# Patient Record
Sex: Male | Born: 1960
Health system: Southern US, Community
[De-identification: ages and names within clinical notes are randomized; demographics above are authoritative.]

## PROBLEM LIST (undated history)

## (undated) DIAGNOSIS — R7303 Prediabetes: Secondary | ICD-10-CM

## (undated) DIAGNOSIS — K219 Gastro-esophageal reflux disease without esophagitis: Secondary | ICD-10-CM

## (undated) DIAGNOSIS — I10 Essential (primary) hypertension: Secondary | ICD-10-CM

## (undated) DIAGNOSIS — N400 Enlarged prostate without lower urinary tract symptoms: Secondary | ICD-10-CM

## (undated) DIAGNOSIS — I517 Cardiomegaly: Secondary | ICD-10-CM

## (undated) DIAGNOSIS — S83419A Sprain of medial collateral ligament of unspecified knee, initial encounter: Secondary | ICD-10-CM

## (undated) DIAGNOSIS — E039 Hypothyroidism, unspecified: Secondary | ICD-10-CM

## (undated) HISTORY — DX: Sprain of medial collateral ligament of unspecified knee, initial encounter: S83.419A

---

## 1997-03-07 HISTORY — PX: MEDIAL COLLATERAL LIGAMENT REPAIR, KNEE: SHX2019

## 2005-02-18 ENCOUNTER — Emergency Department: Payer: Self-pay | Admitting: Emergency Medicine

## 2005-09-30 ENCOUNTER — Emergency Department: Payer: Self-pay | Admitting: Unknown Physician Specialty

## 2006-10-05 ENCOUNTER — Ambulatory Visit: Payer: Self-pay | Admitting: Family Medicine

## 2014-05-06 ENCOUNTER — Ambulatory Visit: Payer: Self-pay | Admitting: Physician Assistant

## 2014-07-01 ENCOUNTER — Other Ambulatory Visit: Payer: Self-pay | Admitting: Physician Assistant

## 2014-07-01 DIAGNOSIS — M25511 Pain in right shoulder: Secondary | ICD-10-CM

## 2014-07-09 ENCOUNTER — Ambulatory Visit
Admission: RE | Admit: 2014-07-09 | Discharge: 2014-07-09 | Disposition: A | Discharge status: Home / Self Care | Payer: Managed Care, Other (non HMO) | Source: Ambulatory Visit | Attending: Physician Assistant | Admitting: Physician Assistant

## 2014-07-09 DIAGNOSIS — M75121 Complete rotator cuff tear or rupture of right shoulder, not specified as traumatic: Secondary | ICD-10-CM | POA: Diagnosis not present

## 2014-07-09 DIAGNOSIS — M25511 Pain in right shoulder: Secondary | ICD-10-CM | POA: Diagnosis present

## 2014-07-25 NOTE — Discharge Instructions (Signed)

## 2014-07-28 DIAGNOSIS — Z791 Long term (current) use of non-steroidal anti-inflammatories (NSAID): Secondary | ICD-10-CM | POA: Diagnosis not present

## 2014-07-28 DIAGNOSIS — K640 First degree hemorrhoids: Secondary | ICD-10-CM | POA: Diagnosis not present

## 2014-07-28 DIAGNOSIS — I10 Essential (primary) hypertension: Secondary | ICD-10-CM | POA: Diagnosis not present

## 2014-07-28 DIAGNOSIS — E039 Hypothyroidism, unspecified: Secondary | ICD-10-CM | POA: Diagnosis not present

## 2014-07-28 DIAGNOSIS — F17200 Nicotine dependence, unspecified, uncomplicated: Secondary | ICD-10-CM | POA: Diagnosis not present

## 2014-07-28 DIAGNOSIS — N400 Enlarged prostate without lower urinary tract symptoms: Secondary | ICD-10-CM | POA: Diagnosis not present

## 2014-07-28 DIAGNOSIS — Z79899 Other long term (current) drug therapy: Secondary | ICD-10-CM | POA: Diagnosis not present

## 2014-07-28 DIAGNOSIS — Z1211 Encounter for screening for malignant neoplasm of colon: Secondary | ICD-10-CM | POA: Diagnosis not present

## 2014-08-08 ENCOUNTER — Ambulatory Visit: Payer: Managed Care, Other (non HMO) | Admitting: Student in an Organized Health Care Education/Training Program

## 2014-08-08 ENCOUNTER — Encounter
Admission: RE | Disposition: A | Discharge status: Home / Self Care | Payer: Self-pay | Source: Ambulatory Visit | Attending: Gastroenterology

## 2014-08-08 ENCOUNTER — Ambulatory Visit
Admission: RE | Admit: 2014-08-08 | Discharge: 2014-08-08 | Disposition: A | Discharge status: Home / Self Care | Payer: Managed Care, Other (non HMO) | Source: Ambulatory Visit | Attending: Gastroenterology | Admitting: Gastroenterology

## 2014-08-08 DIAGNOSIS — K640 First degree hemorrhoids: Secondary | ICD-10-CM | POA: Insufficient documentation

## 2014-08-08 DIAGNOSIS — Z1211 Encounter for screening for malignant neoplasm of colon: Secondary | ICD-10-CM | POA: Diagnosis not present

## 2014-08-08 DIAGNOSIS — F17200 Nicotine dependence, unspecified, uncomplicated: Secondary | ICD-10-CM | POA: Insufficient documentation

## 2014-08-08 DIAGNOSIS — Z791 Long term (current) use of non-steroidal anti-inflammatories (NSAID): Secondary | ICD-10-CM | POA: Insufficient documentation

## 2014-08-08 DIAGNOSIS — Z79899 Other long term (current) drug therapy: Secondary | ICD-10-CM | POA: Insufficient documentation

## 2014-08-08 DIAGNOSIS — E039 Hypothyroidism, unspecified: Secondary | ICD-10-CM | POA: Insufficient documentation

## 2014-08-08 DIAGNOSIS — N400 Enlarged prostate without lower urinary tract symptoms: Secondary | ICD-10-CM | POA: Insufficient documentation

## 2014-08-08 DIAGNOSIS — I10 Essential (primary) hypertension: Secondary | ICD-10-CM | POA: Insufficient documentation

## 2014-08-08 HISTORY — DX: Benign prostatic hyperplasia without lower urinary tract symptoms: N40.0

## 2014-08-08 HISTORY — PX: COLONOSCOPY: SHX5424

## 2014-08-08 HISTORY — DX: Hypothyroidism, unspecified: E03.9

## 2014-08-08 HISTORY — DX: Essential (primary) hypertension: I10

## 2014-08-08 SURGERY — COLONOSCOPY
Anesthesia: Monitor Anesthesia Care | Wound class: Contaminated

## 2014-08-08 MED ORDER — LIDOCAINE HCL (CARDIAC) 20 MG/ML IV SOLN
INTRAVENOUS | Status: DC | PRN
  Administered 2014-08-08 (×2): 30 mg via INTRAVENOUS

## 2014-08-08 MED ORDER — ACETAMINOPHEN 160 MG/5ML PO SOLN: 325.0000 mg | ORAL | Status: DC | PRN

## 2014-08-08 MED ORDER — ACETAMINOPHEN 325 MG PO TABS: 325.0000 mg | ORAL_TABLET | ORAL | Status: DC | PRN

## 2014-08-08 MED ORDER — LACTATED RINGERS IV SOLN
INTRAVENOUS | Status: DC
  Administered 2014-08-08 (×2): via INTRAVENOUS

## 2014-08-08 MED ORDER — PROPOFOL 10 MG/ML IV BOLUS
INTRAVENOUS | Status: DC | PRN
  Administered 2014-08-08 (×10): 20 mg via INTRAVENOUS

## 2014-08-08 MED ORDER — STERILE WATER FOR IRRIGATION IR SOLN
Status: DC | PRN
  Administered 2014-08-08: 10:00:00

## 2014-08-08 SURGICAL SUPPLY — 28 items

## 2014-08-08 NOTE — Op Note (Signed)
[facility] Patient Name: Todd Burns Procedure Date: 08/08/2014 10:03 AM MRN: [id number] Account #: [id number] Date of Birth: 08-16-1960 Admit Type: Outpatient Age: 54 Room: [facility] OR ROOM 01 Gender: Male Note Status: Finalized Procedure:         Colonoscopy Indications:       Screening for colorectal malignant neoplasm Providers:         Lucilla Lame, MD Medicines:         Propofol per Anesthesia Complications:     No immediate complications. Procedure:         Pre-Anesthesia Assessment:                    - Prior to the procedure, a History and Physical was                     performed, and patient medications and allergies were                     reviewed. The patient's tolerance of previous anesthesia                     was also reviewed. The risks and benefits of the procedure                     and the sedation options and risks were discussed with the                     patient. All questions were answered, and informed consent                     was obtained. Prior Anticoagulants: The patient has taken                     no previous anticoagulant or antiplatelet agents. ASA                     Grade Assessment: II - A patient with mild systemic                     disease. After reviewing the risks and benefits, the                     patient was deemed in satisfactory condition to undergo                     the procedure.                    After obtaining informed consent, the colonoscope was                     passed under direct vision. Throughout the procedure, the                     patient's blood pressure, pulse, and oxygen saturations                     were monitored continuously. The was introduced through                     the anus and advanced to the the cecum, identified by                     appendiceal orifice and ileocecal valve. The  colonoscopy                     was performed without difficulty. The patient  tolerated                     the procedure well. The quality of the bowel preparation                     was excellent. Findings:      The perianal and digital rectal examinations were normal.      Non-bleeding internal hemorrhoids were found during retroflexion. The       hemorrhoids were Grade I (internal hemorrhoids that do not prolapse). Impression:        - Non-bleeding internal hemorrhoids.                    - No specimens collected. Recommendation:    - Repeat colonoscopy in 10 years for screening unless any                     change in family history or lower GI problems. Procedure Code(s): --- Professional ---                    [phone], Colonoscopy, flexible; diagnostic, including                     collection of specimen(s) by brushing or washing, when                     performed (separate procedure) Diagnosis Code(s): --- Professional ---                    Z12.11, Encounter for screening for malignant neoplasm of                     colon CPT copyright 2014 American Medical Association. All rights reserved. The codes documented in this report are preliminary and upon coder review may  be revised to meet current compliance requirements. Lucilla Lame, MD 08/08/2014 10:18:27 AM This report has been signed electronically. Number of Addenda: 0 Note Initiated On: 08/08/2014 10:03 AM Scope Withdrawal Time: 0 hours 6 minutes 37 seconds  Total Procedure Duration: 0 hours 9 minutes 37 seconds       [facility]

## 2014-08-08 NOTE — Anesthesia Postprocedure Evaluation (Signed)
  Anesthesia Post-op Note  Patient: Todd Burns  Procedure(s) Performed: Procedure(s): COLONOSCOPY (N/A)  Anesthesia type:MAC  Patient location: PACU  Post pain: Pain level controlled  Post assessment: Post-op Vital signs reviewed, Patient's Cardiovascular Status Stable, Respiratory Function Stable, Patent Airway and No signs of Nausea or vomiting  Post vital signs: Reviewed and stable  Last Vitals:  Filed Vitals:   08/08/14 1020  BP:   Pulse: 73  Temp: 36.2 C  Resp:     Level of consciousness: awake, alert  and patient cooperative  Complications: No apparent anesthesia complications

## 2014-08-08 NOTE — H&P (Signed)
  [facility]  [address] [place], [place] [address] Phone: [phone] Fax : [phone]  Primary Care Physician:  Christie Nottingham., MD Primary Gastroenterologist:  Dr. Allen Norris  Pre-Procedure History & Physical: HPI:  Todd Burns is a 54 y.o. male is here for a screening colonoscopy.   Past Medical History  Diagnosis Date  . Hypertension   . Hypothyroidism   . BPH (benign prostatic hyperplasia)     Past Surgical History  Procedure Laterality Date  . Medial collateral ligament repair, knee  1999    Prior to Admission medications   Medication Sig Start Date End Date Taking? Authorizing Provider  amLODipine (NORVASC) 5 MG tablet Take 5 mg by mouth every morning.   Yes Historical Provider, MD  atorvastatin (LIPITOR) 20 MG tablet Take 20 mg by mouth daily. PM   Yes Historical Provider, MD  finasteride (PROSCAR) 5 MG tablet Take 5 mg by mouth daily.   Yes Historical Provider, MD  fluticasone (FLONASE) 50 MCG/ACT nasal spray Place 1 spray into both nostrils 2 (two) times daily.   Yes Historical Provider, MD  ibuprofen (ADVIL,MOTRIN) 800 MG tablet Take 800 mg by mouth every 8 (eight) hours as needed.   Yes Historical Provider, MD  levothyroxine (SYNTHROID, LEVOTHROID) 175 MCG tablet Take 175 mcg by mouth one time only at 6 PM. AM   Yes Historical Provider, MD  losartan-hydrochlorothiazide (HYZAAR) 100-25 MG per tablet Take 1 tablet by mouth daily at 6 PM.    Yes Historical Provider, MD  tamsulosin (FLOMAX) 0.4 MG CAPS capsule Take 0.4 mg by mouth daily at 6 PM.    Yes Historical Provider, MD    Allergies as of 07/16/2014  . (Not on File)    History reviewed. No pertinent family history.  History   Social History  . Marital Status: Married    Spouse Name: N/A  . Number of Children: N/A  . Years of Education: N/A   Occupational History  . Not on file.   Social History Main Topics  . Smoking status: Current Every Day Smoker -- 1.00 packs/day for 15  years  . Smokeless tobacco: Not on file  . Alcohol Use: Yes  . Drug Use: Not on file  . Sexual Activity: Yes   Other Topics Concern  . Not on file   Social History Narrative    Review of Systems: See HPI, otherwise negative ROS  Physical Exam: BP 144/84 mmHg  Pulse 60  Temp(Src) 97.9 F (36.6 C) (Temporal)  Resp 16  Ht 5\' 7"  (1.702 m)  Wt 211 lb (95.709 kg)  BMI 33.04 kg/m2  SpO2 100% General:   Alert,  pleasant and cooperative in NAD Head:  Normocephalic and atraumatic. Neck:  Supple; no masses or thyromegaly. Lungs:  Clear throughout to auscultation.    Heart:  Regular rate and rhythm. Abdomen:  Soft, nontender and nondistended. Normal bowel sounds, without guarding, and without rebound.   Neurologic:  Alert and  oriented x4;  grossly normal neurologically.  Impression/Plan: Todd Burns is now here to undergo a screening colonoscopy.  Risks, benefits, and alternatives regarding colonoscopy have been reviewed with the patient.  Questions have been answered.  All parties agreeable.

## 2014-08-08 NOTE — Transfer of Care (Signed)
Immediate Anesthesia Transfer of Care Note  Patient: Todd Burns  Procedure(s) Performed: Procedure(s): COLONOSCOPY (N/A)  Patient Location: PACU  Anesthesia Type: MAC  Level of Consciousness: awake, alert  and patient cooperative  Airway and Oxygen Therapy: Patient Spontanous Breathing and Patient connected to supplemental oxygen  Post-op Assessment: Post-op Vital signs reviewed, Patient's Cardiovascular Status Stable, Respiratory Function Stable, Patent Airway and No signs of Nausea or vomiting  Post-op Vital Signs: Reviewed and stable  Complications: No apparent anesthesia complications

## 2014-08-08 NOTE — Anesthesia Preprocedure Evaluation (Signed)
Anesthesia Evaluation  Patient identified by MRN, date of birth, ID band  Reviewed: Allergy & Precautions, H&P , NPO status , Patient's Chart, lab work & pertinent test results  Airway Mallampati: II  TM Distance: >3 FB Neck ROM: full    Dental  (+) Missing, Poor Dentition   Pulmonary Current Smoker,    Pulmonary exam normal       Cardiovascular hypertension, Rhythm:regular Rate:Normal     Neuro/Psych    GI/Hepatic   Endo/Other  Hypothyroidism   Renal/GU      Musculoskeletal   Abdominal   Peds  Hematology   Anesthesia Other Findings   Reproductive/Obstetrics                             Anesthesia Physical Anesthesia Plan  ASA: II  Anesthesia Plan: MAC   Post-op Pain Management:    Induction:   Airway Management Planned:   Additional Equipment:   Intra-op Plan:   Post-operative Plan:   Informed Consent: I have reviewed the patients History and Physical, chart, labs and discussed the procedure including the risks, benefits and alternatives for the proposed anesthesia with the patient or authorized representative who has indicated his/her understanding and acceptance.     Plan Discussed with: CRNA  Anesthesia Plan Comments:         Anesthesia Quick Evaluation

## 2014-08-11 ENCOUNTER — Encounter: Payer: Self-pay | Admitting: Gastroenterology

## 2014-09-22 ENCOUNTER — Other Ambulatory Visit: Payer: Managed Care, Other (non HMO)

## 2014-09-22 NOTE — Patient Instructions (Signed)
  Your procedure is scheduled on: 10-01-14 Report to [facility] To find out your arrival time please call [phone] between 1PM - 3PM on 09-30-14 (TUESDAY)  Remember: Instructions that are not followed completely may result in serious medical risk, up to and including death, or upon the discretion of your surgeon and anesthesiologist your surgery may need to be rescheduled.    _X___ 1. Do not eat food or drink liquids after midnight. No gum chewing or hard candies.     _X___ 2. No Alcohol for 24 hours before or after surgery.   ____ 3. Bring all medications with you on the day of surgery if instructed.    _X___ 4. Notify your doctor if there is any change in your medical condition     (cold, fever, infections).     Do not wear jewelry, make-up, hairpins, clips or nail polish.  Do not wear lotions, powders, or perfumes. You may wear deodorant.  Do not shave 48 hours prior to surgery. Men may shave face and neck.  Do not bring valuables to the hospital.    [facility] is not responsible for any belongings or valuables.               Contacts, dentures or bridgework may not be worn into surgery.  Leave your suitcase in the car. After surgery it may be brought to your room.  For patients admitted to the hospital, discharge time is determined by your  treatment team.   Patients discharged the day of surgery will not be allowed to drive home.   Please read over the following fact sheets that you were given:    __X__ Take these medicines the morning of surgery with A SIP OF WATER:    1. AMLODIPINE  2. ATORVASTATIN  3.   4.  5.  6.  ____ Fleet Enema (as directed)   _X___ Use CHG Soap as directed  ____ Use inhalers on the day of surgery  ____ Stop metformin 2 days prior to surgery    ____ Take 1/2 of usual insulin dose the night before surgery and none on the morning of surgery.   ____ Stop Coumadin/Plavix/aspirin-N/A  _X___ Stop  Anti-inflammatories-STOP IBUPROFEN 7 DAYS PRIOR TO SURGERY- NO NSAIDS OR ASPIRIN PRODUCTS-TYLENOL OK   ____ Stop supplements until after surgery.    ____ Bring C-Pap to the hospital.

## 2014-09-23 ENCOUNTER — Encounter
Admission: RE | Admit: 2014-09-23 | Discharge: 2014-09-23 | Disposition: A | Discharge status: Home / Self Care | Payer: Managed Care, Other (non HMO) | Source: Ambulatory Visit | Attending: Anesthesiology | Admitting: Anesthesiology

## 2014-09-23 DIAGNOSIS — Z01810 Encounter for preprocedural cardiovascular examination: Secondary | ICD-10-CM | POA: Insufficient documentation

## 2014-09-23 LAB — POTASSIUM: Potassium: 3.3 mmol/L — ABNORMAL LOW (ref 3.5–5.1)

## 2014-09-26 NOTE — Pre-Procedure Instructions (Signed)
Spoke with dr Andree Elk about ekg-cardiac claearance requested.  Got pt appt at [facility] with dr Neoma Laming for 09-29-14 @ 10:15. Pt notified of this. Pt also with a K+ of 3.3. Faxed to dr Sanjuan Dame office and left message with tabitha

## 2014-09-29 ENCOUNTER — Encounter: Payer: Self-pay | Admitting: Internal Medicine

## 2014-09-30 ENCOUNTER — Encounter: Payer: Self-pay | Admitting: Internal Medicine

## 2014-10-01 ENCOUNTER — Encounter
Admission: RE | Disposition: A | Discharge status: Home / Self Care | Payer: Self-pay | Source: Ambulatory Visit | Attending: Specialist

## 2014-10-01 ENCOUNTER — Ambulatory Visit: Payer: Managed Care, Other (non HMO) | Admitting: Anesthesiology

## 2014-10-01 ENCOUNTER — Ambulatory Visit
Admission: RE | Admit: 2014-10-01 | Discharge: 2014-10-01 | Disposition: A | Discharge status: Home / Self Care | Payer: Managed Care, Other (non HMO) | Source: Ambulatory Visit | Attending: Specialist | Admitting: Specialist

## 2014-10-01 DIAGNOSIS — M7551 Bursitis of right shoulder: Secondary | ICD-10-CM | POA: Insufficient documentation

## 2014-10-01 DIAGNOSIS — F17200 Nicotine dependence, unspecified, uncomplicated: Secondary | ICD-10-CM | POA: Diagnosis not present

## 2014-10-01 DIAGNOSIS — M7541 Impingement syndrome of right shoulder: Secondary | ICD-10-CM | POA: Insufficient documentation

## 2014-10-01 DIAGNOSIS — I10 Essential (primary) hypertension: Secondary | ICD-10-CM | POA: Diagnosis not present

## 2014-10-01 DIAGNOSIS — Z79899 Other long term (current) drug therapy: Secondary | ICD-10-CM | POA: Insufficient documentation

## 2014-10-01 DIAGNOSIS — Z9889 Other specified postprocedural states: Secondary | ICD-10-CM

## 2014-10-01 DIAGNOSIS — M75101 Unspecified rotator cuff tear or rupture of right shoulder, not specified as traumatic: Secondary | ICD-10-CM | POA: Diagnosis present

## 2014-10-01 DIAGNOSIS — M75121 Complete rotator cuff tear or rupture of right shoulder, not specified as traumatic: Secondary | ICD-10-CM | POA: Insufficient documentation

## 2014-10-01 DIAGNOSIS — E039 Hypothyroidism, unspecified: Secondary | ICD-10-CM | POA: Insufficient documentation

## 2014-10-01 DIAGNOSIS — M1990 Unspecified osteoarthritis, unspecified site: Secondary | ICD-10-CM | POA: Diagnosis not present

## 2014-10-01 HISTORY — PX: SHOULDER ARTHROSCOPY WITH ROTATOR CUFF REPAIR: SHX5685

## 2014-10-01 LAB — POTASSIUM: Potassium: 3.6 mmol/L (ref 3.5–5.1)

## 2014-10-01 SURGERY — ARTHROSCOPY, SHOULDER, WITH ROTATOR CUFF REPAIR
Anesthesia: General | Site: Shoulder | Laterality: Right | Wound class: Clean

## 2014-10-01 MED ORDER — ROPIVACAINE HCL 2 MG/ML IJ SOLN
INTRAMUSCULAR | Status: AC
  Filled 2014-10-01: qty 40

## 2014-10-01 MED ORDER — PHENYLEPHRINE HCL 10 MG/ML IJ SOLN
INTRAMUSCULAR | Status: DC | PRN
  Administered 2014-10-01: 200 ug via INTRAVENOUS
  Administered 2014-10-01: 100 ug via INTRAVENOUS

## 2014-10-01 MED ORDER — ONDANSETRON HCL 4 MG/2ML IJ SOLN: 4.0000 mg | Freq: Once | INTRAMUSCULAR | Status: DC | PRN

## 2014-10-01 MED ORDER — CEFAZOLIN SODIUM-DEXTROSE 2-3 GM-% IV SOLR
2.0000 g | Freq: Once | INTRAVENOUS | Status: AC
  Administered 2014-10-01: 2 g via INTRAVENOUS

## 2014-10-01 MED ORDER — GABAPENTIN 400 MG PO CAPS: 400.0000 mg | ORAL_CAPSULE | Freq: Three times a day (TID) | ORAL | Status: DC

## 2014-10-01 MED ORDER — GLYCOPYRROLATE 0.2 MG/ML IJ SOLN
INTRAMUSCULAR | Status: DC | PRN
  Administered 2014-10-01 (×2): 0.6 mg via INTRAVENOUS

## 2014-10-01 MED ORDER — MORPHINE SULFATE 4 MG/ML IJ SOLN
INTRAMUSCULAR | Status: AC
  Filled 2014-10-01: qty 1

## 2014-10-01 MED ORDER — LIDOCAINE HCL (PF) 1 % IJ SOLN
INTRAMUSCULAR | Status: AC
  Filled 2014-10-01: qty 2

## 2014-10-01 MED ORDER — BUPIVACAINE-EPINEPHRINE (PF) 0.5% -1:200000 IJ SOLN
INTRAMUSCULAR | Status: AC
  Filled 2014-10-01: qty 30

## 2014-10-01 MED ORDER — FENTANYL CITRATE (PF) 100 MCG/2ML IJ SOLN: 25.0000 ug | INTRAMUSCULAR | Status: DC | PRN

## 2014-10-01 MED ORDER — HYDROMORPHONE HCL 1 MG/ML IJ SOLN: 0.2500 mg | INTRAMUSCULAR | Status: DC | PRN

## 2014-10-01 MED ORDER — GABAPENTIN 400 MG PO CAPS
400.0000 mg | ORAL_CAPSULE | Freq: Once | ORAL | Status: AC
  Administered 2014-10-01: 400 mg via ORAL

## 2014-10-01 MED ORDER — FAMOTIDINE 20 MG PO TABS
20.0000 mg | ORAL_TABLET | Freq: Once | ORAL | Status: AC
  Administered 2014-10-01: 20 mg via ORAL

## 2014-10-01 MED ORDER — NEOSTIGMINE METHYLSULFATE 10 MG/10ML IV SOLN
INTRAVENOUS | Status: DC | PRN
Start: 2014-10-01 — End: 2014-10-01
  Administered 2014-10-01: 3 mg via INTRAVENOUS

## 2014-10-01 MED ORDER — LACTATED RINGERS IV SOLN
INTRAVENOUS | Status: DC
  Administered 2014-10-01 (×2): via INTRAVENOUS

## 2014-10-01 MED ORDER — MIDAZOLAM HCL 2 MG/2ML IJ SOLN
INTRAMUSCULAR | Status: DC | PRN
  Administered 2014-10-01 (×2): 1 mg via INTRAVENOUS

## 2014-10-01 MED ORDER — ONDANSETRON HCL 4 MG/2ML IJ SOLN
INTRAMUSCULAR | Status: DC | PRN
  Administered 2014-10-01: 4 mg via INTRAVENOUS

## 2014-10-01 MED ORDER — BUPIVACAINE-EPINEPHRINE (PF) 0.25% -1:200000 IJ SOLN
INTRAMUSCULAR | Status: DC | PRN
  Administered 2014-10-01: 30 mL via PERINEURAL

## 2014-10-01 MED ORDER — CLOPIDOGREL BISULFATE 75 MG PO TABS: 75.0000 mg | ORAL_TABLET | Freq: Every day | ORAL | Status: DC

## 2014-10-01 MED ORDER — NEOMYCIN-POLYMYXIN B GU 40-200000 IR SOLN
Status: AC
  Filled 2014-10-01: qty 2

## 2014-10-01 MED ORDER — ROCURONIUM BROMIDE 100 MG/10ML IV SOLN
INTRAVENOUS | Status: DC | PRN
  Administered 2014-10-01: 50 mg via INTRAVENOUS

## 2014-10-01 MED ORDER — NEOMYCIN-POLYMYXIN B GU 40-200000 IR SOLN
Status: DC | PRN
  Administered 2014-10-01: 6 mL

## 2014-10-01 MED ORDER — MELOXICAM 7.5 MG PO TABS
ORAL_TABLET | ORAL | Status: AC
  Filled 2014-10-01: qty 2

## 2014-10-01 MED ORDER — FENTANYL CITRATE (PF) 100 MCG/2ML IJ SOLN
INTRAMUSCULAR | Status: DC | PRN
  Administered 2014-10-01 (×2): 50 ug via INTRAVENOUS

## 2014-10-01 MED ORDER — HYDROCODONE-ACETAMINOPHEN 7.5-325 MG PO TABS: 1.0000 | ORAL_TABLET | Freq: Four times a day (QID) | ORAL | Status: DC | PRN

## 2014-10-01 MED ORDER — MELOXICAM 7.5 MG PO TABS
15.0000 mg | ORAL_TABLET | Freq: Once | ORAL | Status: AC
  Administered 2014-10-01: 15 mg via ORAL

## 2014-10-01 MED ORDER — LIDOCAINE HCL (CARDIAC) 20 MG/ML IV SOLN
INTRAVENOUS | Status: DC | PRN
  Administered 2014-10-01: 40 mg via INTRAVENOUS

## 2014-10-01 MED ORDER — CEFAZOLIN SODIUM-DEXTROSE 2-3 GM-% IV SOLR
INTRAVENOUS | Status: AC
  Filled 2014-10-01: qty 50

## 2014-10-01 MED ORDER — BUPIVACAINE-EPINEPHRINE (PF) 0.25% -1:200000 IJ SOLN
INTRAMUSCULAR | Status: AC
  Filled 2014-10-01: qty 60

## 2014-10-01 MED ORDER — FAMOTIDINE 20 MG PO TABS
ORAL_TABLET | ORAL | Status: AC
  Filled 2014-10-01: qty 1

## 2014-10-01 MED ORDER — EPINEPHRINE HCL 1 MG/ML IJ SOLN
INTRAMUSCULAR | Status: AC
  Filled 2014-10-01: qty 1

## 2014-10-01 MED ORDER — GABAPENTIN 400 MG PO CAPS
ORAL_CAPSULE | ORAL | Status: AC
  Filled 2014-10-01: qty 1

## 2014-10-01 MED ORDER — EPINEPHRINE HCL 1 MG/ML IJ SOLN
INTRAMUSCULAR | Status: DC | PRN
  Administered 2014-10-01: 1 mg

## 2014-10-01 MED ORDER — PROPOFOL 10 MG/ML IV BOLUS
INTRAVENOUS | Status: DC | PRN
Start: 2014-10-01 — End: 2014-10-01
  Administered 2014-10-01: 150 mg via INTRAVENOUS

## 2014-10-01 MED ORDER — BUPIVACAINE-EPINEPHRINE 0.25% -1:200000 IJ SOLN
INTRAMUSCULAR | Status: DC | PRN
  Administered 2014-10-01: 30 mL

## 2014-10-01 SURGICAL SUPPLY — 66 items
ADAPTER IRRIG TUBE 2 SPIKE SOL (ADAPTER) ×4 IMPLANT
ANCHOR ALL-SUT Q-FIX 2.8 (Anchor) ×2 IMPLANT
BLADE AGGRESSIVE PLUS 4.0 (BLADE) ×2 IMPLANT
BLADE SURG SZ11 CARB STEEL (BLADE) ×2 IMPLANT
BUR AGGRESSIVE+ 5.5 (BURR) IMPLANT
BUR BR 5.5 12 FLUTE (BURR) IMPLANT
BUR RADIUS 4.0X18.5 (BURR) IMPLANT
BUR RADIUS 5.5 (BURR) IMPLANT
CANNULA 5.75X7 CRYSTAL CLEAR (CANNULA) ×2 IMPLANT
CANNULA 8.5X75 THRED (CANNULA) ×2 IMPLANT
CANNULA PARTIAL THREAD 2X7 (CANNULA) ×2 IMPLANT
CHLORAPREP W/TINT 26ML (MISCELLANEOUS) ×2 IMPLANT
CONNECTOR PERFECT PASSER (CONNECTOR) ×2 IMPLANT
COVER MAYO STAND STRL (DRAPES) ×2 IMPLANT
DRAPE IMP U-DRAPE 54X76 (DRAPES) ×2 IMPLANT
DRAPE SHEET LG 3/4 BI-LAMINATE (DRAPES) ×2 IMPLANT
DRAPE STERI 35X30 U-POUCH (DRAPES) ×2 IMPLANT
DRSG AQUACEL AG ADV 3.5X 4 (GAUZE/BANDAGES/DRESSINGS) ×2 IMPLANT
DRSG AQUACEL AG ADV 3.5X10 (GAUZE/BANDAGES/DRESSINGS) ×2 IMPLANT
GAUZE PETRO XEROFOAM 1X8 (MISCELLANEOUS) ×2 IMPLANT
GAUZE SPONGE 4X4 12PLY STRL (GAUZE/BANDAGES/DRESSINGS) ×2 IMPLANT
GLOVE SURG ORTHO 8.0 STRL STRW (GLOVE) ×2 IMPLANT
GOWN STRL REUS W/ TWL LRG LVL4 (GOWN DISPOSABLE) ×2 IMPLANT
GOWN STRL REUS W/TWL LRG LVL4 (GOWN DISPOSABLE) ×2
IV LACTATED RINGER IRRG 3000ML (IV SOLUTION) ×6
IV LR IRRIG 3000ML ARTHROMATIC (IV SOLUTION) ×6 IMPLANT
KIT RM TURNOVER STRD PROC AR (KITS) ×2 IMPLANT
KIT SHOULDER TRACTION (DRAPES) ×2 IMPLANT
KIT SUTURE 2.8 Q-FIX DISP (MISCELLANEOUS) ×2 IMPLANT
MANIFOLD NEPTUNE II (INSTRUMENTS) ×2 IMPLANT
MAT BLUE FLOOR 46X72 FLO (MISCELLANEOUS) ×2 IMPLANT
MULTI S KNOTLES FIXATION 5.5 (Orthopedic Implant) ×2 IMPLANT
NDL MAYO CATGUT SZ5 (NEEDLE) ×1
NDL SAFETY 18GX1.5 (NEEDLE) ×2 IMPLANT
NDL SUT 5 .5 CRC TPR PNT MAYO (NEEDLE) ×1 IMPLANT
NEEDLE SPNL 18GX3.5 QUINCKE PK (NEEDLE) ×2 IMPLANT
NS IRRIG 500ML POUR BTL (IV SOLUTION) ×2 IMPLANT
PACK ARTHROSCOPY SHOULDER (MISCELLANEOUS) ×2 IMPLANT
PASSER SUT CAPTURE FIRST (SUTURE) ×2 IMPLANT
SET TUBE SUCT SHAVER OUTFL 24K (TUBING) ×2 IMPLANT
SET TUBE TIP INTRA-ARTICULAR (MISCELLANEOUS) ×2 IMPLANT
SLING ULTRA II LG (MISCELLANEOUS) ×2 IMPLANT
SOL PREP PVP 2OZ (MISCELLANEOUS) ×2
SOLUTION PREP PVP 2OZ (MISCELLANEOUS) ×1 IMPLANT
STAPLER SKIN PROX 35W (STAPLE) ×2 IMPLANT
SUT ETH BLK MONO 3 0 FS 1 12/B (SUTURE) ×2 IMPLANT
SUT ETHIBOND 2-0  6-8 CP-2 (SUTURE) ×1
SUT ETHIBOND 2-0 6-8 CP-2 (SUTURE) ×1 IMPLANT
SUT ETHILON 3 0 FSLX (SUTURE) ×2 IMPLANT
SUT ORTHOCORD W/MULTIPK NDL (SUTURE) ×2 IMPLANT
SUT PDS PLUS 0 (SUTURE) ×1
SUT PDS PLUS AB 0 CT-2 (SUTURE) ×1 IMPLANT
SUT PERFECTPASSER WHITE CART (SUTURE) ×2 IMPLANT
SUT SMART STITCH CARTRIDGE (SUTURE) ×2 IMPLANT
SUT VIC AB 0 CT1 36 (SUTURE) ×2 IMPLANT
SUT VIC AB 2-0 CT2 27 (SUTURE) ×4 IMPLANT
SUT VIC AB 4-0 SH 27 (SUTURE) ×1
SUT VIC AB 4-0 SH 27XANBCTRL (SUTURE) ×1 IMPLANT
SUT VICRYL 3-0 27IN (SUTURE) ×2 IMPLANT
SUTURE MAGNUM WIRE 2X48 BLK (SUTURE) ×8 IMPLANT
SYR 20CC LL (SYRINGE) ×2 IMPLANT
SYR 30ML LL (SYRINGE) ×2 IMPLANT
SYR 50ML LL SCALE MARK (SYRINGE) ×2 IMPLANT
TUBING ARTHRO INFLOW-ONLY STRL (TUBING) ×2 IMPLANT
TUBING CONNECTING 10 (TUBING) ×2 IMPLANT
WAND HAND CNTRL MULTIVAC 90 (MISCELLANEOUS) ×2 IMPLANT

## 2014-10-01 NOTE — Discharge Instructions (Signed)
AMBULATORY SURGERY  DISCHARGE INSTRUCTIONS   1) The drugs that you were given will stay in your system until tomorrow so for the next 24 hours you should not:  A) Drive an automobile B) Make any legal decisions C) Drink any alcoholic beverage   2) You may resume regular meals tomorrow.  Today it is better to start with liquids and gradually work up to solid foods.  You may eat anything you prefer, but it is better to start with liquids, then soup and crackers, and gradually work up to solid foods.   3) Please notify your doctor immediately if you have any unusual bleeding, trouble breathing, redness and pain at the surgery site, drainage, fever, or pain not relieved by medication.    4) Additional Instructions:        Please contact your physician with any problems or Same Day Surgery at [phone], Monday through Friday 6 am to 4 pm, or [place] at [facility] number at [phone]. Sling Use After Injury or Surgery A sling is used after an injury or a surgery. The sling protects and keeps you from using the injured part. A sling will also give rest and support to the injured part. HOME CARE   Do not use your shoulder until told by your doctor.  Keep all doctor visits.  Put ice on the injured area.  Put ice in a plastic bag.  Place a towel between your skin and the bag.  Leave the ice on for 15-20 minutes, 03-04 times a day.  Pad your elbow if you have numbness in your fingers.  Keep your arm on your chest when lying down.  Wear your plaster splint (if given) until you see your doctor again. Rest it on nothing harder than a pillow for 24 hours.  Do not get your arm wet if you have a plaster splint.  Wear your elastic bandage (if given) as told by your doctor.  Only take medicine as told by your doctor.  Exercise your shoulder as told by your doctor. Stop exercising if you have pain. GET HELP RIGHT AWAY IF:   You have an increase in bruising,  puffiness (swelling), or pain.  You notice a blue color or coldness in your fingers.  Your pain is not helped by medicine  Any of your problems are getting worse. MAKE SURE YOU:  Understand these instructions.  Will watch your condition.  Will get help right away if you are not doing well or get worse. Document Released: 05/18/2009 Document Revised: 02/08/2012 Document Reviewed: 05/18/2009 [facility] Patient Information 2015 [place], [place]. This information is not intended to replace advice given to you by your health care provider. Make sure you discuss any questions you have with your health care provider.

## 2014-10-01 NOTE — Transfer of Care (Signed)
Immediate Anesthesia Transfer of Care Note  Patient: Todd Burns  Procedure(s) Performed: Procedure(s) with comments: SHOULDER ARTHROSCOPY WITH ROTATOR CUFF REPAIR (Right) - Mini open rotator cuff repair Arthroscopy  Distal clavicle excision Subacromial decompression  Patient Location: PACU  Anesthesia Type:General  Level of Consciousness: awake, sedated and patient cooperative  Airway & Oxygen Therapy: Patient Spontanous Breathing  Post-op Assessment: Report given to RN and Post -op Vital signs reviewed and stable  Post vital signs: Reviewed  Last Vitals:  Filed Vitals:   10/01/14 1056  BP: 130/70  Pulse: 64  Temp: 36.1 C  Resp: 16    Complications: No apparent anesthesia complications

## 2014-10-01 NOTE — H&P (Signed)
THE PATIENT WAS SEEN IN THE HOLDING AREA.  HISTORY, ALLERGIES, HOME MEDICATIONS AND OPERATIVE PROCEDURE WERE REVIEWED. RISKS AND BENEFITS OF SURGERY DISCUSSED WITH PATIENT AGAIN.  NO CHANGES FROM INITIAL HISTORY AND PHYSICAL NOTED.    

## 2014-10-01 NOTE — Op Note (Addendum)
10/01/2014  10:56 AM  PATIENT:  Todd Burns    PRE-OPERATIVE DIAGNOSIS:  FULL THICKNESS ROTATOR CUFF TEAR RIGHT SHOULDER, IMPINGEMENT, AC ARTHRITIS  POST-OPERATIVE DIAGNOSIS:  Same  PROCEDURE:  RIGHT SHOULDER ARTHROSCOPY WITH MINI OPEN ROTATOR CUFF REPAIR, ARTHROSCOPIC SUBACROMIAL DECOMPRESSION AND DISTAL CLAVICLE EXCISION.    SURGEON:  Park Breed, MD   .  ANESTHESIA:   General plus interscalene block  PREOPERATIVE INDICATIONS:  Todd Burns is a  54 y.o. male with a diagnosis of [place] who failed conservative measures and elected for surgical management.    The risks benefits and alternatives were discussed with the patient preoperatively including but not limited to the risks of infection, bleeding, nerve injury, cardiopulmonary complications, the need for revision surgery, among others, and the patient was willing to proceed.  OPERATIVE IMPLANTS: One Q fix and 1 multi fix ArthroCare anchors  OPERATIVE FINDINGS: The patient had severe subacromial bursitis and impingement. The anterior acromion was prominent. The ACjoint was arthritic. The glenohumeral joint was intact.  There was a large retracted rotator cuff tear with an L shape.  The long limb of the L was anterior and had to be fixed by convergence sutures.  OPERATIVE PROCEDURE: The patient was brought to the operating room where satisfactory general endotracheal and interscalene anesthesia were accomplished.The patient was turned into the lateral decubitus position and the shoulder was prepped and draped in a sterile fashion. Arthroscopy was carried out from a posterior portal with accessory sensory portals laterally and anteriorly. The  joint was examined first. The above findings were encountered. The motorized shaver was introduced anteriorly and the undersurface of the rotator cuff probed and lightly debrided. The biceps tendon was intact.  The labrum was mildly frayed.  The joint surfaces were  intact.   The labrum was trimmed up with the ArthroCare wand as well. The arthroscope was redirected into the subacromial space. There was severe bursitis which was resected with the motorized shaver and ArthroCare wand. The large bur was introduced from a posterior portal and the anterior acromion was debrided. The undersurface of the clavicle was debrided with the bur which was then reintroduced from an anterior portal and the remaining distal clavicle completely excised.  The rotator cuff tear was very complex and difficult to fully visualize with the arthroscope.  There was extensive thick bursal tissue, which interfered with visualization and it was also difficult to differentiate the 2 in places.  For this reason, I elected to perform a mini open repair so that I could get the best visualization.  The skin and subcutaneous tissue was infiltrated with quarter percent Marcaine and epinephrine, and a saber type incision made just off the lateral aspect of the acromion.  Dissection was carried out sharply through subcutaneous tissue.  The interval between the anterior and middle deltoid was opened and muscle splint bluntly.  Extensive, very thick bursal tissue was excised.  A operating room lamp was used to improve visualization deep into the joint.  The posterior and medial portions of the cuff were identified and freed up from scarring.  Multiple Magnum wire sutures were passed through the posterior portion of the cuff using the first pass device.  This allowed for traction on the cuff to bring it forward for visualization.  Multiple Magnum wire sutures were then used in a convergence technique starting at the medial apex of the tear extending out laterally.  This brought the posterior and anterior portions of the cuff together  nicely.  This left a smaller portion of cuff to repair to the footprint on the tuberosity.  The tuberosity was debrided with ArthroCare wand and bur.  Irrigation was carried out  throughout the joint.  A Q fix anchor was introduced medially and the 4 limbs of this device were brought up through the free edge of the remaining cuff.  A multi fix anchor was then drilled and inserted into the lateral portion of the tuberosity and the 4 limbs of the Q fix suture were passed through this.  The multi fix anchor was advanced and the sutures were pulled under tension, pulling the cuff laterally while the arm was abducted and weight reduced to 5 pounds from 12 pounds of traction.  Final seating of the multi fix was carried out, which provided excellent repair of the tendon.  Final photos were taken and the wound was irrigated.  The deltoid fascia was repaired with 0 Vicryls suture.  Subcutaneous tissue was repaired with 2-0 Vicryls.  The skin wounds were all closed with staples.  Aquacel dressings were applied.  Sponge and needle counts were correct.  TENS pads were applied.  A padded sling was applied. Patient was awakened and taken recovery in good condition.   Basil Dess.D.

## 2014-10-01 NOTE — Anesthesia Procedure Notes (Addendum)
Anesthesia Regional Block:  Interscalene brachial plexus block  Pre-Anesthetic Checklist: ,, timeout performed, Correct Patient, Correct Site, Correct Laterality, Correct Procedure, Correct Position, site marked, Risks and benefits discussed,  Surgical consent,  Pre-op evaluation,  At surgeon's request and post-op pain management  Laterality: Right  Prep: alcohol swabs       Needles:  Injection technique: Single-shot  Needle Type: Stimiplex     Needle Length: 5cm 5 cm Needle Gauge: 22 and 22 G    Additional Needles:  Procedures: nerve stimulator Interscalene brachial plexus block  Nerve Stimulator or Paresthesia:  Response: 80 mA, 2 cm  Additional Responses:   Narrative:  Start time: 10/01/2014 7:39 AM End time: 10/01/2014 7:46 AM  Performed by: With CRNAs   Additional Notes: Tolerated well and no pain or iv sx with injection or during procedure.  vsst   Procedure Name: Intubation Date/Time: 10/01/2014 7:55 AM Performed by: Courtney Paris Pre-anesthesia Checklist: Patient identified, Emergency Drugs available, Suction available, Patient being monitored and Timeout performed Patient Re-evaluated:Patient Re-evaluated prior to inductionOxygen Delivery Method: Circle system utilized Preoxygenation: Pre-oxygenation with 100% oxygen Intubation Type: Combination inhalational/ intravenous induction Ventilation: Mask ventilation without difficulty Laryngoscope Size: Miller, 3, Glidescope and 5 Grade View: Grade III Tube type: Oral Tube size: 7.5 mm Number of attempts: 2 Airway Equipment and Method: Stylet Placement Confirmation: ETT inserted through vocal cords under direct vision,  positive ETCO2,  CO2 detector and breath sounds checked- equal and bilateral Secured at: 22 cm Tube secured with: Tape Dental Injury: Teeth and Oropharynx as per pre-operative assessment  Difficulty Due To: Difficulty was anticipated, Difficult Airway- due to anterior larynx, Difficult Airway-  due to large tongue and Difficult Airway- due to reduced neck mobility Future Recommendations: Recommend- induction with short-acting agent, and alternative techniques readily available Comments: Dr. Andree Elk intubated with ease using Glide Scope. Pt. With short neck, anterior larynx poor dentation

## 2014-10-01 NOTE — Anesthesia Preprocedure Evaluation (Addendum)
Anesthesia Evaluation  Patient identified by MRN, date of birth, ID band Patient awake    Reviewed: Allergy & Precautions, H&P , NPO status , Patient's Chart, lab work & pertinent test results, reviewed documented beta blocker date and time   Airway Mallampati: III  TM Distance: >3 FB Neck ROM: full    Dental   Pulmonary Current Smoker,          Cardiovascular Exercise Tolerance: Good hypertension, Rate:Normal  Echo from dr. Chancy Milroy noted and based on eval., will proceed with above. Good LV systolic fn.    Neuro/Psych    GI/Hepatic   Endo/Other  Hypothyroidism   Renal/GU      Musculoskeletal   Abdominal   Peds  Hematology   Anesthesia Other Findings   Reproductive/Obstetrics                            Anesthesia Physical Anesthesia Plan  ASA: II  Anesthesia Plan: General ETT   Post-op Pain Management: MAC Combined w/ Regional for Post-op pain   Induction:   Airway Management Planned:   Additional Equipment:   Intra-op Plan:   Post-operative Plan:   Informed Consent: I have reviewed the patients History and Physical, chart, labs and discussed the procedure including the risks, benefits and alternatives for the proposed anesthesia with the patient or authorized representative who has indicated his/her understanding and acceptance.     Plan Discussed with: CRNA  Anesthesia Plan Comments:         Anesthesia Quick Evaluation

## 2014-10-02 NOTE — Anesthesia Postprocedure Evaluation (Signed)
  Anesthesia Post-op Note  Patient: Todd Burns  Procedure(s) Performed: Procedure(s) with comments: SHOULDER ARTHROSCOPY WITH ROTATOR CUFF REPAIR (Right) - Mini open rotator cuff repair Arthroscopy  Distal clavicle excision Subacromial decompression  Anesthesia type:General ETT  Patient location: PACU  Post pain: Pain level controlled  Post assessment: Post-op Vital signs reviewed, Patient's Cardiovascular Status Stable, Respiratory Function Stable, Patent Airway and No signs of Nausea or vomiting  Post vital signs: Reviewed and stable  Last Vitals:  Filed Vitals:   10/01/14 1215  BP: 162/79  Pulse: 55  Temp:   Resp:     Level of consciousness: awake, alert  and patient cooperative  Complications: No apparent anesthesia complications

## 2014-10-12 NOTE — Addendum Note (Signed)
Addendum  created 10/12/14 0950 by Molli Barrows, MD   Modules edited: Anesthesia Attestations

## 2014-12-19 ENCOUNTER — Encounter: Payer: Self-pay | Admitting: Internal Medicine

## 2015-01-05 ENCOUNTER — Encounter: Payer: Self-pay | Admitting: Internal Medicine

## 2015-01-13 ENCOUNTER — Other Ambulatory Visit: Payer: Self-pay | Admitting: Urology

## 2015-01-13 DIAGNOSIS — N400 Enlarged prostate without lower urinary tract symptoms: Secondary | ICD-10-CM

## 2015-01-14 ENCOUNTER — Telehealth: Payer: Self-pay | Admitting: Urology

## 2015-01-14 NOTE — Telephone Encounter (Signed)
Patient was to follow up in March 2016.  He has not been seen and he does not have a follow up appointment.  He needs an appointment.

## 2015-01-15 NOTE — Telephone Encounter (Signed)
Line busy

## 2015-04-12 ENCOUNTER — Other Ambulatory Visit: Payer: Self-pay | Admitting: Urology

## 2015-04-13 NOTE — Telephone Encounter (Signed)
LMOM

## 2015-04-14 NOTE — Telephone Encounter (Signed)
LMOM- will send a letter.  

## 2015-06-19 ENCOUNTER — Other Ambulatory Visit: Payer: Self-pay | Admitting: Urology

## 2016-07-11 DIAGNOSIS — I10 Essential (primary) hypertension: Secondary | ICD-10-CM | POA: Diagnosis not present

## 2016-07-19 DIAGNOSIS — E039 Hypothyroidism, unspecified: Secondary | ICD-10-CM | POA: Diagnosis not present

## 2016-07-19 DIAGNOSIS — I10 Essential (primary) hypertension: Secondary | ICD-10-CM | POA: Diagnosis not present

## 2016-08-11 DIAGNOSIS — R001 Bradycardia, unspecified: Secondary | ICD-10-CM | POA: Diagnosis not present

## 2016-08-11 DIAGNOSIS — F17210 Nicotine dependence, cigarettes, uncomplicated: Secondary | ICD-10-CM | POA: Diagnosis not present

## 2016-08-11 DIAGNOSIS — I10 Essential (primary) hypertension: Secondary | ICD-10-CM | POA: Diagnosis not present

## 2016-08-11 DIAGNOSIS — I517 Cardiomegaly: Secondary | ICD-10-CM | POA: Diagnosis not present

## 2017-03-22 ENCOUNTER — Ambulatory Visit: Payer: Managed Care, Other (non HMO) | Admitting: Family Medicine

## 2017-03-22 ENCOUNTER — Encounter: Payer: Self-pay | Admitting: Family Medicine

## 2017-03-22 VITALS — BP 151/75 | HR 60 | Temp 98.6°F | Resp 16 | Ht 67.0 in | Wt 218.0 lb

## 2017-03-22 DIAGNOSIS — N400 Enlarged prostate without lower urinary tract symptoms: Secondary | ICD-10-CM | POA: Insufficient documentation

## 2017-03-22 DIAGNOSIS — J310 Chronic rhinitis: Secondary | ICD-10-CM

## 2017-03-22 DIAGNOSIS — E039 Hypothyroidism, unspecified: Secondary | ICD-10-CM

## 2017-03-22 DIAGNOSIS — Z7689 Persons encountering health services in other specified circumstances: Secondary | ICD-10-CM | POA: Diagnosis not present

## 2017-03-22 DIAGNOSIS — J329 Chronic sinusitis, unspecified: Secondary | ICD-10-CM | POA: Diagnosis not present

## 2017-03-22 DIAGNOSIS — N401 Enlarged prostate with lower urinary tract symptoms: Secondary | ICD-10-CM

## 2017-03-22 DIAGNOSIS — R3914 Feeling of incomplete bladder emptying: Secondary | ICD-10-CM | POA: Diagnosis not present

## 2017-03-22 DIAGNOSIS — I10 Essential (primary) hypertension: Secondary | ICD-10-CM | POA: Diagnosis not present

## 2017-03-22 DIAGNOSIS — M25561 Pain in right knee: Secondary | ICD-10-CM

## 2017-03-22 DIAGNOSIS — Z9889 Other specified postprocedural states: Secondary | ICD-10-CM

## 2017-03-22 MED ORDER — FINASTERIDE 5 MG PO TABS: 5.0000 mg | ORAL_TABLET | Freq: Every day | ORAL | 0 refills | Status: DC

## 2017-03-22 MED ORDER — FLUTICASONE PROPIONATE 50 MCG/ACT NA SUSP: 2.0000 | Freq: Every day | NASAL | 3 refills | Status: DC

## 2017-03-22 MED ORDER — IPRATROPIUM BROMIDE 0.06 % NA SOLN: 2.0000 | Freq: Four times a day (QID) | NASAL | 0 refills | Status: DC

## 2017-03-22 MED ORDER — NAPROXEN 500 MG PO TABS: 500.0000 mg | ORAL_TABLET | Freq: Two times a day (BID) | ORAL | 0 refills | Status: DC

## 2017-03-22 NOTE — Patient Instructions (Addendum)
Thank you for coming to the office today.  1.  Refilled Finasteride today - 30 day supply, referral to Urology  [facility] -[address] floor [address],  [place]  [address] Phone: [phone]  2. For Blood Pressure -check at home 2-3 times a week, write down readings, confirm medicines, call us if need new rx on BP meds or other meds  - We can re-discuss after blood work  For sinuses  Start Atrovent nasal spray decongestant 2 sprays in each nostril up to 4 times daily for 7 days  Start nasal steroid Flonase 2 sprays in each nostril daily for 4-6 weeks, may repeat course seasonally or as needed   3.  - Possible to have degenerative arthritis in the bones of your knee as well  I do not think you have a torn meniscus or ligament - but you have history of this so it may be worth investigating further  Recommend trial of Anti-inflammatory with Naproxen (Naprosyn) 500mg  tabs - take one with food and plenty of water TWICE daily every day (breakfast and dinner), for next 2 to 4 weeks, then you may take only as needed - DO NOT TAKE any ibuprofen, aleve, motrin while you are taking this medicine - It is safe to take Tylenol Ext Str 500mg  tabs - take 1 to 2 (max dose 1000mg ) every 6 hours as needed for breakthrough pain, max 24 hour daily dose is 6 to 8 tablets or 4000mg   Use RICE therapy: - R - Rest / relative rest with activity modification avoid overuse and frequent bending or pressure on bent knee - I - Ice packs (make sure you use a towel or sock / something to protect skin) - C - Compression with flexible Knee Sleeve to apply pressure and reduce swelling allowing more support - E - Elevation - if significant swelling, lift leg above heart level (toes above your nose) to help reduce swelling, most helpful at night after day of being on your feet   DUE for FASTING BLOOD WORK (no food or drink after midnight before the lab  appointment, only water or coffee without cream/sugar on the morning of)  SCHEDULE "Lab Only" visit in the morning at the clinic for lab draw in 4 WEEKS  - Make sure Lab Only appointment is at about 1 week before your next appointment, so that results will be available  For Lab Results, once available within 2-3 days of blood draw, you can can log in to MyChart online to view your results and a brief explanation. Also, we can discuss results at next follow-up visit.  Please schedule a Follow-up Appointment to: Return in about 4 weeks (around 04/19/2017) for Annual Physical (f/u R knee).    If you have any other questions or concerns, please feel free to call the office or send a message through [facility]. You may also schedule an earlier appointment if necessary.  Additionally, you may be receiving a survey about your experience at our office within a few days to 1 week by e-mail or mail. We value your feedback.  [name], DO [place]

## 2017-03-22 NOTE — Progress Notes (Signed)
Subjective:    Patient ID: Todd Burns, male    DOB: 1961/02/14, 57 y.o.   MRN: [id number]  Todd Burns is a 58 y.o. male presenting on 03/22/2017 for Establish Care (knee swollen Right side obtw has cold Sx onset 3 days sinus congestion )  Previously established with PCP in [place] at [facility] Dr Humphrey Rolls. He was discharged from their practice, and is unsure why. Here to establish with new PCP.  HPI   Right Knee Pain Reports symptoms started about 1 week ago with pain in back of Right knee and some swelling into knee in front by his report, he attributed this possibly to some new steel toed boots for Christmas. It does not seem to be getting worse. - Taking Ibuprofen 800mg  PRN with some relief - He has prior history of Right knee surgical repair of MCL and possibly meniscus injury performed years ago, 1999 on chart review - Denies any injury fall twisting injury  History of Shoulder Injury/Pain, s/p rotator cuff arthroscopic surgical repair (09/2014) He was followed by Dr Earnestine Leys through [facility] for prior shoulder surgery, he was treated with several different medications in past for management, including Gabapentin 400mg  TID but this was short-term only, DC'd due to side effect - Still admits to some shoulder stiffness  History of Tachycardia vs Abnormal Heart Beat - Reported history, unclear exact diagnosis, do not have cardiology record available. He was not aware of ever having dx of Atrial fibrillation. Prior to his shoulder surgery he had cardiac eval by Dr Humphrey Rolls (Cardiology) and found to have elevated heart rate or other related problem, and was started on Metoprolol, then after surgery he was found to have too low of a heart rate and or blood pressure, then taken off this med - He has not returned to Cardiology since  CHRONIC HTN: Reports treated for HTN for >20 years, on various medications. Reports that BP usually running high. Had history of headaches at onset of  symptoms. Current Meds - Amlodipine 5mg  daily (not taking currently, out of meds needs refill)   BPH and Incomplete bladder emptying Previously followed by [facility] >2 years ago. He was treated with Finasteride 5mg  and Tamsulosin 0.4mg  daily. However he describes still having significant symptoms that are bothering him, requesting referral back to Urology. - [place] if he voids while sitting on toilet sometimes has easier urination flow. Standing sometimes has a "pressure" seems difficulty in starting void. Worse in AM before takes pill in AM, then pressure improves.  [facility] BPH Symptom Score over past 1 month 1. Sensation of not emptying bladder post void - 5 2. Urinate less than 2 hour after finish last void - 1 3. Start/Stop several times during void - 3 4. Difficult to postpone urination - 5 5. Weak urinary stream - 2 (off medicine, but after med stronger stream) 6. Push or strain urination - 1 7. Nocturia - 0-1 times  Total Score: 17 (Moderate BPH symptoms) - on therapy  History of Elevated Cholesterol - Reports he is unsure why stopped cholesterol med, he did not have myalgias or intolerance he was taking Atorvastatin 20mg  daily. We do not have any recent lipid results for him.  Hypothyroidism (acquired) s/p History of Hyperthyroidism s/p treatment with radiation in 1990s Reports he has been taking variable doses of Levothyroxine since his surgery, often dose has increased, he is currently on Levothyroxine 157mcg daily, it has fluctuated. - He is unsure if currently working, he has  had some weight gain but also has been less active  Rhinosinusitis Reports new onset symptoms about 3 days ago, woke up with symptoms sinus congestion, no other sick symptoms, but this has been present for few days, has had similar before unsure if seasonal allergies as well. - Denies any cough, fever, sinus pain, sore throat  Health Maintenance: Due TDap, declines Due for Flu Shot, declines today  despite counseling on benefits Declines routine Hep C and HIV screening  Depression screen [facility] 2/9 03/22/2017  Decreased Interest 0  Down, Depressed, Hopeless 0  PHQ - 2 Score 0    Past Medical History:  Diagnosis Date  . BPH (benign prostatic hyperplasia)   . Hypertension   . Hypothyroidism    Past Surgical History:  Procedure Laterality Date  . COLONOSCOPY N/A 08/08/2014   Procedure: COLONOSCOPY;  Surgeon: Lucilla Lame, MD;  Location: [facility];  Service: Gastroenterology;  Laterality: N/A;  . MEDIAL COLLATERAL LIGAMENT REPAIR, KNEE  1999  . SHOULDER ARTHROSCOPY WITH ROTATOR CUFF REPAIR Right 10/01/2014   Procedure: SHOULDER ARTHROSCOPY WITH ROTATOR CUFF REPAIR;  Surgeon: Earnestine Leys, MD;  Location: [facility];  Service: Orthopedics;  Laterality: Right;  Mini open rotator cuff repair Arthroscopy  Distal clavicle excision Subacromial decompression   Social History   Socioeconomic History  . Marital status: Married    Spouse name: Not on file  . Number of children: Not on file  . Years of education: Not on file  . Highest education level: Not on file  Social Needs  . Financial resource strain: Not on file  . Food insecurity - worry: Not on file  . Food insecurity - inability: Not on file  . Transportation needs - medical: Not on file  . Transportation needs - non-medical: Not on file  Occupational History  . Not on file  Tobacco Use  . Smoking status: Current Every Day Smoker    Packs/day: 1.00    Years: 15.00    Pack years: 15.00  . Smokeless tobacco: Current User  Substance and Sexual Activity  . Alcohol use: Yes  . Drug use: No  . Sexual activity: Yes  Other Topics Concern  . Not on file  Social History Narrative  . Not on file   Family History  Problem Relation Age of Onset  . Cancer Mother        thyroid  . Cancer Father        lung  . Thyroid disease Brother   . Cancer Maternal Aunt        thyroid   Current Outpatient Medications on File  Prior to Visit  Medication Sig  . amLODipine (NORVASC) 5 MG tablet Take 5 mg by mouth every morning.  Marland Kitchen levothyroxine (SYNTHROID, LEVOTHROID) 175 MCG tablet Take 175 mcg by mouth one time only at 6 PM. AM  . losartan-hydrochlorothiazide (HYZAAR) 100-25 MG per tablet Take 1 tablet by mouth daily at 6 PM.   . tamsulosin (FLOMAX) 0.4 MG CAPS capsule Take 0.4 mg by mouth daily at 6 PM.   . ibuprofen (ADVIL,MOTRIN) 800 MG tablet ibuprofen 800 mg tablet   No current facility-administered medications on file prior to visit.     Review of Systems  Constitutional: Negative for activity change, appetite change, chills, diaphoresis, fatigue and fever.  HENT: Positive for congestion, postnasal drip and rhinorrhea. Negative for hearing loss, sinus pressure and sinus pain.   Eyes: Negative for visual disturbance.  Respiratory: Negative for apnea, cough, chest tightness, shortness  of breath and wheezing.   Cardiovascular: Negative for chest pain, palpitations and leg swelling.  Gastrointestinal: Negative for abdominal pain, anal bleeding, blood in stool, constipation, diarrhea, nausea and vomiting.  Endocrine: Negative for cold intolerance.  Genitourinary: Positive for difficulty urinating and urgency. Negative for decreased urine volume, dysuria, frequency, hematuria, penile swelling, scrotal swelling and testicular pain.  Musculoskeletal: Positive for arthralgias (R knee pain). Negative for back pain and neck pain.  Skin: Negative for rash.  Allergic/Immunologic: Positive for environmental allergies.  Neurological: Negative for dizziness, weakness, light-headedness, numbness and headaches.  Hematological: Negative for adenopathy.  Psychiatric/Behavioral: Negative for behavioral problems, dysphoric mood and sleep disturbance. The patient is not nervous/anxious.    Per HPI unless specifically indicated above    Objective:    BP (!) 151/75   Pulse 60   Temp 98.6 F (37 C) (Oral)   Resp 16   Ht 5'  7" (1.702 m)   Wt 218 lb (98.9 kg)   BMI 34.14 kg/m   Wt Readings from Last 3 Encounters:  03/22/17 218 lb (98.9 kg)  10/01/14 216 lb (98 kg)  08/08/14 211 lb (95.7 kg)    Physical Exam  Constitutional: He is oriented to person, place, and time. He appears well-developed and well-nourished. No distress.  Well-appearing, comfortable, cooperative  HENT:  Head: Normocephalic and atraumatic.  Mouth/Throat: Oropharynx is clear and moist.  Frontal / maxillary sinuses non-tender. Nares significant turbinate edema and erythema with some congestion without purulence. Bilateral TMs clear without erythema, effusion or bulging. Oropharynx with mild posterior pharyngeal drainage without erythema, exudates, edema or asymmetry.  Eyes: Conjunctivae are normal. Right eye exhibits no discharge. Left eye exhibits no discharge.  Neck: Normal range of motion. Neck supple. No thyromegaly present.  Cardiovascular: Normal rate, regular rhythm, normal heart sounds and intact distal pulses.  No murmur heard. Pulmonary/Chest: Effort normal and breath sounds normal. No respiratory distress. He has no wheezes. He has no rales.  Musculoskeletal: Normal range of motion. He exhibits no edema.  Right Knee Inspection: Normal appearance and symmetrical. No ecchymosis or effusion. Palpation: Mild tender posterior R knee not significantly provoked on palpation, mostly with ROM joint line non tender. No significant crepitus ROM: Slightly limited active ROM full ext and flexion cause discomfort but range is intact Special Testing: Standing Thessaly for meniscus testing was negative for meniscal provoking pain on rotation but has some discomfort with standing weightbearing on R knee Strength: 5/5 intact knee flex/ext, ankle dorsi/plantarflex Neurovascular: distally intact sensation light touch and pulses  Lymphadenopathy:    He has no cervical adenopathy.  Neurological: He is alert and oriented to person, place, and time.    Skin: Skin is warm and dry. No rash noted. He is not diaphoretic. No erythema.  Psychiatric: He has a normal mood and affect. His behavior is normal.  Well groomed, good eye contact, normal speech and thoughts  Nursing note and vitals reviewed.  Results for orders placed or performed during the hospital encounter of 10/01/14  Potassium  Result Value Ref Range   Potassium 3.6 3.5 - 5.1 mmol/L      Assessment & Plan:   Problem List Items Addressed This Visit    Acute pain of right knee    Acute R posterior Knee pain and swelling anteriorly without known injury or trauma . Presumed strain vs overuse - Suspected likely due to known underlying osteoarthritis / DJD with known OA/DJD in other joints. - History of R knee MCL repair 1999 -  Concern for possible degenerative meniscus however exam not supportive, location of pain possibly baker's cyst but limited evidence of this on palpation with exam - Able to bear weight, no knee instability or mechanical locking  Plan: 1. Stop Ibuprofen PRN - Start anti-inflammatory trial with rx Naprosyn 500mg  BID wc x 2-4 weeks, then PRN 2. Start Tylenol 500-1000mg  per dose TID PRN breakthrough 3. RICE therapy (rest, ice, compression, elevation) for swelling, activity modification 4. Hold imaging unlikely fracture based on mechanism 5. Follow-up 4-6 weeks, if still worsening, consider R knee x-ray, steroid injection and referral back to [facility] for further eval      Relevant Medications   naproxen (NAPROSYN) 500 MG tablet   BPH (benign prostatic hyperplasia) - Primary    Chronic BPH with some persistent refractory symptoms LUTS - [facility] BPH score 17 (03/23/17), no comparison - On Finasteride 5mg , Tamsulosin 0.4mg  - Last PSA uncertain, no result available - No known personal/family history of prostate CA  Plan: 1. Referral back to [facility] for further evaluation now with refractory LUTS despite dual therapy Finasteride and Tamsulosin, may need  Urodynamic testing to see if other bladder etiology involved as well - Refilled Finasteride 5mg  daily since out currently - Continue Tamsulosin 0.4mg  daily      Relevant Medications   finasteride (PROSCAR) 5 MG tablet   Other Relevant Orders   Ambulatory referral to Urology   Hypertension    Mildly elevated initial BP, repeat manual check with only minor improvement. He is off anti-HTN meds, ran out. - Home BP readings none available  Uncertain if known complications - awaiting record, no labs   Plan:  1. Continue current BP regimen - restart previous med Amlodipine 5mg  daily (he has rx at pharmacy now has not started) 2. Encourage improved lifestyle - low sodium diet, regular exercise 3. Start monitor BP outside office, bring readings to next visit, if persistently >140/90 or new symptoms notify office sooner 4. Follow-up 4 weeks annual phys + labs      Hypothyroidism    Uncertain current level of control hypothyroid now, has been taking levothyroxine, has new meds at pharmacy unsure about adherence - No TSH in >1-2 years by report - Awaiting records  PLan: 1. Continue Levothyroxine 188mcg daily - he will notify if checks bottles at home and is due for refill 2. Check thyroid tests - TSH Free T4 with upcoming annual labs      S/P rotator cuff surgery    Stable chronic problem Has some residual stiffness S/p surgery 2016       Other Visit Diagnoses    Encounter to establish care with new doctor       Rhinosinusitis       Likely related to seasonal/allergies, or could be early viral URI, trial on Atrovent short-term, then Flonase longer   Relevant Medications   ipratropium (ATROVENT) 0.06 % nasal spray   fluticasone (FLONASE) 50 MCG/ACT nasal spray      Meds ordered this encounter  Medications  . finasteride (PROSCAR) 5 MG tablet    Sig: Take 1 tablet (5 mg total) by mouth daily.    Dispense:  30 tablet    Refill:  0  . ipratropium (ATROVENT) 0.06 % nasal spray     Sig: Place 2 sprays into both nostrils 4 (four) times daily. For up to 5-7 days then stop.    Dispense:  15 mL    Refill:  0  . fluticasone (FLONASE) 50 MCG/ACT  nasal spray    Sig: Place 2 sprays into both nostrils daily. Use for 4-6 weeks then stop and use seasonally or as needed.    Dispense:  16 g    Refill:  3  . naproxen (NAPROSYN) 500 MG tablet    Sig: Take 1 tablet (500 mg total) by mouth 2 (two) times daily with a meal. For 2-4 weeks then as needed    Dispense:  60 tablet    Refill:  0   Orders Placed This Encounter  Procedures  . Ambulatory referral to Urology    Referral Priority:   Routine    Referral Type:   Consultation    Referral Reason:   Specialty Services Required    Requested Specialty:   Urology    Number of Visits Requested:   1    Follow up plan: Return in about 4 weeks (around 04/19/2017) for Annual Physical (f/u R knee).  Future labs ordered for 04/2017. Note PSA was deferred to Urology.  [name], [facility] 03/23/2017, 7:18 AM

## 2017-03-23 ENCOUNTER — Other Ambulatory Visit: Payer: Self-pay | Admitting: Family Medicine

## 2017-03-23 DIAGNOSIS — E782 Mixed hyperlipidemia: Secondary | ICD-10-CM | POA: Insufficient documentation

## 2017-03-23 DIAGNOSIS — Z9889 Other specified postprocedural states: Secondary | ICD-10-CM | POA: Insufficient documentation

## 2017-03-23 DIAGNOSIS — I10 Essential (primary) hypertension: Secondary | ICD-10-CM

## 2017-03-23 DIAGNOSIS — Z0000 Encounter for general adult medical examination without abnormal findings: Secondary | ICD-10-CM

## 2017-03-23 DIAGNOSIS — E039 Hypothyroidism, unspecified: Secondary | ICD-10-CM

## 2017-03-23 DIAGNOSIS — R3914 Feeling of incomplete bladder emptying: Secondary | ICD-10-CM

## 2017-03-23 DIAGNOSIS — M25561 Pain in right knee: Secondary | ICD-10-CM

## 2017-03-23 DIAGNOSIS — N401 Enlarged prostate with lower urinary tract symptoms: Secondary | ICD-10-CM

## 2017-03-23 DIAGNOSIS — G8929 Other chronic pain: Secondary | ICD-10-CM | POA: Insufficient documentation

## 2017-03-23 NOTE — Assessment & Plan Note (Signed)
Chronic BPH with some persistent refractory symptoms LUTS - [facility] BPH score 17 (03/23/17), no comparison - On Finasteride 5mg , Tamsulosin 0.4mg  - Last PSA uncertain, no result available - No known personal/family history of prostate CA  Plan: 1. Referral back to [facility] for further evaluation now with refractory LUTS despite dual therapy Finasteride and Tamsulosin, may need Urodynamic testing to see if other bladder etiology involved as well - Refilled Finasteride 5mg  daily since out currently - Continue Tamsulosin 0.4mg  daily

## 2017-03-23 NOTE — Assessment & Plan Note (Signed)
Acute R posterior Knee pain and swelling anteriorly without known injury or trauma . Presumed strain vs overuse - Suspected likely due to known underlying osteoarthritis / DJD with known OA/DJD in other joints. - History of R knee MCL repair 1999 - Concern for possible degenerative meniscus however exam not supportive, location of pain possibly baker's cyst but limited evidence of this on palpation with exam - Able to bear weight, no knee instability or mechanical locking  Plan: 1. Stop Ibuprofen PRN - Start anti-inflammatory trial with rx Naprosyn 500mg  BID wc x 2-4 weeks, then PRN 2. Start Tylenol 500-1000mg  per dose TID PRN breakthrough 3. RICE therapy (rest, ice, compression, elevation) for swelling, activity modification 4. Hold imaging unlikely fracture based on mechanism 5. Follow-up 4-6 weeks, if still worsening, consider R knee x-ray, steroid injection and referral back to [facility] for further eval

## 2017-03-23 NOTE — Assessment & Plan Note (Signed)
Mildly elevated initial BP, repeat manual check with only minor improvement. He is off anti-HTN meds, ran out. - Home BP readings none available  Uncertain if known complications - awaiting record, no labs   Plan:  1. Continue current BP regimen - restart previous med Amlodipine 5mg  daily (he has rx at pharmacy now has not started) 2. Encourage improved lifestyle - low sodium diet, regular exercise 3. Start monitor BP outside office, bring readings to next visit, if persistently >140/90 or new symptoms notify office sooner 4. Follow-up 4 weeks annual phys + labs

## 2017-03-23 NOTE — Assessment & Plan Note (Signed)
Uncertain current level of control hypothyroid now, has been taking levothyroxine, has new meds at pharmacy unsure about adherence - No TSH in >1-2 years by report - Awaiting records  PLan: 1. Continue Levothyroxine 132mcg daily - he will notify if checks bottles at home and is due for refill 2. Check thyroid tests - TSH Free T4 with upcoming annual labs

## 2017-03-23 NOTE — Assessment & Plan Note (Signed)
Stable chronic problem Has some residual stiffness S/p surgery 2016

## 2017-04-12 ENCOUNTER — Ambulatory Visit: Payer: BLUE CROSS/BLUE SHIELD | Admitting: Urology

## 2017-04-12 ENCOUNTER — Encounter: Payer: Self-pay | Admitting: Urology

## 2017-04-13 ENCOUNTER — Other Ambulatory Visit: Payer: BLUE CROSS/BLUE SHIELD

## 2017-04-13 DIAGNOSIS — E782 Mixed hyperlipidemia: Secondary | ICD-10-CM

## 2017-04-13 DIAGNOSIS — E039 Hypothyroidism, unspecified: Secondary | ICD-10-CM | POA: Diagnosis not present

## 2017-04-13 DIAGNOSIS — I10 Essential (primary) hypertension: Secondary | ICD-10-CM | POA: Diagnosis not present

## 2017-04-13 DIAGNOSIS — Z0000 Encounter for general adult medical examination without abnormal findings: Secondary | ICD-10-CM | POA: Diagnosis not present

## 2017-04-14 LAB — CBC WITH DIFFERENTIAL/PLATELET
Basophils Absolute: 87 cells/uL (ref 0–200)
Basophils Relative: 1.5 %
Eosinophils Absolute: 180 cells/uL (ref 15–500)
Eosinophils Relative: 3.1 %
HCT: 39.5 % (ref 38.5–50.0)
Hemoglobin: 13.6 g/dL (ref 13.2–17.1)
Lymphs Abs: 1955 cells/uL (ref 850–3900)
MCH: 28.7 pg (ref 27.0–33.0)
MCHC: 34.4 g/dL (ref 32.0–36.0)
MCV: 83.3 fL (ref 80.0–100.0)
MPV: 12.1 fL (ref 7.5–12.5)
Monocytes Relative: 15.5 %
Neutro Abs: 2680 cells/uL (ref 1500–7800)
Neutrophils Relative %: 46.2 %
Platelets: 195 10*3/uL (ref 140–400)
RBC: 4.74 10*6/uL (ref 4.20–5.80)
RDW: 15 % (ref 11.0–15.0)
Total Lymphocyte: 33.7 %
WBC mixed population: 899 cells/uL (ref 200–950)
WBC: 5.8 10*3/uL (ref 3.8–10.8)

## 2017-04-14 LAB — COMPLETE METABOLIC PANEL WITH GFR
AG Ratio: 1.2 (calc) (ref 1.0–2.5)
ALT: 17 U/L (ref 9–46)
AST: 28 U/L (ref 10–35)
Albumin: 3.9 g/dL (ref 3.6–5.1)
Alkaline phosphatase (APISO): 53 U/L (ref 40–115)
BUN: 19 mg/dL (ref 7–25)
CO2: 27 mmol/L (ref 20–32)
Calcium: 9.4 mg/dL (ref 8.6–10.3)
Chloride: 104 mmol/L (ref 98–110)
Creat: 1.1 mg/dL (ref 0.70–1.33)
GFR, Est African American: 86 mL/min/{1.73_m2} (ref >=60)
GFR, Est Non African American: 74 mL/min/{1.73_m2} (ref >=60)
Globulin: 3.3 g/dL (calc) (ref 1.9–3.7)
Glucose, Bld: 97 mg/dL (ref 65–99)
Potassium: 3.7 mmol/L (ref 3.5–5.3)
Sodium: 138 mmol/L (ref 135–146)
Total Bilirubin: 0.5 mg/dL (ref 0.2–1.2)
Total Protein: 7.2 g/dL (ref 6.1–8.1)

## 2017-04-14 LAB — HEMOGLOBIN A1C
Hgb A1c MFr Bld: 5.8 % of total Hgb — ABNORMAL HIGH (ref <5.7)
Mean Plasma Glucose: 120 (calc)
eAG (mmol/L): 6.6 (calc)

## 2017-04-14 LAB — LIPID PANEL
Cholesterol: 229 mg/dL — ABNORMAL HIGH (ref <200)
HDL: 41 mg/dL (ref >40)
LDL Cholesterol (Calc): 162 mg/dL (calc) — ABNORMAL HIGH
Non-HDL Cholesterol (Calc): 188 mg/dL (calc) — ABNORMAL HIGH (ref <130)
Total CHOL/HDL Ratio: 5.6 (calc) — ABNORMAL HIGH (ref <5.0)
Triglycerides: 137 mg/dL (ref <150)

## 2017-04-14 LAB — T4, FREE: Free T4: 1.5 ng/dL (ref 0.8–1.8)

## 2017-04-14 LAB — TSH: TSH: 15.46 mIU/L — ABNORMAL HIGH (ref 0.40–4.50)

## 2017-04-15 ENCOUNTER — Encounter: Payer: Self-pay | Admitting: Family Medicine

## 2017-04-15 DIAGNOSIS — R7303 Prediabetes: Secondary | ICD-10-CM | POA: Insufficient documentation

## 2017-04-18 ENCOUNTER — Other Ambulatory Visit: Payer: Self-pay | Admitting: Family Medicine

## 2017-04-18 DIAGNOSIS — R3914 Feeling of incomplete bladder emptying: Principal | ICD-10-CM

## 2017-04-18 DIAGNOSIS — N401 Enlarged prostate with lower urinary tract symptoms: Secondary | ICD-10-CM

## 2017-04-19 ENCOUNTER — Encounter: Payer: Self-pay | Admitting: Family Medicine

## 2017-04-19 ENCOUNTER — Ambulatory Visit
Admission: RE | Admit: 2017-04-19 | Discharge: 2017-04-19 | Disposition: A | Discharge status: Home / Self Care | Payer: BLUE CROSS/BLUE SHIELD | Source: Ambulatory Visit | Attending: Family Medicine | Admitting: Family Medicine

## 2017-04-19 ENCOUNTER — Ambulatory Visit (INDEPENDENT_AMBULATORY_CARE_PROVIDER_SITE_OTHER): Payer: BLUE CROSS/BLUE SHIELD | Admitting: Family Medicine

## 2017-04-19 VITALS — BP 133/78 | HR 70 | Temp 98.4°F | Resp 16 | Ht 67.0 in | Wt 216.0 lb

## 2017-04-19 DIAGNOSIS — Z0001 Encounter for general adult medical examination with abnormal findings: Secondary | ICD-10-CM

## 2017-04-19 DIAGNOSIS — M1711 Unilateral primary osteoarthritis, right knee: Secondary | ICD-10-CM

## 2017-04-19 DIAGNOSIS — I10 Essential (primary) hypertension: Secondary | ICD-10-CM

## 2017-04-19 DIAGNOSIS — Z0000 Encounter for general adult medical examination without abnormal findings: Secondary | ICD-10-CM

## 2017-04-19 DIAGNOSIS — M25461 Effusion, right knee: Secondary | ICD-10-CM | POA: Diagnosis not present

## 2017-04-19 DIAGNOSIS — E039 Hypothyroidism, unspecified: Secondary | ICD-10-CM

## 2017-04-19 DIAGNOSIS — E782 Mixed hyperlipidemia: Secondary | ICD-10-CM | POA: Diagnosis not present

## 2017-04-19 DIAGNOSIS — R7309 Other abnormal glucose: Secondary | ICD-10-CM

## 2017-04-19 DIAGNOSIS — M25561 Pain in right knee: Secondary | ICD-10-CM

## 2017-04-19 MED ORDER — LIDOCAINE HCL (PF) 1 % IJ SOLN
4.0000 mL | Freq: Once | INTRAMUSCULAR | Status: AC
  Administered 2017-04-19: 4 mL

## 2017-04-19 MED ORDER — LEVOTHYROXINE SODIUM 175 MCG PO TABS: 175.0000 ug | ORAL_TABLET | Freq: Every day | ORAL | 3 refills | Status: DC

## 2017-04-19 MED ORDER — NAPROXEN 500 MG PO TABS: 500.0000 mg | ORAL_TABLET | Freq: Two times a day (BID) | ORAL | 0 refills | Status: DC

## 2017-04-19 MED ORDER — METHYLPREDNISOLONE ACETATE 40 MG/ML IJ SUSP
40.0000 mg | Freq: Once | INTRAMUSCULAR | Status: AC
  Administered 2017-04-19: 40 mg via INTRA_ARTICULAR

## 2017-04-19 MED ORDER — BACLOFEN 10 MG PO TABS: 5.0000 mg | ORAL_TABLET | Freq: Three times a day (TID) | ORAL | 0 refills | Status: DC | PRN

## 2017-04-19 NOTE — Progress Notes (Signed)
Subjective:    Patient ID: Todd Burns, male    DOB: 09-Apr-1960, 57 y.o.   MRN: [id number]  Todd Burns is a 57 y.o. male presenting on 04/19/2017 for Annual Exam and Knee Pain   HPI   Here for Annual Physical and Lab Review.   Hypothyroidism (acquired) s/p History of Hyperthyroidism s/p treatment with radiation in 1990s Reports he has been taking variable doses of Levothyroxine since his surgery, initially had been on 125 then increase gradually - Today reviewed recent lab with TSH >15, but Free T4 normal 1.5. He is still taking Levothyroxine 187mcg daily, has not missed any doses - He can normally tell if low thyroid hormone  Seasonal Allergies Recent worsening with sneezing and congestion Using Flonase  Right Knee Pain - Last visit with me 03/22/17, for initial visit for same problem R Knee pain, treated with switch ibuprofen to Naproxen and Tylenol, RICE therapy and sleeve, see prior notes for background information. - Interval update with initial improvement with less range of motion and swelling, and  - Today patient reports still persistent pain, now more knee lateral and posterior , some swelling intermittent improve - taking naproxen 500 BID with relief, not taking anything during afternoon at work, not using Tylenol, worse after prolonged sitting will be stiff - Wearing copperfit sleeve some improvement, reduced swelling - He has prior history of Right knee surgical repair of MCL and possibly meniscus injury performed years ago, 1999 on chart review - No lower calf or leg swelling, no history of DVT - Denies any injury fall twisting injury  HYPERLIPIDEMIA / Lifestyle - Reports no concerns. Last lipid panel 04/2017, abnormal with elevated LDL - No longer on statin, previously on Atrovastatin 20mg  but stopped unsure why was told on too many medicines Active playing with kids, no regular exercise No diet Not had beer since 2 weeks. He has occasional beer.  CHRONIC  HTN: Reports not checking BP at home. Current Meds - Amlodipine 5mg  - also now reports history of Losartan-HCTZ 100-25mg  Reports good compliance, took meds today. Tolerating well, w/o complaints.  Health Maintenance: Declines vaccines Flu, TDap Declines Hep C and HIV routine screening UTD on Colonoscopy  Prostate CA Screening - pending further evaluation by [facility], no PSA drawn with last labs.  Depression screen [facility] 2/9 04/19/2017 03/22/2017  Decreased Interest 0 0  Down, Depressed, Hopeless 0 0  PHQ - 2 Score 0 0    Past Medical History:  Diagnosis Date  . BPH (benign prostatic hyperplasia)   . Hypertension   . Hypothyroidism    Past Surgical History:  Procedure Laterality Date  . COLONOSCOPY N/A 08/08/2014   Procedure: COLONOSCOPY;  Surgeon: Lucilla Lame, MD;  Location: [facility];  Service: Gastroenterology;  Laterality: N/A;  . MEDIAL COLLATERAL LIGAMENT REPAIR, KNEE  1999  . SHOULDER ARTHROSCOPY WITH ROTATOR CUFF REPAIR Right 10/01/2014   Procedure: SHOULDER ARTHROSCOPY WITH ROTATOR CUFF REPAIR;  Surgeon: Earnestine Leys, MD;  Location: [facility];  Service: Orthopedics;  Laterality: Right;  Mini open rotator cuff repair Arthroscopy  Distal clavicle excision Subacromial decompression   Social History   Socioeconomic History  . Marital status: Married    Spouse name: Not on file  . Number of children: Not on file  . Years of education: Not on file  . Highest education level: Not on file  Social Needs  . Financial resource strain: Not on file  . Food insecurity - worry: Not on file  .  Food insecurity - inability: Not on file  . Transportation needs - medical: Not on file  . Transportation needs - non-medical: Not on file  Occupational History  . Not on file  Tobacco Use  . Smoking status: Current Every Day Smoker    Packs/day: 1.00    Years: 15.00    Pack years: 15.00  . Smokeless tobacco: Current User  Substance and Sexual Activity  . Alcohol use: Yes   . Drug use: No  . Sexual activity: Yes  Other Topics Concern  . Not on file  Social History Narrative  . Not on file   Family History  Problem Relation Age of Onset  . Cancer Mother        thyroid  . Cancer Father        lung  . Thyroid disease Brother   . Cancer Maternal Aunt        thyroid   Current Outpatient Medications on File Prior to Visit  Medication Sig  . amLODipine (NORVASC) 5 MG tablet Take 5 mg by mouth every morning.  . finasteride (PROSCAR) 5 MG tablet TAKE 1 TABLET BY MOUTH EVERY DAY  . fluticasone (FLONASE) 50 MCG/ACT nasal spray Place 2 sprays into both nostrils daily. Use for 4-6 weeks then stop and use seasonally or as needed.  Marland Kitchen ibuprofen (ADVIL,MOTRIN) 800 MG tablet ibuprofen 800 mg tablet  . ipratropium (ATROVENT) 0.06 % nasal spray Place 2 sprays into both nostrils 4 (four) times daily. For up to 5-7 days then stop.  Marland Kitchen losartan-hydrochlorothiazide (HYZAAR) 100-25 MG per tablet Take 1 tablet by mouth daily at 6 PM.   . tamsulosin (FLOMAX) 0.4 MG CAPS capsule Take 0.4 mg by mouth daily at 6 PM.    No current facility-administered medications on file prior to visit.     Review of Systems  Constitutional: Negative for activity change, appetite change, chills, diaphoresis, fatigue and fever.  HENT: Positive for congestion. Negative for hearing loss and sinus pressure.   Eyes: Negative for visual disturbance.  Respiratory: Negative for apnea, cough, choking, chest tightness, shortness of breath and wheezing.   Cardiovascular: Negative for chest pain, palpitations and leg swelling.  Gastrointestinal: Negative for abdominal pain, anal bleeding, blood in stool, constipation, diarrhea, nausea and vomiting.  Endocrine: Negative for cold intolerance and polyuria.  Genitourinary: Negative for decreased urine volume, difficulty urinating, dysuria, frequency, hematuria, testicular pain and urgency.  Musculoskeletal: Positive for arthralgias (R knee pain), gait  problem (some occasional limping due to R knee pain and stiffness) and joint swelling (R knee, improved). Negative for back pain, myalgias and neck pain.  Skin: Negative for rash.  Allergic/Immunologic: Positive for environmental allergies.  Neurological: Negative for dizziness, weakness, light-headedness, numbness and headaches.  Hematological: Negative for adenopathy.  Psychiatric/Behavioral: Negative for behavioral problems, dysphoric mood and sleep disturbance. The patient is not nervous/anxious.    Per HPI unless specifically indicated above     Objective:    BP 133/78   Pulse 70   Temp 98.4 F (36.9 C) (Oral)   Resp 16   Ht 5\' 7"  (1.702 m)   Wt 216 lb (98 kg)   BMI 33.83 kg/m   Wt Readings from Last 3 Encounters:  04/19/17 216 lb (98 kg)  03/22/17 218 lb (98.9 kg)  10/01/14 216 lb (98 kg)    Physical Exam  Constitutional: He is oriented to person, place, and time. He appears well-developed and well-nourished. No distress.  Well-appearing, mostly comfortable except R knee, cooperative  HENT:  Head: Normocephalic and atraumatic.  Mouth/Throat: Oropharynx is clear and moist.  Frontal / maxillary sinuses non-tender. Nares with turbinate edema with some congestion without purulence. Bilateral TMs clear without erythema, effusion or bulging. Oropharynx clear without erythema, exudates, edema or asymmetry.  Eyes: Conjunctivae and EOM are normal. Pupils are equal, round, and reactive to light. Right eye exhibits no discharge. Left eye exhibits no discharge.  Neck: Normal range of motion. Neck supple. No thyromegaly present.  Cardiovascular: Normal rate, regular rhythm, normal heart sounds and intact distal pulses.  No murmur heard. Pulmonary/Chest: Effort normal and breath sounds normal. No respiratory distress. He has no wheezes. He has no rales.  Abdominal: Soft. Bowel sounds are normal. He exhibits no distension and no mass. There is no tenderness.  Musculoskeletal:  Upper /  Lower Extremities: - Normal muscle tone, strength bilateral upper extremities 5/5, lower extremities 5/5  Bilateral calves 35-37 cm circumference  Right Knee Inspection: Somewhat bulky appearance and symmetrical. Mild palpable joint effusion without ecchymosis or erythema Palpation: Mild tender R lateral knee joint space and posterior. Audible snapping crepitus on flexion and some palpable fine crepitus more apparent on exam today, L knee has mild crepitus ROM: Slightly limited active ROM full ext and flexion cause discomfort but range is intact Special Testing: Standing Thessaly for meniscus testing was now positive for pain on R lateral knee meniscus pain, last time eval test was negative in January 2019  Strength: 5/5 intact knee flex/ext, ankle dorsi/plantarflex Neurovascular: distally intact sensation light touch and pulses   Lymphadenopathy:    He has no cervical adenopathy.  Neurological: He is alert and oriented to person, place, and time.  Distal sensation intact to light touch all extremities  Skin: Skin is warm and dry. No rash noted. He is not diaphoretic. No erythema.  Psychiatric: He has a normal mood and affect. His behavior is normal.  Well groomed, good eye contact, normal speech and thoughts  Nursing note and vitals reviewed.  ________________________________________________________ PROCEDURE NOTE Date: 04/19/17 Right Knee steroid injection Discussed benefits and risks (including pain, bleeding, infection, steroid flare). Verbal consent given by patient. Medication:  1 cc Depo-medrol 40mg  and 4 cc Lidocaine 1% without epi Time Out taken  Landmarks identified. Area cleansed with alcohol wipes.Using 21 gauge and 1, 1/2 inch needle, Right knee joint was injected (with above listed medication) via lateral approach cold spray used for superficial anesthetic.Sterile bandage placed.Patient tolerated procedure well without bleeding or paresthesias.No  complications.   Results for orders placed or performed in visit on 04/13/17  TSH  Result Value Ref Range   TSH 15.46 (H) 0.40 - 4.50 mIU/L  Lipid panel  Result Value Ref Range   Cholesterol 229 (H) <200 mg/dL   HDL 41 >40 mg/dL   Triglycerides 137 <150 mg/dL   LDL Cholesterol (Calc) 162 (H) mg/dL (calc)   Total CHOL/HDL Ratio 5.6 (H) <5.0 (calc)   Non-HDL Cholesterol (Calc) 188 (H) <130 mg/dL (calc)  CBC with Differential/Platelet  Result Value Ref Range   WBC 5.8 3.8 - 10.8 Thousand/uL   RBC 4.74 4.20 - 5.80 Million/uL   Hemoglobin 13.6 13.2 - 17.1 g/dL   HCT 39.5 38.5 - 50.0 %   MCV 83.3 80.0 - 100.0 fL   MCH 28.7 27.0 - 33.0 pg   MCHC 34.4 32.0 - 36.0 g/dL   RDW 15.0 11.0 - 15.0 %   Platelets 195 140 - 400 Thousand/uL   MPV 12.1 7.5 - 12.5 fL  Neutro Abs 2,680 1,500 - 7,800 cells/uL   Lymphs Abs 1,955 850 - 3,900 cells/uL   WBC mixed population 899 200 - 950 cells/uL   Eosinophils Absolute 180 15 - 500 cells/uL   Basophils Absolute 87 0 - 200 cells/uL   Neutrophils Relative % 46.2 %   Total Lymphocyte 33.7 %   Monocytes Relative 15.5 %   Eosinophils Relative 3.1 %   Basophils Relative 1.5 %  Hemoglobin A1c  Result Value Ref Range   Hgb A1c MFr Bld 5.8 (H) <5.7 % of total Hgb   Mean Plasma Glucose 120 (calc)   eAG (mmol/L) 6.6 (calc)  COMPLETE METABOLIC PANEL WITH GFR  Result Value Ref Range   Glucose, Bld 97 65 - 99 mg/dL   BUN 19 7 - 25 mg/dL   Creat 1.10 0.70 - 1.33 mg/dL   GFR, Est Non African American 74 > OR = 60 mL/min/1.68m2   GFR, Est African American 86 > OR = 60 mL/min/1.19m2   BUN/Creatinine Ratio NOT APPLICABLE 6 - 22 (calc)   Sodium 138 135 - 146 mmol/L   Potassium 3.7 3.5 - 5.3 mmol/L   Chloride 104 98 - 110 mmol/L   CO2 27 20 - 32 mmol/L   Calcium 9.4 8.6 - 10.3 mg/dL   Total Protein 7.2 6.1 - 8.1 g/dL   Albumin 3.9 3.6 - 5.1 g/dL   Globulin 3.3 1.9 - 3.7 g/dL (calc)   AG Ratio 1.2 1.0 - 2.5 (calc)   Total Bilirubin 0.5 0.2 - 1.2 mg/dL    Alkaline phosphatase (APISO) 53 40 - 115 U/L   AST 28 10 - 35 U/L   ALT 17 9 - 46 U/L  T4, free  Result Value Ref Range   Free T4 1.5 0.8 - 1.8 ng/dL      Assessment & Plan:   Problem List Items Addressed This Visit    Acute pain of right knee    Persistent R lateral and posterior Knee pain seems swelling intermittent improve - Suspected likely due to known underlying osteoarthritis / DJD with known OA/DJD in other joints. - History of R knee MCL repair 1999 - Concern for possible degenerative meniscus still now since limited improvement Not clinically consistent with DVT, no lower extremity other symptoms, seems more MSK, Low Risk Wells score  Plan: 1. Steroid knee injection today - see procedure note - Resume NSAID naproxen, add Baclofen PRN caution sedation do not take when working 2. Start Tylenol 500-1000mg  per dose TID PRN breakthrough 3. RICE therapy (rest, ice, compression, elevation) for swelling, activity modification 4. Check Knee X-ray today 5. Follow-up 4-6 weeks, if still worsening referral back to [facility] for further eval      Relevant Medications   lidocaine (PF) (XYLOCAINE) 1 % injection 4 mL (Completed)   methylPREDNISolone acetate (DEPO-MEDROL) injection 40 mg (Completed)   naproxen (NAPROSYN) 500 MG tablet   baclofen (LIORESAL) 10 MG tablet   Other Relevant Orders   DG Knee Complete 4 Views Right   Elevated hemoglobin A1c    Elevated A1c 5.8 concern new dx PreDM No prior A1c readings  Plan:  1. Not on any therapy currently  2. Encourage improved lifestyle - low carb, low sugar diet, reduce portion size, continue improving regular exercise 3. Follow-up 6 mo A1c      Hypertension    Controlled HTN - Home BP readings none available  No known complications, Cr stable   Plan:  1. Continue current BP regimen -  Amlodipine 5mg  - will review other prior rx Losartan-HCTZ 100-25 in future, did not realize before this apt he was on this, presume it is  old rx 2. Encourage improved lifestyle - low sodium diet, regular exercise 3. Start monitor BP outside office, bring readings to next visit, if persistently >140/90 or new symptoms notify office sooner 4. Follow-up 6 months, sooner if BP uncontrol      Hypothyroidism    TSH >15, but normal Free T4 Uncertain why TSH is still significantly elevated S/p thyroid surgery  Plan: 1. Continue Levothyroxine 151mcg daily refill - Consider future Endocrinology referral if needed - Follow-up q 6 month repeat TSH/Free T4 adjust if need      Relevant Medications   levothyroxine (SYNTHROID, LEVOTHROID) 175 MCG tablet   Mixed hyperlipidemia    Uncontrolled cholesterol no longer on statin Last lipid panel 04/2017 Calculated ASCVD 10 yr risk score elevated risk 18.4% as smoker  Plan: Encourage improved lifestyle - low carb/cholesterol, reduce portion size, continue improving regular exercise Follow-up lipids yearly       Other Visit Diagnoses    Annual physical exam    -  Primary HM UTD Improve lifestyle diet / exercise, counseling    Primary osteoarthritis of right knee       Relevant Medications   methylPREDNISolone acetate (DEPO-MEDROL) injection 40 mg (Completed)   naproxen (NAPROSYN) 500 MG tablet   baclofen (LIORESAL) 10 MG tablet   Other Relevant Orders   DG Knee Complete 4 Views Right      Meds ordered this encounter  Medications  . lidocaine (PF) (XYLOCAINE) 1 % injection 4 mL  . methylPREDNISolone acetate (DEPO-MEDROL) injection 40 mg  . naproxen (NAPROSYN) 500 MG tablet    Sig: Take 1 tablet (500 mg total) by mouth 2 (two) times daily with a meal. For 2-4 weeks then as needed    Dispense:  60 tablet    Refill:  0  . baclofen (LIORESAL) 10 MG tablet    Sig: Take 0.5-1 tablets (5-10 mg total) by mouth 3 (three) times daily as needed for muscle spasms.    Dispense:  30 each    Refill:  0  . levothyroxine (SYNTHROID, LEVOTHROID) 175 MCG tablet    Sig: Take 1 tablet (175  mcg total) by mouth daily before breakfast.    Dispense:  90 tablet    Refill:  3     Follow up plan: Return in about 6 months (around 10/17/2017) for Elevated A1c (PreDM) / Thyroid Lab review / lifestyle.  Future labs ordered 10/05/17  [name], [facility] 04/20/2017, 1:19 AM

## 2017-04-19 NOTE — Patient Instructions (Addendum)
Thank you for coming to the office today.  1.  You received a Right Knee Joint steroid injection today. - Lidocaine numbing medicine may ease the pain initially for a few hours until it wears off - As discussed, you may experience a "steroid flare" this evening or within 24-48 hours, anytime medicine is injected into an inflamed joint it can cause the pain to get worse temporarily - Everyone responds differently to these injections, it depends on the patient and the severity of the joint problem, it may provide anywhere from days to weeks, to months of relief. Ideal response is >6 months relief - Try to take it easy for next 1-2 days, avoid over activity and strain on joint (limit walking for knee) - Recommend the following:   - For swelling - rest, compression sleeve / ACE wrap, elevation, and ice packs as needed for first few days   - For pain in future may use heating pad or moist heat as needed  Medication Continue Naproxen - refill sent - take twice daily with food for 2-4 more weeks  Start taking Baclofen (Lioresal) 10mg  (muscle relaxant) - start with half (cut) to one whole pill at night as needed for next 1-3 nights (may make you drowsy, caution with driving) see how it affects you, then if tolerated increase to one pill 2 to 3 times a day or (every 8 hours as needed)  Take Tylenol in afternoon while at work - 500 to 1000mg  per dose  - Given the nature of the R knee pain, and duration of your pain/injury, I am concerned for damaged meniscus cartilage knee. If not improving as expected over next several weeks, please follow-up sooner for re-evaluation.  Leg cramps - Try spoonful of yellow mustard to relieve leg cramps or try daily to prevent the problem  - OTC natural option is Hyland's Leg Cramps (Dissolving tablet) take as needed for muscle cramps  Work on improving diet as discussed, review handout, lower portion size and reduce carb/starches   Please schedule a Follow-up  Appointment to: Return in about 6 months (around 10/17/2017) for Elevated A1c (PreDM) / Thyroid Lab review / lifestyle.  If you have any other questions or concerns, please feel free to call the office or send a message through [place]. You may also schedule an earlier appointment if necessary.  Additionally, you may be receiving a survey about your experience at our office within a few days to 1 week by e-mail or mail. We value your feedback.  [name], DO [place]

## 2017-04-20 ENCOUNTER — Other Ambulatory Visit: Payer: Self-pay | Admitting: Family Medicine

## 2017-04-20 DIAGNOSIS — E039 Hypothyroidism, unspecified: Secondary | ICD-10-CM

## 2017-04-20 DIAGNOSIS — I10 Essential (primary) hypertension: Secondary | ICD-10-CM

## 2017-04-20 DIAGNOSIS — R7309 Other abnormal glucose: Secondary | ICD-10-CM

## 2017-04-20 NOTE — Assessment & Plan Note (Signed)
Uncontrolled cholesterol no longer on statin Last lipid panel 04/2017 Calculated ASCVD 10 yr risk score elevated risk 18.4% as smoker  Plan: Encourage improved lifestyle - low carb/cholesterol, reduce portion size, continue improving regular exercise Follow-up lipids yearly

## 2017-04-20 NOTE — Assessment & Plan Note (Signed)
TSH >15, but normal Free T4 Uncertain why TSH is still significantly elevated S/p thyroid surgery  Plan: 1. Continue Levothyroxine 143mcg daily refill - Consider future Endocrinology referral if needed - Follow-up q 6 month repeat TSH/Free T4 adjust if need

## 2017-04-20 NOTE — Assessment & Plan Note (Signed)
Elevated A1c 5.8 concern new dx PreDM No prior A1c readings  Plan:  1. Not on any therapy currently  2. Encourage improved lifestyle - low carb, low sugar diet, reduce portion size, continue improving regular exercise 3. Follow-up 6 mo A1c

## 2017-04-20 NOTE — Assessment & Plan Note (Signed)
Persistent R lateral and posterior Knee pain seems swelling intermittent improve - Suspected likely due to known underlying osteoarthritis / DJD with known OA/DJD in other joints. - History of R knee MCL repair 1999 - Concern for possible degenerative meniscus still now since limited improvement Not clinically consistent with DVT, no lower extremity other symptoms, seems more MSK, Low Risk Wells score  Plan: 1. Steroid knee injection today - see procedure note - Resume NSAID naproxen, add Baclofen PRN caution sedation do not take when working 2. Start Tylenol 500-1000mg  per dose TID PRN breakthrough 3. RICE therapy (rest, ice, compression, elevation) for swelling, activity modification 4. Check Knee X-ray today 5. Follow-up 4-6 weeks, if still worsening referral back to [facility] for further eval

## 2017-04-20 NOTE — Progress Notes (Signed)
m °

## 2017-04-20 NOTE — Assessment & Plan Note (Signed)
Controlled HTN - Home BP readings none available  No known complications, Cr stable   Plan:  1. Continue current BP regimen - Amlodipine 5mg  - will review other prior rx Losartan-HCTZ 100-25 in future, did not realize before this apt he was on this, presume it is old rx 2. Encourage improved lifestyle - low sodium diet, regular exercise 3. Start monitor BP outside office, bring readings to next visit, if persistently >140/90 or new symptoms notify office sooner 4. Follow-up 6 months, sooner if BP uncontrol

## 2017-05-05 ENCOUNTER — Encounter: Payer: Self-pay | Admitting: Urology

## 2017-05-05 ENCOUNTER — Ambulatory Visit (INDEPENDENT_AMBULATORY_CARE_PROVIDER_SITE_OTHER): Payer: BLUE CROSS/BLUE SHIELD | Admitting: Urology

## 2017-05-05 VITALS — BP 185/87 | HR 64 | Resp 16 | Ht 68.0 in | Wt 217.4 lb

## 2017-05-05 DIAGNOSIS — N401 Enlarged prostate with lower urinary tract symptoms: Secondary | ICD-10-CM

## 2017-05-05 DIAGNOSIS — R3911 Hesitancy of micturition: Secondary | ICD-10-CM

## 2017-05-05 LAB — URINALYSIS, COMPLETE
Bilirubin, UA: NEGATIVE
Glucose, UA: NEGATIVE
Ketones, UA: NEGATIVE
Leukocytes, UA: NEGATIVE
Nitrite, UA: NEGATIVE
Protein, UA: NEGATIVE
Specific Gravity, UA: 1.01 (ref 1.005–1.030)
Urobilinogen, Ur: 0.2 mg/dL (ref 0.2–1.0)
pH, UA: 7 (ref 5.0–7.5)

## 2017-05-05 LAB — MICROSCOPIC EXAMINATION
Bacteria, UA: NONE SEEN
Epithelial Cells (non renal): NONE SEEN /hpf (ref 0–10)
WBC, UA: NONE SEEN /hpf (ref 0–?)

## 2017-05-05 LAB — BLADDER SCAN AMB NON-IMAGING: Scan Result: 51

## 2017-05-05 MED ORDER — TAMSULOSIN HCL 0.4 MG PO CAPS: 0.4000 mg | ORAL_CAPSULE | Freq: Two times a day (BID) | ORAL | 1 refills | Status: DC

## 2017-05-05 NOTE — Progress Notes (Signed)
05/05/2017 3:58 PM   Todd Burns Mar 26, 1960 [id number]  Referring provider: Olin Hauser, DO [address] [place], [place] [address]  Chief Complaint  Patient presents with  . New Patient (Initial Visit)    BPH    HPI: Todd Burns is a 57 year old male seen at the request of Dr. Parks Ranger for evaluation of lower urinary tract symptoms.  He was started on tamsulosin and finasteride approximately 3 years ago for lower urinary tract symptoms.  Over the past several months he states later in the day he develops urinary hesitancy, decreased force and caliber of his urinary stream, intermittent urinary stream with postvoid dribbling.  He denies dysuria or gross hematuria.  He denies flank, abdominal, pelvic or scrotal pain.  There is no recent PSA on chart review.  He denies family history of prostate cancer.   PMH: Past Medical History:  Diagnosis Date  . BPH (benign prostatic hyperplasia)   . Hypertension   . Hypothyroidism     Surgical History: Past Surgical History:  Procedure Laterality Date  . COLONOSCOPY N/A 08/08/2014   Procedure: COLONOSCOPY;  Surgeon: Lucilla Lame, MD;  Location: [facility];  Service: Gastroenterology;  Laterality: N/A;  . MEDIAL COLLATERAL LIGAMENT REPAIR, KNEE  1999  . SHOULDER ARTHROSCOPY WITH ROTATOR CUFF REPAIR Right 10/01/2014   Procedure: SHOULDER ARTHROSCOPY WITH ROTATOR CUFF REPAIR;  Surgeon: Earnestine Leys, MD;  Location: [facility];  Service: Orthopedics;  Laterality: Right;  Mini open rotator cuff repair Arthroscopy  Distal clavicle excision Subacromial decompression    Home Medications:  Allergies as of 05/05/2017      Reactions   Lisinopril Other (See Comments), Cough   MUCUS      Medication List        Accurate as of 05/05/17  3:58 PM. Always use your most recent med list.          amLODipine 5 MG tablet Commonly known as:  NORVASC Take 5 mg by mouth every morning.   finasteride 5 MG tablet Commonly known as:   PROSCAR TAKE 1 TABLET BY MOUTH EVERY DAY   fluticasone 50 MCG/ACT nasal spray Commonly known as:  FLONASE Place 2 sprays into both nostrils daily. Use for 4-6 weeks then stop and use seasonally or as needed.   ibuprofen 800 MG tablet Commonly known as:  ADVIL,MOTRIN ibuprofen 800 mg tablet   ipratropium 0.06 % nasal spray Commonly known as:  ATROVENT Place 2 sprays into both nostrils 4 (four) times daily. For up to 5-7 days then stop.   levothyroxine 175 MCG tablet Commonly known as:  SYNTHROID, LEVOTHROID Take 1 tablet (175 mcg total) by mouth daily before breakfast.   losartan-hydrochlorothiazide 100-25 MG tablet Commonly known as:  HYZAAR Take 1 tablet by mouth daily at 6 PM.   naproxen 500 MG tablet Commonly known as:  NAPROSYN Take 1 tablet (500 mg total) by mouth 2 (two) times daily with a meal. For 2-4 weeks then as needed   tamsulosin 0.4 MG Caps capsule Commonly known as:  FLOMAX Take 0.4 mg by mouth daily at 6 PM.       Allergies:  Allergies  Allergen Reactions  . Lisinopril Other (See Comments) and Cough    MUCUS    Family History: Family History  Problem Relation Age of Onset  . Cancer Mother        thyroid  . Cancer Father        lung  . Thyroid disease Brother   .  Cancer Maternal Aunt        thyroid  . Cancer Sister   . Thyroid disease Brother   . Cancer Sister     Social History:  reports that he has been smoking.  He has a 15.00 pack-year smoking history. He uses smokeless tobacco. He reports that he drinks alcohol. He reports that he does not use drugs.  ROS: UROLOGY Frequent Urination?: Yes Hard to postpone urination?: Yes Burning/pain with urination?: No Get up at night to urinate?: No Leakage of urine?: Yes Urine stream starts and stops?: Yes Trouble starting stream?: Yes Do you have to strain to urinate?: Yes Blood in urine?: No Urinary tract infection?: No Sexually transmitted disease?: No Injury to kidneys or bladder?:  No Painful intercourse?: No Weak stream?: Yes Erection problems?: Yes Penile pain?: Yes  Gastrointestinal Nausea?: No Vomiting?: No Indigestion/heartburn?: No Diarrhea?: No Constipation?: No  Constitutional Fever: No Night sweats?: No Weight loss?: No Fatigue?: Yes  Skin Skin rash/lesions?: No Itching?: No  Eyes Blurred vision?: No Double vision?: No  Ears/Nose/Throat Sore throat?: No Sinus problems?: No  Hematologic/Lymphatic Swollen glands?: No Easy bruising?: No  Cardiovascular Leg swelling?: No Chest pain?: No  Respiratory Cough?: No Shortness of breath?: No  Endocrine Excessive thirst?: No  Musculoskeletal Back pain?: Yes Joint pain?: Yes  Neurological Headaches?: No Dizziness?: No  Psychologic Depression?: No Anxiety?: No  Physical Exam: BP (!) 185/87   Pulse 64   Resp 16   Ht 5\' 8"  (1.727 m)   Wt 217 lb 6.4 oz (98.6 kg)   SpO2 97%   BMI 33.06 kg/m   Constitutional:  Alert and oriented, No acute distress. HEENT: [place] AT, moist mucus membranes.  Trachea midline, no masses. Cardiovascular: No clubbing, cyanosis, or edema. Respiratory: Normal respiratory effort, no increased work of breathing. GI: Abdomen is soft, nontender, nondistended, no abdominal masses GU: No CVA tenderness.  Prostate 40 g, smooth without nodules Skin: No rashes, bruises or suspicious lesions. Lymph: No cervical or inguinal adenopathy. Neurologic: Grossly intact, no focal deficits, moving all 4 extremities. Psychiatric: Normal mood and affect.  Laboratory Data: Lab Results  Component Value Date   WBC 5.8 04/13/2017   HGB 13.6 04/13/2017   HCT 39.5 04/13/2017   MCV 83.3 04/13/2017   PLT 195 04/13/2017    Lab Results  Component Value Date   CREATININE 1.10 04/13/2017    Lab Results  Component Value Date   HGBA1C 5.8 (H) 04/13/2017    Urinalysis Dipstick/microscopy negative   Assessment & Plan:   57 year old male with bothersome lower urinary  tract symptoms which typically occur later in the day.  He will continue finasteride.  Will increase his tamsulosin to 0.8 mg.  Follow-up in approximately 1 month for symptom recheck.  If he is still having bothersome symptoms we will discuss surgical options.  If he is not interested in surgery could consider a trial of silodosin in lieu of tamsulosin.  Check PSA on follow-up.  1. Benign prostatic hyperplasia with urinary hesitancy  - Urinalysis, Complete - Bladder Scan (Post Void Residual) in office    Abbie Sons, MD  [facility] [address], [place],  [address] [phone]

## 2017-05-07 ENCOUNTER — Encounter: Payer: Self-pay | Admitting: Urology

## 2017-05-18 ENCOUNTER — Other Ambulatory Visit: Payer: Self-pay | Admitting: Family Medicine

## 2017-05-18 DIAGNOSIS — M25561 Pain in right knee: Secondary | ICD-10-CM

## 2017-06-05 ENCOUNTER — Other Ambulatory Visit: Payer: BLUE CROSS/BLUE SHIELD

## 2017-06-05 ENCOUNTER — Other Ambulatory Visit: Payer: Self-pay

## 2017-06-05 DIAGNOSIS — N401 Enlarged prostate with lower urinary tract symptoms: Secondary | ICD-10-CM

## 2017-06-05 DIAGNOSIS — R3911 Hesitancy of micturition: Principal | ICD-10-CM

## 2017-06-06 LAB — PSA: Prostate Specific Ag, Serum: 2.8 ng/mL (ref 0.0–4.0)

## 2017-06-07 ENCOUNTER — Encounter: Payer: Self-pay | Admitting: Urology

## 2017-06-07 ENCOUNTER — Ambulatory Visit: Payer: BLUE CROSS/BLUE SHIELD | Admitting: Urology

## 2017-06-07 ENCOUNTER — Telehealth: Payer: Self-pay

## 2017-06-07 VITALS — BP 176/89 | HR 60 | Resp 16 | Ht 67.0 in | Wt 212.3 lb

## 2017-06-07 DIAGNOSIS — N400 Enlarged prostate without lower urinary tract symptoms: Secondary | ICD-10-CM | POA: Diagnosis not present

## 2017-06-07 NOTE — Telephone Encounter (Signed)
-----   Message from Abbie Sons, MD sent at 06/06/2017  4:56 PM EDT ----- PSA was normal at 2.8

## 2017-06-07 NOTE — Telephone Encounter (Signed)
Letter sent.

## 2017-06-09 ENCOUNTER — Other Ambulatory Visit: Payer: Self-pay | Admitting: Family Medicine

## 2017-06-09 DIAGNOSIS — M25561 Pain in right knee: Secondary | ICD-10-CM

## 2017-06-11 ENCOUNTER — Encounter: Payer: Self-pay | Admitting: Urology

## 2017-06-11 NOTE — Progress Notes (Signed)
06/07/2017 10:27 AM   Todd Burns 02/05/1961 [id number]  Referring provider: Olin Hauser, DO [address] [place], [place] [address]  Chief Complaint  Patient presents with  . Follow-up    HPI: Refer to my previous note of 05/05/2017.  He noted mild symptomatic improvement with increasing the tamsulosin to 0.8 mg.  He states he feels like there is an obstruction in his urethra.   PMH: Past Medical History:  Diagnosis Date  . BPH (benign prostatic hyperplasia)   . Hypertension   . Hypothyroidism     Surgical History: Past Surgical History:  Procedure Laterality Date  . COLONOSCOPY N/A 08/08/2014   Procedure: COLONOSCOPY;  Surgeon: Lucilla Lame, MD;  Location: [facility];  Service: Gastroenterology;  Laterality: N/A;  . MEDIAL COLLATERAL LIGAMENT REPAIR, KNEE  1999  . SHOULDER ARTHROSCOPY WITH ROTATOR CUFF REPAIR Right 10/01/2014   Procedure: SHOULDER ARTHROSCOPY WITH ROTATOR CUFF REPAIR;  Surgeon: Earnestine Leys, MD;  Location: [facility];  Service: Orthopedics;  Laterality: Right;  Mini open rotator cuff repair Arthroscopy  Distal clavicle excision Subacromial decompression    Home Medications:  Allergies as of 06/07/2017      Reactions   Lisinopril Other (See Comments), Cough   MUCUS      Medication List        Accurate as of 06/07/17 11:59 PM. Always use your most recent med list.          amLODipine 5 MG tablet Commonly known as:  NORVASC Take 5 mg by mouth every morning.   finasteride 5 MG tablet Commonly known as:  PROSCAR TAKE 1 TABLET BY MOUTH EVERY DAY   fluticasone 50 MCG/ACT nasal spray Commonly known as:  FLONASE Place 2 sprays into both nostrils daily. Use for 4-6 weeks then stop and use seasonally or as needed.   ibuprofen 800 MG tablet Commonly known as:  ADVIL,MOTRIN ibuprofen 800 mg tablet   ipratropium 0.06 % nasal spray Commonly known as:  ATROVENT Place 2 sprays into both nostrils 4 (four) times daily. For up to 5-7 days  then stop.   levothyroxine 175 MCG tablet Commonly known as:  SYNTHROID, LEVOTHROID Take 1 tablet (175 mcg total) by mouth daily before breakfast.   losartan-hydrochlorothiazide 100-25 MG tablet Commonly known as:  HYZAAR Take 1 tablet by mouth daily at 6 PM.   naproxen 500 MG tablet Commonly known as:  NAPROSYN TAKE 1 TABLET (500 MG TOTAL) BY MOUTH 2 (TWO) TIMES DAILY WITH A MEAL. FOR 2-4 WEEKS THEN AS NEEDED   tamsulosin 0.4 MG Caps capsule Commonly known as:  FLOMAX Take 1 capsule (0.4 mg total) by mouth 2 (two) times daily.       Allergies:  Allergies  Allergen Reactions  . Lisinopril Other (See Comments) and Cough    MUCUS    Family History: Family History  Problem Relation Age of Onset  . Cancer Mother        thyroid  . Cancer Father        lung  . Thyroid disease Brother   . Cancer Maternal Aunt        thyroid  . Cancer Sister   . Thyroid disease Brother   . Cancer Sister     Social History:  reports that he has been smoking.  He has a 15.00 pack-year smoking history. He uses smokeless tobacco. He reports that he drinks alcohol. He reports that he does not use drugs.  ROS: UROLOGY Frequent Urination?: Yes  Hard to postpone urination?: Yes Burning/pain with urination?: Yes Get up at night to urinate?: Yes Leakage of urine?: Yes Urine stream starts and stops?: Yes Trouble starting stream?: Yes Do you have to strain to urinate?: Yes Blood in urine?: No Urinary tract infection?: No Sexually transmitted disease?: No Injury to kidneys or bladder?: No Painful intercourse?: No Weak stream?: Yes Erection problems?: Yes Penile pain?: Yes  Gastrointestinal Nausea?: No Vomiting?: No Indigestion/heartburn?: No Diarrhea?: No Constipation?: Yes  Constitutional Fever: No Night sweats?: No Weight loss?: No Fatigue?: No  Skin Skin rash/lesions?: No Itching?: No  Eyes Blurred vision?: Yes Double vision?: No  Ears/Nose/Throat Sore throat?:  No Sinus problems?: No  Hematologic/Lymphatic Swollen glands?: No Easy bruising?: No  Cardiovascular Leg swelling?: No Chest pain?: No  Respiratory Cough?: No Shortness of breath?: No  Endocrine Excessive thirst?: No  Musculoskeletal Back pain?: No Joint pain?: No  Neurological Headaches?: No Dizziness?: No  Psychologic Depression?: No Anxiety?: No  Physical Exam: BP (!) 176/89   Pulse 60   Resp 16   Ht 5\' 7"  (1.702 m)   Wt 212 lb 4.8 oz (96.3 kg)   SpO2 98%   BMI 33.25 kg/m   Constitutional:  Alert and oriented, No acute distress. HEENT: [place] AT, moist mucus membranes.  Trachea midline. Cardiovascular: No clubbing, cyanosis, or edema. Respiratory: Normal respiratory effort, no increased work of breathing. Lymph: No cervical or inguinal lymphadenopathy. Skin: No rashes, bruises or suspicious lesions. Psychiatric: Normal mood and affect.  Laboratory Data: Lab Results  Component Value Date   WBC 5.8 04/13/2017   HGB 13.6 04/13/2017   HCT 39.5 04/13/2017   MCV 83.3 04/13/2017   PLT 195 04/13/2017    Lab Results  Component Value Date   CREATININE 1.10 04/13/2017    Lab Results  Component Value Date   HGBA1C 5.8 (H) 04/13/2017    Assessment & Plan:   Only mild improvement in his lower urinary tract symptoms with increasing the tamsulosin 0.8 mg.  Will schedule cystoscopy for further evaluation.  PSA 4/1 was 2.8.   Abbie Sons, [facility] [address] [place], [place] [address] [phone]

## 2017-06-30 ENCOUNTER — Encounter: Payer: Self-pay | Admitting: Urology

## 2017-06-30 ENCOUNTER — Ambulatory Visit: Payer: BLUE CROSS/BLUE SHIELD | Admitting: Urology

## 2017-06-30 VITALS — BP 191/93 | HR 64 | Ht 67.0 in | Wt 213.4 lb

## 2017-06-30 DIAGNOSIS — R3911 Hesitancy of micturition: Secondary | ICD-10-CM | POA: Diagnosis not present

## 2017-06-30 DIAGNOSIS — N401 Enlarged prostate with lower urinary tract symptoms: Secondary | ICD-10-CM | POA: Diagnosis not present

## 2017-06-30 LAB — URINALYSIS, COMPLETE
Bilirubin, UA: NEGATIVE
Glucose, UA: NEGATIVE
Ketones, UA: NEGATIVE
Leukocytes, UA: NEGATIVE
Nitrite, UA: NEGATIVE
Protein, UA: NEGATIVE
Specific Gravity, UA: <1.005 — ABNORMAL LOW (ref 1.005–1.030)
Urobilinogen, Ur: 0.2 mg/dL (ref 0.2–1.0)
pH, UA: 7 (ref 5.0–7.5)

## 2017-06-30 MED ORDER — CIPROFLOXACIN HCL 500 MG PO TABS
500.0000 mg | ORAL_TABLET | Freq: Once | ORAL | Status: AC
  Administered 2017-06-30: 500 mg via ORAL

## 2017-06-30 MED ORDER — LIDOCAINE HCL URETHRAL/MUCOSAL 2 % EX GEL
1.0000 | Freq: Once | CUTANEOUS | Status: AC
Start: 2017-06-30 — End: 2017-06-30
  Administered 2017-06-30: 1 via URETHRAL

## 2017-06-30 NOTE — Progress Notes (Signed)
   06/30/17  CC:  Chief Complaint  Patient presents with  . Cysto    HPI: 57 year old male with a history of BPH and lower urinary tract symptoms on combination therapy.  He was recently seen for worsening obstructive voiding symptoms.  His tamsulosin was increased to 0.8 mg.  He had initially noted mild improvement and was scheduled for cystoscopy.  He thinks his symptoms have improved additionally.  Blood pressure (!) 191/93, pulse 64, height 5\' 7"  (1.702 m), weight 213 lb 6.4 oz (96.8 kg).   Cystoscopy Procedure Note  Patient identification was confirmed, informed consent was obtained, and patient was prepped using Betadine solution.  Lidocaine jelly was administered per urethral meatus.    Preoperative abx where received prior to procedure.     Pre-Procedure: - Inspection reveals a normal caliber ureteral meatus.  Procedure: The flexible cystoscope was introduced without difficulty - No urethral strictures/lesions are present. -Lateral lobe enlargement with prostatic urethral length approximately 4 cm - Mild bladder neck elevation without median lobe - Bilateral ureteral orifices identified - Bladder mucosa  reveals no ulcers, tumors, or lesions - No bladder stones - No trabeculation  Retroflexion shows no intravesical median lobe   Post-Procedure: - Patient tolerated the procedure well  Assessment/ Plan: 57 year old male with BPH on maximal medical therapy.  Outlet procedures were discussed including TURP and PVP.  I also discussed UroLift and he was given literature.  If he desires to continue medical therapy will have him follow-up in 6 months.

## 2017-07-16 ENCOUNTER — Other Ambulatory Visit: Payer: Self-pay | Admitting: Internal Medicine

## 2017-07-23 ENCOUNTER — Other Ambulatory Visit: Payer: Self-pay | Admitting: Family Medicine

## 2017-07-23 DIAGNOSIS — M25561 Pain in right knee: Secondary | ICD-10-CM

## 2017-08-10 ENCOUNTER — Other Ambulatory Visit: Payer: Self-pay | Admitting: Internal Medicine

## 2017-08-12 ENCOUNTER — Other Ambulatory Visit: Payer: Self-pay | Admitting: Family Medicine

## 2017-08-12 DIAGNOSIS — N401 Enlarged prostate with lower urinary tract symptoms: Secondary | ICD-10-CM

## 2017-08-12 DIAGNOSIS — R3914 Feeling of incomplete bladder emptying: Principal | ICD-10-CM

## 2017-09-09 ENCOUNTER — Other Ambulatory Visit: Payer: Self-pay | Admitting: Family Medicine

## 2017-09-09 DIAGNOSIS — N401 Enlarged prostate with lower urinary tract symptoms: Secondary | ICD-10-CM

## 2017-09-09 DIAGNOSIS — R3914 Feeling of incomplete bladder emptying: Principal | ICD-10-CM

## 2017-10-05 ENCOUNTER — Other Ambulatory Visit: Payer: BLUE CROSS/BLUE SHIELD

## 2017-10-05 DIAGNOSIS — R7309 Other abnormal glucose: Secondary | ICD-10-CM

## 2017-10-05 DIAGNOSIS — I10 Essential (primary) hypertension: Secondary | ICD-10-CM

## 2017-10-05 DIAGNOSIS — E039 Hypothyroidism, unspecified: Secondary | ICD-10-CM | POA: Diagnosis not present

## 2017-10-06 LAB — BASIC METABOLIC PANEL WITH GFR
BUN: 10 mg/dL (ref 7–25)
CO2: 25 mmol/L (ref 20–32)
Calcium: 8.7 mg/dL (ref 8.6–10.3)
Chloride: 103 mmol/L (ref 98–110)
Creat: 0.98 mg/dL (ref 0.70–1.33)
GFR, Est African American: 98 mL/min/{1.73_m2} (ref >=60)
GFR, Est Non African American: 85 mL/min/{1.73_m2} (ref >=60)
Glucose, Bld: 112 mg/dL — ABNORMAL HIGH (ref 65–99)
Potassium: 3.2 mmol/L — ABNORMAL LOW (ref 3.5–5.3)
Sodium: 137 mmol/L (ref 135–146)

## 2017-10-06 LAB — TSH: TSH: 5.71 mIU/L — ABNORMAL HIGH (ref 0.40–4.50)

## 2017-10-06 LAB — HEMOGLOBIN A1C
Hgb A1c MFr Bld: 5.8 % of total Hgb — ABNORMAL HIGH (ref <5.7)
Mean Plasma Glucose: 120 (calc)
eAG (mmol/L): 6.6 (calc)

## 2017-10-06 LAB — T4, FREE: Free T4: 1.2 ng/dL (ref 0.8–1.8)

## 2017-10-10 ENCOUNTER — Ambulatory Visit: Payer: BLUE CROSS/BLUE SHIELD | Admitting: Family Medicine

## 2017-10-10 ENCOUNTER — Encounter: Payer: Self-pay | Admitting: Family Medicine

## 2017-10-10 VITALS — BP 178/86 | HR 59 | Temp 98.5°F | Resp 16 | Ht 67.0 in | Wt 215.0 lb

## 2017-10-10 DIAGNOSIS — R3911 Hesitancy of micturition: Secondary | ICD-10-CM

## 2017-10-10 DIAGNOSIS — M25561 Pain in right knee: Secondary | ICD-10-CM

## 2017-10-10 DIAGNOSIS — R3914 Feeling of incomplete bladder emptying: Secondary | ICD-10-CM

## 2017-10-10 DIAGNOSIS — R7309 Other abnormal glucose: Secondary | ICD-10-CM | POA: Diagnosis not present

## 2017-10-10 DIAGNOSIS — I10 Essential (primary) hypertension: Secondary | ICD-10-CM

## 2017-10-10 DIAGNOSIS — N401 Enlarged prostate with lower urinary tract symptoms: Secondary | ICD-10-CM

## 2017-10-10 DIAGNOSIS — E039 Hypothyroidism, unspecified: Secondary | ICD-10-CM

## 2017-10-10 DIAGNOSIS — G8929 Other chronic pain: Secondary | ICD-10-CM

## 2017-10-10 DIAGNOSIS — W57XXXA Bitten or stung by nonvenomous insect and other nonvenomous arthropods, initial encounter: Secondary | ICD-10-CM

## 2017-10-10 MED ORDER — TAMSULOSIN HCL 0.4 MG PO CAPS: 0.4000 mg | ORAL_CAPSULE | Freq: Two times a day (BID) | ORAL | 1 refills | Status: DC

## 2017-10-10 MED ORDER — FINASTERIDE 5 MG PO TABS: 5.0000 mg | ORAL_TABLET | Freq: Every day | ORAL | 1 refills | Status: DC

## 2017-10-10 MED ORDER — BACLOFEN 10 MG PO TABS: 5.0000 mg | ORAL_TABLET | Freq: Three times a day (TID) | ORAL | 2 refills | Status: DC | PRN

## 2017-10-10 MED ORDER — AMLODIPINE BESYLATE 5 MG PO TABS: 5.0000 mg | ORAL_TABLET | Freq: Every day | ORAL | 1 refills | Status: DC

## 2017-10-10 MED ORDER — TRIAMCINOLONE ACETONIDE 0.1 % EX CREA: 1.0000 "application " | TOPICAL_CREAM | Freq: Two times a day (BID) | CUTANEOUS | 2 refills | Status: DC

## 2017-10-10 MED ORDER — LOSARTAN POTASSIUM 100 MG PO TABS
100.0000 mg | ORAL_TABLET | Freq: Every day | ORAL | 1 refills | Status: DC
Start: 2017-10-10 — End: 2018-04-09

## 2017-10-10 NOTE — Assessment & Plan Note (Signed)
Stable elevated A1c 5.8 concern new dx PreDM  Plan:  1. Not on any therapy currently  2. Encourage improved lifestyle - low carb, low sugar diet, reduce portion size, continue improving regular exercise 3. Follow-up 3 months A1c POC

## 2017-10-10 NOTE — Patient Instructions (Addendum)
Thank you for coming to the office today.  For bug bites - this looks like this is a skin reaction to the bite, sometimes by allergy otherwise it is a local reaction - Use topical steroid cream as prescribed for future bug bites. Do not use on face. Use twice a day for up to 2 weeks, may lighten skin.  For Blood pressure - still elevated. May be due to pain - RESTART Losartan 100mg  daily - new rx sent to pharmacy - Continue Amlodipine 5mg  daily - we may need to INCREASE this in future to 2 pills or 10mg  - Check BP at home once a week, if consistently >150 on readings, then can contact office and we can double Amlodipine  For blood sugar - A1c 5.8, unchanged, continue improving lifestyle diet  For thryoid - improved lab result - continue current dose Levothyroxine 132mcg daily  Please schedule a Follow-up Appointment to: Return in about 3 months (around 01/10/2018) for PreDM A1c, HTN med adjust, R Knee pain meniscus.  If you have any other questions or concerns, please feel free to call the office or send a message through [facility]. You may also schedule an earlier appointment if necessary.  Additionally, you may be receiving a survey about your experience at our office within a few days to 1 week by e-mail or mail. We value your feedback.  [name], DO [facility]

## 2017-10-10 NOTE — Assessment & Plan Note (Signed)
Improved TSH now approx 5, from prior >15 Continue current dose Levothyroxine 163mcg daily Re-check TSH Free T4 in approx 6 months at annual phys

## 2017-10-10 NOTE — Assessment & Plan Note (Signed)
Persistent pain R anterior knee, now recent dx from [facility] torn meniscus, likely needs procedure/surgical repair - Limited improvement from last injection 04/2017 - was only temporary  Plan Continue NSAIDs naproxen, compression, RICE - until he can return to [facility] to pursue surgical intervention, he has to wait due to wife having surgery this week, cannot be out of work as well.

## 2017-10-10 NOTE — Assessment & Plan Note (Addendum)
Uncontrolled HTN - in pain with knee, and seems to have been taken off BP meds in past - Home BP readings limited  No known complications Prior meds HCTZ 25mg , Carvedilol     Plan:  1. RESTART Losartan 100mg  daily - Continue Amlodipine 5mg  daily 2. Encourage improved lifestyle - low sodium diet, regular exercise 3. Start weekly monitor BP outside office, bring readings to next visit, if persistently >140/90 or new symptoms notify office sooner 4. Follow-up 3 months - or sooner - if BP remains elevated >150 will double Amlodipine from 5 to 10mg  - future may add Thiazide back  Likely improved once knee pain improves

## 2017-10-10 NOTE — Progress Notes (Signed)
Subjective:    Patient ID: Todd Burns, male    DOB: December 18, 1960, 57 y.o.   MRN: [id number]  Todd Burns is a 57 y.o. male presenting on 10/10/2017 for Thyroid Problem   HPI   Hypothyroidism (acquired) s/pHistory of Hyperthyroidism s/p treatment with radiation in 1990s Last lab showed improved TSH from >15 to >5 He continues on Levothyroxine 123mcg daily Today doing well, no new concerns with thyroid Denies edema, cold or temperature changes  Pre-Diabetes Recent A1c at 5.8, unchanged from previous Not checking CBG Meds: not taking meds Currently now restarted on ARB today Lifestyle: - Diet (low carb, low sugar, improving)  - Exercise (limited due to knee pain) Denies hypoglycemia, polyuria, visual changes, numbness or tingling.  CHRONIC HTN: Reports not checking BP at home. He is currently in pain. Prior history Losartan-HCTZ 100-25mg  daily, he was discontinued on this in past. Also in past he was carvedilol. Current Meds - Amlodipine 5mg  daily Reports good compliance, took meds today. Tolerating well, w/o complaints.  Follow-up Right Knee Pain / Torn Meniscus Additional update, [facility], recently diagnosed him with torn meniscus in R Knee, however he was unable to proceed with treatment plan as requested, may need to wait until his wife recovers from her upcoming surgery because she will be out of work for a period of time. - He still has pain and swelling at times - taking naproxen 500 BID with relief, not taking anything during afternoon at work, not using Tylenol, worse after prolonged sitting will be stiff - Wearing compression sleeve some improvement, reduced swelling - He has prior history of Right knee surgical repair of MCL and possibly meniscus injury performed years ago, 1999 on chart review - Admits knee pain and mild anterior swelling Able to ambulate without assistance Denies injury fall trauma  Health Maintenance: Will return for Flu Shot  Depression  screen [facility] 2/9 10/10/2017 04/19/2017 03/22/2017  Decreased Interest 0 0 0  Down, Depressed, Hopeless 0 0 0  PHQ - 2 Score 0 0 0    Social History   Tobacco Use  . Smoking status: Current Every Day Smoker    Packs/day: 1.00    Years: 15.00    Pack years: 15.00  . Smokeless tobacco: Current User  Substance Use Topics  . Alcohol use: Yes    Comment: occ  . Drug use: No    Review of Systems Per HPI unless specifically indicated above     Objective:    BP (!) 178/86 (BP Location: Left Arm, Cuff Size: Normal)   Pulse (!) 59   Temp 98.5 F (36.9 C) (Oral)   Resp 16   Ht 5\' 7"  (1.702 m)   Wt 215 lb (97.5 kg)   BMI 33.67 kg/m   Wt Readings from Last 3 Encounters:  10/10/17 215 lb (97.5 kg)  06/30/17 213 lb 6.4 oz (96.8 kg)  06/07/17 212 lb 4.8 oz (96.3 kg)    Physical Exam  Constitutional: He is oriented to person, place, and time. He appears well-developed and well-nourished. No distress.  Well-appearing, comfortable, cooperative  HENT:  Head: Normocephalic and atraumatic.  Mouth/Throat: Oropharynx is clear and moist.  Eyes: Conjunctivae are normal. Right eye exhibits no discharge. Left eye exhibits no discharge.  Neck: Normal range of motion. Neck supple. No thyromegaly present.  Cardiovascular: Normal rate, regular rhythm, normal heart sounds and intact distal pulses.  No murmur heard. Pulmonary/Chest: Effort normal and breath sounds normal. No respiratory distress. He has  no wheezes. He has no rales.  Musculoskeletal: He exhibits no edema.  Right Knee Did not re-examine completely today after new diagnosis of torn meniscus Range of motion slightly reduced, mild anterior lateral slight effusion seems soft tissue, without erythema or ecchymosis  Lymphadenopathy:    He has no cervical adenopathy.  Neurological: He is alert and oriented to person, place, and time.  Skin: Skin is warm and dry. No rash noted. He is not diaphoretic. No erythema.  Small 1-2 cm area of  darkened skin with slight thicker center R lower abdomen residual reaction from bug bite  Psychiatric: He has a normal mood and affect. His behavior is normal.  Well groomed, good eye contact, normal speech and thoughts  Nursing note and vitals reviewed.  Results for orders placed or performed in visit on 10/05/17  T4, free  Result Value Ref Range   Free T4 1.2 0.8 - 1.8 ng/dL  TSH  Result Value Ref Range   TSH 5.71 (H) 0.40 - 4.50 mIU/L  Hemoglobin A1c  Result Value Ref Range   Hgb A1c MFr Bld 5.8 (H) <5.7 % of total Hgb   Mean Plasma Glucose 120 (calc)   eAG (mmol/L) 6.6 (calc)  BASIC METABOLIC PANEL WITH GFR  Result Value Ref Range   Glucose, Bld 112 (H) 65 - 99 mg/dL   BUN 10 7 - 25 mg/dL   Creat 0.98 0.70 - 1.33 mg/dL   GFR, Est Non African American 85 > OR = 60 mL/min/1.61m2   GFR, Est African American 98 > OR = 60 mL/min/1.43m2   BUN/Creatinine Ratio NOT APPLICABLE 6 - 22 (calc)   Sodium 137 135 - 146 mmol/L   Potassium 3.2 (L) 3.5 - 5.3 mmol/L   Chloride 103 98 - 110 mmol/L   CO2 25 20 - 32 mmol/L   Calcium 8.7 8.6 - 10.3 mg/dL      Assessment & Plan:   Problem List Items Addressed This Visit    BPH (benign prostatic hyperplasia)    Chronic BPH with some persistent refractory symptoms LUTS Last checked [facility] BPH score 17 (03/23/17) - On Finasteride 5mg , Tamsulosin 0.4mg  BID - No known personal/family history of prostate CA  Plan: Followed by [facility] Agreed to refill Finasteride and Tamsulosin 0.4mg  BID today - he will f/u as scheduled with Urology      Relevant Medications   finasteride (PROSCAR) 5 MG tablet   tamsulosin (FLOMAX) 0.4 MG CAPS capsule   Chronic pain of right knee    Persistent pain R anterior knee, now recent dx from [facility] torn meniscus, likely needs procedure/surgical repair - Limited improvement from last injection 04/2017 - was only temporary  Plan Continue NSAIDs naproxen, compression, RICE - until he can return to [facility] to pursue  surgical intervention, he has to wait due to wife having surgery this week, cannot be out of work as well.      Relevant Medications   baclofen (LIORESAL) 10 MG tablet   Elevated hemoglobin A1c    Stable elevated A1c 5.8 concern new dx PreDM  Plan:  1. Not on any therapy currently  2. Encourage improved lifestyle - low carb, low sugar diet, reduce portion size, continue improving regular exercise 3. Follow-up 3 months A1c POC      Hypertension - Primary    Uncontrolled HTN - in pain with knee, and seems to have been taken off BP meds in past - Home BP readings limited  No known complications Prior  meds HCTZ 25mg , Carvedilol     Plan:  1. RESTART Losartan 100mg  daily - Continue Amlodipine 5mg  daily 2. Encourage improved lifestyle - low sodium diet, regular exercise 3. Start weekly monitor BP outside office, bring readings to next visit, if persistently >140/90 or new symptoms notify office sooner 4. Follow-up 3 months - or sooner - if BP remains elevated >150 will double Amlodipine from 5 to 10mg  - future may add Thiazide back  Likely improved once knee pain improves       Relevant Medications   losartan (COZAAR) 100 MG tablet   amLODipine (NORVASC) 5 MG tablet   Hypothyroidism    Improved TSH now approx 5, from prior >15 Continue current dose Levothyroxine 111mcg daily Re-check TSH Free T4 in approx 6 months at annual phys       Other Visit Diagnoses    Bug bite, initial encounter       Relevant Medications   triamcinolone cream (KENALOG) 0.1 %      Meds ordered this encounter  Medications  . losartan (COZAAR) 100 MG tablet    Sig: Take 1 tablet (100 mg total) by mouth daily.    Dispense:  90 tablet    Refill:  1  . amLODipine (NORVASC) 5 MG tablet    Sig: Take 1 tablet (5 mg total) by mouth daily.    Dispense:  90 tablet    Refill:  1  . finasteride (PROSCAR) 5 MG tablet    Sig: Take 1 tablet (5 mg total) by mouth daily.    Dispense:  90 tablet    Refill:   1    Please change rx on file to 90 day supply - fill when patient is ready  . baclofen (LIORESAL) 10 MG tablet    Sig: Take 0.5-1 tablets (5-10 mg total) by mouth 3 (three) times daily as needed for muscle spasms.    Dispense:  30 tablet    Refill:  2  . tamsulosin (FLOMAX) 0.4 MG CAPS capsule    Sig: Take 1 capsule (0.4 mg total) by mouth 2 (two) times daily.    Dispense:  180 capsule    Refill:  1  . triamcinolone cream (KENALOG) 0.1 %    Sig: Apply 1 application topically 2 (two) times daily. As needed for bug bites    Dispense:  30 g    Refill:  2    Follow up plan: Return in about 3 months (around 01/10/2018) for PreDM A1c, HTN med adjust, R Knee pain meniscus.  [name], [facility] 10/10/2017, 8:46 AM

## 2017-10-10 NOTE — Assessment & Plan Note (Signed)
Chronic BPH with some persistent refractory symptoms LUTS Last checked [facility] BPH score 17 (03/23/17) - On Finasteride 5mg , Tamsulosin 0.4mg  BID - No known personal/family history of prostate CA  Plan: Followed by [facility] Agreed to refill Finasteride and Tamsulosin 0.4mg  BID today - he will f/u as scheduled with Urology

## 2017-10-12 ENCOUNTER — Ambulatory Visit: Payer: BLUE CROSS/BLUE SHIELD | Admitting: Family Medicine

## 2018-01-03 ENCOUNTER — Encounter: Payer: Self-pay | Admitting: Urology

## 2018-01-03 ENCOUNTER — Ambulatory Visit: Payer: BLUE CROSS/BLUE SHIELD | Admitting: Urology

## 2018-01-18 ENCOUNTER — Ambulatory Visit: Payer: BLUE CROSS/BLUE SHIELD | Admitting: Family Medicine

## 2018-01-18 ENCOUNTER — Other Ambulatory Visit: Payer: Self-pay | Admitting: Family Medicine

## 2018-01-18 ENCOUNTER — Encounter: Payer: Self-pay | Admitting: Family Medicine

## 2018-01-18 VITALS — BP 160/82 | HR 66 | Resp 15 | Ht 67.0 in | Wt 220.4 lb

## 2018-01-18 DIAGNOSIS — R7309 Other abnormal glucose: Secondary | ICD-10-CM

## 2018-01-18 DIAGNOSIS — G8929 Other chronic pain: Secondary | ICD-10-CM

## 2018-01-18 DIAGNOSIS — E039 Hypothyroidism, unspecified: Secondary | ICD-10-CM

## 2018-01-18 DIAGNOSIS — E669 Obesity, unspecified: Secondary | ICD-10-CM | POA: Insufficient documentation

## 2018-01-18 DIAGNOSIS — Z23 Encounter for immunization: Secondary | ICD-10-CM

## 2018-01-18 DIAGNOSIS — N401 Enlarged prostate with lower urinary tract symptoms: Secondary | ICD-10-CM

## 2018-01-18 DIAGNOSIS — Z0000 Encounter for general adult medical examination without abnormal findings: Secondary | ICD-10-CM

## 2018-01-18 DIAGNOSIS — Z1159 Encounter for screening for other viral diseases: Secondary | ICD-10-CM

## 2018-01-18 DIAGNOSIS — E782 Mixed hyperlipidemia: Secondary | ICD-10-CM

## 2018-01-18 DIAGNOSIS — I10 Essential (primary) hypertension: Secondary | ICD-10-CM

## 2018-01-18 DIAGNOSIS — R3911 Hesitancy of micturition: Secondary | ICD-10-CM

## 2018-01-18 DIAGNOSIS — R7303 Prediabetes: Secondary | ICD-10-CM | POA: Diagnosis not present

## 2018-01-18 DIAGNOSIS — M25561 Pain in right knee: Secondary | ICD-10-CM

## 2018-01-18 LAB — POCT GLYCOSYLATED HEMOGLOBIN (HGB A1C): Hemoglobin A1C: 5.9 % — AB (ref 4.0–5.6)

## 2018-01-18 NOTE — Assessment & Plan Note (Signed)
Wt gain 5 lbs in 3 months Encourage lifestyle diet changes, stop sodas, switch to diet, inc water

## 2018-01-18 NOTE — Assessment & Plan Note (Signed)
Followed by [facility] Persistent R knee pain seems stable now, w/o active pain today, remains functional Has deferred meniscus repair surgery for now

## 2018-01-18 NOTE — Patient Instructions (Addendum)
Thank you for coming to the office today.  Flu shot today  Elevated BP still - please keep track of BP at home, and write down readings, once or twice a week, different times, if readings consistently elevated >150/90 then notify office in future and we can adjust medication further, may need to add a 3rd option or we can refer to Cardiology for 2nd opinion  Double up on Amlodipine from 5mg  up to 10mg  - take TWO of the 5mg  - NOTIFY [place] 10mg   A1c 5.9, keep up the good work. Switch to diet drinks, stay active.  DUE for FASTING BLOOD WORK (no food or drink after midnight before the lab appointment, only water or coffee without cream/sugar on the morning of)  SCHEDULE "Lab Only" visit in the morning at the clinic for lab draw in 3 MONTHS   - Make sure Lab Only appointment is at about 1 week before your next appointment, so that results will be available  For Lab Results, once available within 2-3 days of blood draw, you can can log in to MyChart online to view your results and a brief explanation. Also, we can discuss results at next follow-up visit.   Please schedule a Follow-up Appointment to: Return in about 3 months (around 04/20/2018) for Annual Physical.  If you have any other questions or concerns, please feel free to call the office or send a message through [place]. You may also schedule an earlier appointment if necessary.  Additionally, you may be receiving a survey about your experience at our office within a few days to 1 week by e-mail or mail. We value your feedback.  [name], DO [place]

## 2018-01-18 NOTE — Assessment & Plan Note (Addendum)
Still Uncontrolled HTN, w/o active knee pain. Already increased meds last visit. - Home BP readings not available - not checking No known complications Prior meds HCTZ 25mg  (declines due to ED), Carvedilol     Plan:  1. INCREASE Amlodipine from 5 to 10mg  daily - no new rx sent - patient to double up current pills, changed on his med rec, will request new rx when ready and send 10mg  - CONTINUE Losartan 100mg  daily 2. Encourage improved lifestyle - low sodium diet, regular exercise 3. AGAIN Need to start weekly monitor BP outside office, bring readings to next visit, if persistently >140/90 or new symptoms notify office sooner 4. Follow-up 3 months - or sooner - if BP remains elevated >150 will consider add additional agent, possibly spiro vs hydralazine vs BB - CONSIDER Referral to Cardiology

## 2018-01-18 NOTE — Progress Notes (Signed)
Subjective:    Patient ID: Todd Burns, male    DOB: 07/10/1960, 57 y.o.   MRN: [id number]  Todd Burns is a 57 y.o. male presenting on 01/18/2018 for Hypertension and Diabetes (Pre HA1C follow up )   HPI   Pre-Diabetes / Obesity BMI >34.vs Recent A1c at 5.8. Today A1c 5.9 Not checking CBG Meds: not taking meds Currently on ARB Lifestyle: - Weight up 5 lbs - Diet (low carb, low sugar, improving)  - Exercise (limited due to knee pain) Denies hypoglycemia, polyuria, visual changes, numbness or tingling.  CHRONIC HTN: Reportsnot checking BP at home, his wife is CNA they can check BP at home. Today without knee pain.  Current Meds -Amlodipine 5mg  daily, Losartan 100mg  daily - Past meds include HCTZ stopped due to ED, Carvedilol Reports good compliance, took meds today. Tolerating well, w/o complaints.   Follow-up Right Knee Pain / Torn Meniscus Followed by [facility], history of torn R meniscus, has deferred surgery - Today doing well. Occasional pain from knee if injure it, he can ambulate without difficulty, stiff with sitting - taking naproxen 500 BID with relief, not taking anything during afternoon at work, not using Tylenol, worse after prolonged sitting will be stiff - Wearing compression sleeve some improvement, reduced swelling - He has prior history of Right knee surgical repair of MCL and possibly meniscus injury performed years ago, 1999 on chart review - Admits knee pain and mild anterior swelling Able to ambulate without assistance Denies injury fall trauma  Health Maintenance: Due for Flu Shot, will receive today    Depression screen [facility] 2/9 10/10/2017 04/19/2017 03/22/2017  Decreased Interest 0 0 0  Down, Depressed, Hopeless 0 0 0  PHQ - 2 Score 0 0 0    Social History   Tobacco Use  . Smoking status: Current Every Day Smoker    Packs/day: 1.00    Years: 15.00    Pack years: 15.00  . Smokeless tobacco: Current User  Substance Use Topics  .  Alcohol use: Yes    Comment: occ  . Drug use: No    Review of Systems Per HPI unless specifically indicated above     Objective:    BP (!) 160/82 (BP Location: Left Arm, Cuff Size: Normal)   Pulse 66   Resp 15   Ht 5\' 7"  (1.702 m)   Wt 220 lb 6.4 oz (100 kg)   SpO2 98%   BMI 34.52 kg/m   Wt Readings from Last 3 Encounters:  01/18/18 220 lb 6.4 oz (100 kg)  10/10/17 215 lb (97.5 kg)  06/30/17 213 lb 6.4 oz (96.8 kg)    Physical Exam  Constitutional: He is oriented to person, place, and time. He appears well-developed and well-nourished. No distress.  Well-appearing, comfortable, cooperative  HENT:  Head: Normocephalic and atraumatic.  Mouth/Throat: Oropharynx is clear and moist.  Eyes: Conjunctivae are normal. Right eye exhibits no discharge. Left eye exhibits no discharge.  Neck: Normal range of motion. Neck supple. No thyromegaly present.  Cardiovascular: Normal rate, regular rhythm, normal heart sounds and intact distal pulses.  No murmur heard. Pulmonary/Chest: Effort normal and breath sounds normal. No respiratory distress. He has no wheezes. He has no rales.  Musculoskeletal: He exhibits no edema.  Right Knee Non tender today Persistent range of motion slightly reduced, mild anterior lateral slight effusion seems soft tissue, without erythema or ecchymosis  Lymphadenopathy:    He has no cervical adenopathy.  Neurological: He is  alert and oriented to person, place, and time.  Skin: Skin is warm and dry. No rash noted. He is not diaphoretic. No erythema.  Psychiatric: He has a normal mood and affect. His behavior is normal.  Well groomed, good eye contact, normal speech and thoughts  Nursing note and vitals reviewed.    Recent Labs    04/13/17 0804 10/05/17 0806 01/18/18 0819  HGBA1C 5.8* 5.8* 5.9*    Results for orders placed or performed in visit on 01/18/18  POCT glycosylated hemoglobin (Hb A1C)  Result Value Ref Range   Hemoglobin A1C 5.9 (A) 4.0 -  5.6 %      Assessment & Plan:   Problem List Items Addressed This Visit    Chronic pain of right knee    Followed by [facility] Persistent R knee pain seems stable now, w/o active pain today, remains functional Has deferred meniscus repair surgery for now      Essential hypertension - Primary    Still Uncontrolled HTN, w/o active knee pain. Already increased meds last visit. - Home BP readings not available - not checking No known complications Prior meds HCTZ 25mg  (declines due to ED), Carvedilol     Plan:  1. INCREASE Amlodipine from 5 to 10mg  daily - no new rx sent - patient to double up current pills, changed on his med rec, will request new rx when ready and send 10mg  - CONTINUE Losartan 100mg  daily 2. Encourage improved lifestyle - low sodium diet, regular exercise 3. AGAIN Need to start weekly monitor BP outside office, bring readings to next visit, if persistently >140/90 or new symptoms notify office sooner 4. Follow-up 3 months - or sooner - if BP remains elevated >150 will consider add additional agent, possibly spiro vs hydralazine vs BB - CONSIDER Referral to Cardiology      Relevant Medications   amLODipine (NORVASC) 10 MG tablet   Obesity (BMI 30.0-34.9)    Wt gain 5 lbs in 3 months Encourage lifestyle diet changes, stop sodas, switch to diet, inc water      Pre-diabetes    Stable PreDM A1c 5.9, similar to 5.8 in past Concern with obesity, HTN, HLD  Plan:  1. Not on any therapy currently - remain off 2. Encourage improved lifestyle - low carb, low sugar diet, stop soda - switch to diet, reduce portion size, continue improving regular exercise 3. Follow-up 3 months A1c       Relevant Orders   POCT glycosylated hemoglobin (Hb A1C) (Completed)    Other Visit Diagnoses    Needs flu shot       Relevant Orders   Flu Vaccine QUAD 36+ mos IM (Completed)      No orders of the defined types were placed in this encounter.   Follow up plan: Return in  about 3 months (around 04/20/2018) for Annual Physical.  Future labs ordered for 04/18/18  [name], [facility] 01/18/2018, 8:55 AM

## 2018-01-18 NOTE — Assessment & Plan Note (Addendum)
Stable PreDM A1c 5.9, similar to 5.8 in past Concern with obesity, HTN, HLD  Plan:  1. Not on any therapy currently - remain off 2. Encourage improved lifestyle - low carb, low sugar diet, stop soda - switch to diet, reduce portion size, continue improving regular exercise 3. Follow-up 3 months A1c

## 2018-03-20 ENCOUNTER — Emergency Department: Payer: Worker's Compensation

## 2018-03-20 ENCOUNTER — Emergency Department
Admission: EM | Admit: 2018-03-20 | Discharge: 2018-03-20 | Disposition: A | Discharge status: Home / Self Care | Payer: Worker's Compensation | Attending: Emergency Medicine | Admitting: Emergency Medicine

## 2018-03-20 ENCOUNTER — Encounter: Payer: Self-pay | Admitting: Emergency Medicine

## 2018-03-20 DIAGNOSIS — M795 Residual foreign body in soft tissue: Secondary | ICD-10-CM | POA: Diagnosis not present

## 2018-03-20 DIAGNOSIS — E039 Hypothyroidism, unspecified: Secondary | ICD-10-CM | POA: Insufficient documentation

## 2018-03-20 DIAGNOSIS — F17210 Nicotine dependence, cigarettes, uncomplicated: Secondary | ICD-10-CM | POA: Diagnosis not present

## 2018-03-20 DIAGNOSIS — F17228 Nicotine dependence, chewing tobacco, with other nicotine-induced disorders: Secondary | ICD-10-CM | POA: Insufficient documentation

## 2018-03-20 DIAGNOSIS — Z79899 Other long term (current) drug therapy: Secondary | ICD-10-CM | POA: Insufficient documentation

## 2018-03-20 DIAGNOSIS — I10 Essential (primary) hypertension: Secondary | ICD-10-CM | POA: Diagnosis not present

## 2018-03-20 MED ORDER — LIDOCAINE HCL 1 % IJ SOLN
5.0000 mL | Freq: Once | INTRAMUSCULAR | Status: AC
  Administered 2018-03-20: 5 mL
  Filled 2018-03-20: qty 10

## 2018-03-20 MED ORDER — AMOXICILLIN-POT CLAVULANATE 875-125 MG PO TABS: 1.0000 | ORAL_TABLET | Freq: Two times a day (BID) | ORAL | 0 refills | Status: AC

## 2018-03-20 NOTE — ED Triage Notes (Signed)
Pt with yarn hook stuck in right hand at the base of his index finger. Pt reports went to [facility] but they could not get it out and sent him to the ED.

## 2018-03-20 NOTE — ED Notes (Addendum)
See triage note  Presents with knitting hook to right hand  Family states he went to [facility]  Was given a tetanus shot and had his UDS done for w/c

## 2018-03-20 NOTE — ED Provider Notes (Signed)
[facility] Emergency Department Provider Note  ____________________________________________  Time seen: Approximately 3:58 PM  I have reviewed the triage vital signs and the nursing notes.   HISTORY  Chief Complaint Foreign Body    HPI Todd Burns is a 58 y.o. male presents to the emergency department with a knitting hook foreign body along the ulnar aspect of the right thumb.  Patient was seen at urgent care prior to presenting to the emergency department and they were unable to remove knitting hook after an injection of lidocaine and after making a small incision.  Patient denies numbness or tingling.  No pain over the course of the right first digit flexor tendon.  Patient denies changes in grip strength.  Tetanus status was updated at urgent care.  No other alleviating measures were attempted.   Past Medical History:  Diagnosis Date  . BPH (benign prostatic hyperplasia)   . Hypothyroidism     Patient Active Problem List   Diagnosis Date Noted  . Obesity (BMI 30.0-34.9) 01/18/2018  . Pre-diabetes 04/15/2017  . Chronic pain of right knee 03/23/2017  . S/P rotator cuff surgery 03/23/2017  . Mixed hyperlipidemia 03/23/2017  . Essential hypertension 03/22/2017  . Hypothyroidism 03/22/2017  . BPH (benign prostatic hyperplasia) 03/22/2017    Past Surgical History:  Procedure Laterality Date  . COLONOSCOPY N/A 08/08/2014   Procedure: COLONOSCOPY;  Surgeon: Lucilla Lame, MD;  Location: [place];  Service: Gastroenterology;  Laterality: N/A;  . MEDIAL COLLATERAL LIGAMENT REPAIR, KNEE  1999  . SHOULDER ARTHROSCOPY WITH ROTATOR CUFF REPAIR Right 10/01/2014   Procedure: SHOULDER ARTHROSCOPY WITH ROTATOR CUFF REPAIR;  Surgeon: Earnestine Leys, MD;  Location: [facility];  Service: Orthopedics;  Laterality: Right;  Mini open rotator cuff repair Arthroscopy  Distal clavicle excision Subacromial decompression    Prior to Admission medications    Medication Sig Start Date End Date Taking? Authorizing Provider  amLODipine (NORVASC) 10 MG tablet Take 1 tablet (10 mg total) by mouth daily. 01/18/18   Karamalegos, Devonne Doughty, DO  amoxicillin-clavulanate (AUGMENTIN) 875-125 MG tablet Take 1 tablet by mouth 2 (two) times daily for 10 days. 03/20/18 03/30/18  Lannie Fields, PA-C  baclofen (LIORESAL) 10 MG tablet Take 0.5-1 tablets (5-10 mg total) by mouth 3 (three) times daily as needed for muscle spasms. 10/10/17   Karamalegos, Devonne Doughty, DO  finasteride (PROSCAR) 5 MG tablet Take 1 tablet (5 mg total) by mouth daily. 10/10/17   Karamalegos, Devonne Doughty, DO  fluticasone (FLONASE) 50 MCG/ACT nasal spray Place 2 sprays into both nostrils daily. Use for 4-6 weeks then stop and use seasonally or as needed. 03/22/17   Karamalegos, Devonne Doughty, DO  levothyroxine (SYNTHROID, LEVOTHROID) 175 MCG tablet Take 1 tablet (175 mcg total) by mouth daily before breakfast. 04/19/17   Parks Ranger, Devonne Doughty, DO  losartan (COZAAR) 100 MG tablet Take 1 tablet (100 mg total) by mouth daily. 10/10/17   Karamalegos, Devonne Doughty, DO  naproxen (NAPROSYN) 500 MG tablet TAKE 1 TABLET (500 MG TOTAL) BY MOUTH 2 (TWO) TIMES DAILY WITH A MEAL. FOR 2-4 WEEKS THEN AS NEEDED 07/23/17   Parks Ranger, Devonne Doughty, DO  tamsulosin (FLOMAX) 0.4 MG CAPS capsule Take 1 capsule (0.4 mg total) by mouth 2 (two) times daily. 10/10/17   Karamalegos, Devonne Doughty, DO  triamcinolone cream (KENALOG) 0.1 % Apply 1 application topically 2 (two) times daily. As needed for bug bites 10/10/17   Olin Hauser, DO    Allergies Lisinopril  Family  History  Problem Relation Age of Onset  . Cancer Mother        thyroid  . Cancer Father        lung  . Thyroid disease Brother   . Cancer Maternal Aunt        thyroid  . Cancer Sister   . Thyroid disease Brother   . Cancer Sister     Social History Social History   Tobacco Use  . Smoking status: Current Every Day Smoker    Packs/day: 1.00     Years: 15.00    Pack years: 15.00  . Smokeless tobacco: Current User  Substance Use Topics  . Alcohol use: Yes    Comment: occ  . Drug use: No     Review of Systems  Constitutional: No fever/chills Eyes: No visual changes. No discharge ENT: No upper respiratory complaints. Cardiovascular: no chest pain. Respiratory: no cough. No SOB. Gastrointestinal: No abdominal pain.  No nausea, no vomiting.  No diarrhea.  No constipation. Genitourinary: Negative for dysuria. No hematuria Musculoskeletal: Patient has right hand foreign body.  Skin: Negative for rash, abrasions, lacerations, ecchymosis. Neurological: Negative for headaches, focal weakness or numbness. ____________________________________________   PHYSICAL EXAM:  VITAL SIGNS: ED Triage Vitals  Enc Vitals Group     BP 03/20/18 1502 (!) 168/79     Pulse Rate 03/20/18 1502 60     Resp --      Temp 03/20/18 1502 (!) 97.5 F (36.4 C)     Temp Source 03/20/18 1502 Oral     SpO2 03/20/18 1502 95 %     Weight 03/20/18 1454 215 lb (97.5 kg)     Height 03/20/18 1454 5\' 7"  (1.702 m)     Head Circumference --      Peak Flow --      Pain Score 03/20/18 1454 0     Pain Loc --      Pain Edu? --      Excl. in [place]? --      Constitutional: Alert and oriented. Well appearing and in no acute distress. Eyes: Conjunctivae are normal. PERRL. EOMI. Head: Atraumatic. Cardiovascular: Normal rate, regular rhythm. Normal S1 and S2.  Good peripheral circulation. Respiratory: Normal respiratory effort without tachypnea or retractions. Lungs CTAB. Good air entry to the bases with no decreased or absent breath sounds. Gastrointestinal: Bowel sounds 4 quadrants. Soft and nontender to palpation. No guarding or rigidity. No palpable masses. No distention. No CVA tenderness. Musculoskeletal: Patient is able to move all 5 right fingers.  No flexor or extensor tendon deficits appreciated with testing. Palpable radial pulse bilaterally and  symmetrically.  Neurologic:  Normal speech and language. No gross focal neurologic deficits are appreciated.  Skin:  Skin is warm, dry and intact. No rash noted. Psychiatric: Mood and affect are normal. Speech and behavior are normal. Patient exhibits appropriate insight and judgement.   ____________________________________________   LABS (all labs ordered are listed, but only abnormal results are displayed)  Labs Reviewed - No data to display ____________________________________________  EKG   ____________________________________________  RADIOLOGY I personally viewed and evaluated these images as part of my medical decision making, as well as reviewing the written report by the radiologist.  Dg Hand 2 View Right  Result Date: 03/20/2018 CLINICAL DATA:  Foreign body and penetrating trauma. EXAM: RIGHT HAND - 2 VIEW COMPARISON:  None. FINDINGS: There is no evidence of fracture or dislocation. There is no evidence of arthropathy. Metallic needle is seen in the  soft tissues anterior to the second metacarpophalangeal joint. IMPRESSION: Metallic foreign body is noted as described above. No fracture or other bony abnormality is noted. Electronically Signed   By: Marijo Conception, M.D.   On: 03/20/2018 15:24    ____________________________________________    PROCEDURES  Procedure(s) performed:    Procedures  Knitting hook was removed from right hand after 1% lidocaine without epinephrine was infiltrated along course of knitting hook.  Knitting hook was removed without complication using traction.   Medications  lidocaine (XYLOCAINE) 1 % (with pres) injection 5 mL (5 mLs Infiltration Given by Other 03/20/18 1558)     ____________________________________________   INITIAL IMPRESSION / ASSESSMENT AND PLAN / ED COURSE  Pertinent labs & imaging results that were available during my care of the patient were reviewed by me and considered in my medical decision making (see chart  for details).  Review of the [place] CSRS was performed in accordance of the [place] prior to dispensing any controlled drugs.      Assessment and Plan: Knitting Hook Foreign Body:  Patient presents to the emergency department with a knitting hook foreign body.  Knitting hook was removed in the emergency department without complication.  X-ray examination of the right hand reveals no bony abnormalities.  Patient education regarding basic wound care was given.  Patient was discharged with Augmentin and advised to follow-up with Dr. Peggye Ley as needed.  All patient questions were answered.     ____________________________________________  FINAL CLINICAL IMPRESSION(S) / ED DIAGNOSES  Final diagnoses:  Soft tissues foreign body      NEW MEDICATIONS STARTED DURING THIS VISIT:  ED Discharge Orders         Ordered    amoxicillin-clavulanate (AUGMENTIN) 875-125 MG tablet  2 times daily     03/20/18 1555              This chart was dictated using voice recognition software/Dragon. Despite best efforts to proofread, errors can occur which can change the meaning. Any change was purely unintentional.    Lannie Fields, PA-C 03/20/18 1614    Clearnce Hasten Randall An, MD 03/20/18 2252

## 2018-03-24 ENCOUNTER — Other Ambulatory Visit: Payer: Self-pay | Admitting: Family Medicine

## 2018-03-24 DIAGNOSIS — I10 Essential (primary) hypertension: Secondary | ICD-10-CM

## 2018-04-08 ENCOUNTER — Other Ambulatory Visit: Payer: Self-pay | Admitting: Family Medicine

## 2018-04-08 DIAGNOSIS — I10 Essential (primary) hypertension: Secondary | ICD-10-CM

## 2018-04-08 DIAGNOSIS — R3914 Feeling of incomplete bladder emptying: Secondary | ICD-10-CM

## 2018-04-08 DIAGNOSIS — N401 Enlarged prostate with lower urinary tract symptoms: Secondary | ICD-10-CM

## 2018-04-18 ENCOUNTER — Other Ambulatory Visit: Payer: BLUE CROSS/BLUE SHIELD

## 2018-04-18 DIAGNOSIS — Z0000 Encounter for general adult medical examination without abnormal findings: Secondary | ICD-10-CM

## 2018-04-18 DIAGNOSIS — E039 Hypothyroidism, unspecified: Secondary | ICD-10-CM

## 2018-04-18 DIAGNOSIS — R3911 Hesitancy of micturition: Secondary | ICD-10-CM

## 2018-04-18 DIAGNOSIS — R7309 Other abnormal glucose: Secondary | ICD-10-CM

## 2018-04-18 DIAGNOSIS — Z1159 Encounter for screening for other viral diseases: Secondary | ICD-10-CM

## 2018-04-18 DIAGNOSIS — E782 Mixed hyperlipidemia: Secondary | ICD-10-CM

## 2018-04-18 DIAGNOSIS — I10 Essential (primary) hypertension: Secondary | ICD-10-CM

## 2018-04-18 DIAGNOSIS — N401 Enlarged prostate with lower urinary tract symptoms: Secondary | ICD-10-CM

## 2018-04-25 ENCOUNTER — Ambulatory Visit (INDEPENDENT_AMBULATORY_CARE_PROVIDER_SITE_OTHER): Payer: BLUE CROSS/BLUE SHIELD | Admitting: Family Medicine

## 2018-04-25 ENCOUNTER — Encounter: Payer: Self-pay | Admitting: Family Medicine

## 2018-04-25 VITALS — BP 158/74 | HR 62 | Resp 15 | Ht 67.0 in | Wt 218.8 lb

## 2018-04-25 DIAGNOSIS — G8929 Other chronic pain: Secondary | ICD-10-CM

## 2018-04-25 DIAGNOSIS — E782 Mixed hyperlipidemia: Secondary | ICD-10-CM | POA: Diagnosis not present

## 2018-04-25 DIAGNOSIS — N401 Enlarged prostate with lower urinary tract symptoms: Secondary | ICD-10-CM

## 2018-04-25 DIAGNOSIS — E039 Hypothyroidism, unspecified: Secondary | ICD-10-CM

## 2018-04-25 DIAGNOSIS — E669 Obesity, unspecified: Secondary | ICD-10-CM

## 2018-04-25 DIAGNOSIS — R3911 Hesitancy of micturition: Secondary | ICD-10-CM

## 2018-04-25 DIAGNOSIS — I10 Essential (primary) hypertension: Secondary | ICD-10-CM

## 2018-04-25 DIAGNOSIS — Z0000 Encounter for general adult medical examination without abnormal findings: Secondary | ICD-10-CM | POA: Diagnosis not present

## 2018-04-25 DIAGNOSIS — R7309 Other abnormal glucose: Secondary | ICD-10-CM | POA: Diagnosis not present

## 2018-04-25 DIAGNOSIS — R7303 Prediabetes: Secondary | ICD-10-CM

## 2018-04-25 DIAGNOSIS — M25561 Pain in right knee: Secondary | ICD-10-CM | POA: Diagnosis not present

## 2018-04-25 DIAGNOSIS — R29818 Other symptoms and signs involving the nervous system: Secondary | ICD-10-CM | POA: Insufficient documentation

## 2018-04-25 NOTE — Progress Notes (Signed)
Subjective:    Patient ID: Todd Burns, male    DOB: [birth year], 58 y.o.   MRN: [id number]  Todd Burns is a 58 y.o. male presenting on 04/25/2018 for Annual Exam   HPI   Here for Annual Physical and Lab Review.  Pre-Diabetes / Obesity BMI >34 / Hyperlipidemia Last A1c 5.9 (01/2018). Result of lipid panel Not checking CBG Meds:not taking meds Currentlyon [facility] Lifestyle: - Weight up 3 lbs, previously down 5 lbs - Diet (low carb, low sugar, improving)  - Exercise (limited due to knee pain) Denies hypoglycemia  CHRONIC HTN: Last visit 10/2017 his amlodipine was increased from 5 to 10mg . He felt better, some improved headache. He has not checked BP at home regularly, his wife has not been able to. He admits some knee pain. Current Meds -Amlodipine 10mg daily, Losartan 100mg  daily - Past meds include HCTZ stopped due to ED, Carvedilol - stopped coreg due to too low BP in 2016 was on for pre-op shoulder surgery Reports good compliance, took meds today. Tolerating well, w/o complaints.  Admits some life stressors  Follow-up Right Knee Pain / Torn Meniscus Followed by [facility], history of torn R meniscus, has deferred surgery Still has episodic pain R knee - He has prior history of Right knee surgical repair of MCL and possibly meniscus injury performed years ago, 1999 on chart review Able to ambulate without assistance  BPH On Finasteride and Tamsulosin No new concerns.  Suspected Sleep Apnea / Poor Sleep Reports problem for past few years, now worsening in past few months, worsening sleep. He was concerned for sleep apnea before, never tested. He says wife reports witnessed sleep apnea episode and disordered breathing and noises. He does not recall waking up much overnight, but he is excessively tired and sleepy during day and dozes off, affecting his daily function, only few hours of sleep, some related insomnia.  Epworth Sleepiness Scale Total Score: 12 Sitting and  reading - 1 Watching TV - 2 Sitting inactive in a public place - 2 As a passenger in a car for an hour without a break - 2 Lying down to rest in the afternoon when circumstances permit - 3 Sitting and talking to someone - 0 Sitting quietly after a lunch without alcohol - 2 In a car, while stopped for a few minutes in traffic - 0  STOP-Bang OSA scoring Snoring yes   Tiredness yes   Observed apneas yes   Pressure HTN yes   BMI > 35 kg/m2 no   Age > 85  yes   Neck (male >17 in; Male >16 in)  yes 26"  Gender male yes   OSA risk low (0-2)  OSA risk intermediate (3-4)  OSA risk high (5+)  Total: 7 High Risk     Depression screen [facility] 2/9 10/10/2017 04/19/2017 03/22/2017  Decreased Interest 0 0 0  Down, Depressed, Hopeless 0 0 0  PHQ - 2 Score 0 0 0    Past Medical History:  Diagnosis Date  . BPH (benign prostatic hyperplasia)   . Hypothyroidism    Past Surgical History:  Procedure Laterality Date  . COLONOSCOPY N/A 08/08/2014   Procedure: COLONOSCOPY;  Surgeon: Lucilla Lame, MD;  Location: [place];  Service: Gastroenterology;  Laterality: N/A;  . MEDIAL COLLATERAL LIGAMENT REPAIR, KNEE  1999  . SHOULDER ARTHROSCOPY WITH ROTATOR CUFF REPAIR Right 10/01/2014   Procedure: SHOULDER ARTHROSCOPY WITH ROTATOR CUFF REPAIR;  Surgeon: Earnestine Leys, MD;  Location: [facility];  Service: Orthopedics;  Laterality: Right;  Mini open rotator cuff repair Arthroscopy  Distal clavicle excision Subacromial decompression   Social History   Socioeconomic History  . Marital status: Married    Spouse name: Not on file  . Number of children: Not on file  . Years of education: Not on file  . Highest education level: Not on file  Occupational History  . Occupation: Radiographer, therapeutic  Social Needs  . Financial resource strain: Not on file  . Food insecurity:    Worry: Not on file    Inability: Not on file  . Transportation needs:    Medical: Not on file    Non-medical: Not on file    Tobacco Use  . Smoking status: Current Every Day Smoker    Packs/day: 1.00    Years: 15.00    Pack years: 15.00  . Smokeless tobacco: Current User  Substance and Sexual Activity  . Alcohol use: Yes    Comment: occ  . Drug use: No  . Sexual activity: Yes  Lifestyle  . Physical activity:    Days per week: Not on file    Minutes per session: Not on file  . Stress: Not on file  Relationships  . Social connections:    Talks on phone: Not on file    Gets together: Not on file    Attends religious service: Not on file    Active member of club or organization: Not on file    Attends meetings of clubs or organizations: Not on file    Relationship status: Not on file  . Intimate partner violence:    Fear of current or ex partner: Not on file    Emotionally abused: Not on file    Physically abused: Not on file    Forced sexual activity: Not on file  Other Topics Concern  . Not on file  Social History Narrative  . Not on file   Family History  Problem Relation Age of Onset  . Cancer Mother        thyroid  . Cancer Father        lung  . Thyroid disease Brother   . Cancer Maternal Aunt        thyroid  . Cancer Sister   . Thyroid disease Brother   . Cancer Sister    Current Outpatient Medications on File Prior to Visit  Medication Sig  . amLODipine (NORVASC) 10 MG tablet Take 1 tablet (10 mg total) by mouth daily.  . baclofen (LIORESAL) 10 MG tablet Take 0.5-1 tablets (5-10 mg total) by mouth 3 (three) times daily as needed for muscle spasms.  . finasteride (PROSCAR) 5 MG tablet Take 1 tablet (5 mg total) by mouth daily.  . fluticasone (FLONASE) 50 MCG/ACT nasal spray Place 2 sprays into both nostrils daily. Use for 4-6 weeks then stop and use seasonally or as needed.  Marland Kitchen levothyroxine (SYNTHROID, LEVOTHROID) 175 MCG tablet Take 1 tablet (175 mcg total) by mouth daily before breakfast.  . losartan (COZAAR) 100 MG tablet TAKE 1 TABLET BY MOUTH EVERY DAY  . naproxen (NAPROSYN)  500 MG tablet TAKE 1 TABLET (500 MG TOTAL) BY MOUTH 2 (TWO) TIMES DAILY WITH A MEAL. FOR 2-4 WEEKS THEN AS NEEDED  . tamsulosin (FLOMAX) 0.4 MG CAPS capsule TAKE 1 CAPSULE (0.4 MG TOTAL) BY MOUTH 2 (TWO) TIMES DAILY.  Marland Kitchen triamcinolone cream (KENALOG) 0.1 % Apply 1 application topically 2 (two) times daily. As needed for bug bites  No current facility-administered medications on file prior to visit.     Review of Systems  Constitutional: Negative for activity change, appetite change, chills, diaphoresis, fatigue and fever.  HENT: Negative for congestion and hearing loss.   Eyes: Negative for visual disturbance.  Respiratory: Positive for apnea. Negative for cough, chest tightness, shortness of breath and wheezing.   Cardiovascular: Negative for chest pain, palpitations and leg swelling.  Gastrointestinal: Positive for constipation (improved on more water now). Negative for abdominal pain, diarrhea, nausea and vomiting.  Endocrine: Negative for cold intolerance.  Genitourinary: Negative for decreased urine volume, dysuria, frequency, hematuria and urgency.  Musculoskeletal: Negative for arthralgias, back pain and neck pain.  Skin: Negative for rash.  Allergic/Immunologic: Negative for environmental allergies.  Neurological: Negative for dizziness, weakness, light-headedness, numbness and headaches.  Hematological: Negative for adenopathy.  Psychiatric/Behavioral: Negative for behavioral problems, dysphoric mood and sleep disturbance. The patient is not nervous/anxious.    Per HPI unless specifically indicated above      Objective:    BP (!) 158/74 (BP Location: Left Arm, Cuff Size: Normal)   Pulse 62   Resp 15   Ht 5\' 7"  (1.702 m)   Wt 218 lb 12.8 oz (99.2 kg)   SpO2 98%   BMI 34.27 kg/m   Wt Readings from Last 3 Encounters:  04/25/18 218 lb 12.8 oz (99.2 kg)  03/20/18 215 lb (97.5 kg)  01/18/18 220 lb 6.4 oz (100 kg)    Physical Exam Vitals signs and nursing note reviewed.    Constitutional:      General: He is not in acute distress.    Appearance: He is well-developed. He is not diaphoretic.     Comments: Well-appearing, comfortable, cooperative  HENT:     Head: Normocephalic and atraumatic.     Comments: Frontal / maxillary sinuses non-tender. Nares patent without purulence or edema. Bilateral TMs clear without erythema, effusion or bulging. Oropharynx clear without erythema, exudates, edema or asymmetry. Eyes:     General:        Right eye: No discharge.        Left eye: No discharge.     Conjunctiva/sclera: Conjunctivae normal.     Pupils: Pupils are equal, round, and reactive to light.  Neck:     Musculoskeletal: Normal range of motion and neck supple.     Thyroid: No thyromegaly.     Comments: Neck circumference 18" Cardiovascular:     Rate and Rhythm: Normal rate and regular rhythm.     Heart sounds: Normal heart sounds. No murmur.  Pulmonary:     Effort: Pulmonary effort is normal. No respiratory distress.     Breath sounds: Normal breath sounds. No wheezing or rales.  Abdominal:     General: Bowel sounds are normal. There is no distension.     Palpations: Abdomen is soft. There is no mass.     Tenderness: There is no abdominal tenderness.  Musculoskeletal:        General: No tenderness.     Comments: Upper / Lower Extremities: - Normal muscle tone, strength bilateral upper extremities 5/5, lower extremities 5/5  R Knee Mostly full ROM some crepitus and discomfort on palpation.  Lymphadenopathy:     Cervical: No cervical adenopathy.  Skin:    General: Skin is warm and dry.     Findings: No erythema or rash.     Comments: Few darker spots on back consistent with SK  Neurological:     Mental Status: He is  alert and oriented to person, place, and time.     Comments: Distal sensation intact to light touch all extremities  Psychiatric:        Behavior: Behavior normal.     Comments: Well groomed, good eye contact, normal speech and  thoughts    Results for orders placed or performed in visit on 04/18/18  Lipid panel  Result Value Ref Range   Cholesterol 223 (H) <200 mg/dL   HDL 53 > OR = 40 mg/dL   Triglycerides 111 <150 mg/dL   LDL Cholesterol (Calc) 147 (H) mg/dL (calc)   Total CHOL/HDL Ratio 4.2 <5.0 (calc)   Non-HDL Cholesterol (Calc) 170 (H) <130 mg/dL (calc)  COMPLETE METABOLIC PANEL WITH GFR  Result Value Ref Range   Glucose, Bld 91 65 - 99 mg/dL   BUN 14 7 - 25 mg/dL   Creat 1.09 0.70 - 1.33 mg/dL   GFR, Est Non African American 74 > OR = 60 mL/min/1.31m2   GFR, Est African American 86 > OR = 60 mL/min/1.77m2   BUN/Creatinine Ratio NOT APPLICABLE 6 - 22 (calc)   Sodium 138 135 - 146 mmol/L   Potassium 3.9 3.5 - 5.3 mmol/L   Chloride 106 98 - 110 mmol/L   CO2 24 20 - 32 mmol/L   Calcium 9.1 8.6 - 10.3 mg/dL   Total Protein 7.6 6.1 - 8.1 g/dL   Albumin 4.1 3.6 - 5.1 g/dL   Globulin 3.5 1.9 - 3.7 g/dL (calc)   AG Ratio 1.2 1.0 - 2.5 (calc)   Total Bilirubin 0.8 0.2 - 1.2 mg/dL   Alkaline phosphatase (APISO) 57 35 - 144 U/L   AST 27 10 - 35 U/L   ALT 19 9 - 46 U/L  CBC with Differential/Platelet  Result Value Ref Range   WBC 6.5 3.8 - 10.8 Thousand/uL   RBC 4.93 4.20 - 5.80 Million/uL   Hemoglobin 13.7 13.2 - 17.1 g/dL   HCT 41.0 38.5 - 50.0 %   MCV 83.2 80.0 - 100.0 fL   MCH 27.8 27.0 - 33.0 pg   MCHC 33.4 32.0 - 36.0 g/dL   RDW 16.1 (H) 11.0 - 15.0 %   Platelets 176 140 - 400 Thousand/uL   MPV 12.3 7.5 - 12.5 fL   Neutro Abs 3,283 1,500 - 7,800 cells/uL   Lymphs Abs 2,373 850 - 3,900 cells/uL   Absolute Monocytes 579 200 - 950 cells/uL   Eosinophils Absolute 189 15 - 500 cells/uL   Basophils Absolute 78 0 - 200 cells/uL   Neutrophils Relative % 50.5 %   Total Lymphocyte 36.5 %   Monocytes Relative 8.9 %   Eosinophils Relative 2.9 %   Basophils Relative 1.2 %      Assessment & Plan:   Problem List Items Addressed This Visit    BPH (benign prostatic hyperplasia)    Chronic BPH  with some persistent refractory symptoms LUTS Followed by [facility] - On Finasteride 5mg , Tamsulosin 0.4mg  BID - No known personal/family history of prostate CA      Chronic pain of right knee    Followed by [facility] Persistent R knee pain seems stable now, w/o active pain today, remains functional Has deferred meniscus repair surgery for now      Essential hypertension    Still Uncontrolled HTN, w/o active knee pain. Already increased meds last visit. - Home BP readings not available - not checking No known complications Prior meds HCTZ 25mg  (declines due to ED), Carvedilol  Plan:  1. CONTINUE Losartan 100mg  daily, Amlodipine 5mg  2. Encourage improved lifestyle - low sodium diet, regular exercise 3. AGAIN Need to start weekly monitor BP outside office, bring readings to next visit, if persistently >140/90 or new symptoms notify office sooner 4. Follow-up 3 months - or sooner - if BP remains elevated >150 will consider add additional agent, possible BB again - or possibly spiro vs hydralazine vs BB - CONSIDER Referral to Cardiology again - agree to defer once more      Hypothyroidism    Check TSH      Mixed hyperlipidemia    Check fasting lipid Counseling on statin therapy due to elevated ASCVD risk      Obesity (BMI 30.0-34.9)    Encourage weight loss lifestyle changes      Pre-diabetes    Stable PreDM prior Concern with obesity, HTN, HLD  Plan:  Check A1c 1. Not on any therapy currently 2. Encourage improved lifestyle - low carb, low sugar diet, stop soda - switch to diet, reduce portion size, continue improving regular exercise 3. Follow-up 3 months      Suspected sleep apnea    Persistent clinical concern for suspected obstructive sleep apnea given reported symptoms with witnessed apnea, snoring and sleep disturbance, fatigue excessive sleepiness. - Screening: ESS score 12 / STOP-Bang Score 7 - Neck Circumference: 18" - Co-morbidities: HTN  Plan: 1.  Discussion on initial diagnosis and testing for OSA, risk factors, management, complications 2. Agree to proceed with sleep study testing based on clinical concerns - referral sent [place] to be faxed 04/26/18       Other Visit Diagnoses    Annual physical exam    -  Primary      Updated Health Maintenance information Reviewed recent lab results with patient Encouraged improvement to lifestyle with diet and exercise - Goal of weight loss   No orders of the defined types were placed in this encounter.   Follow up plan: Return in about 3 months (around 07/24/2018) for HTN, PreDM A1c, OSA sleep study.   Fasting labs today - call next week  [name], DO [facility] 04/25/2018, 8:34 AM

## 2018-04-25 NOTE — Patient Instructions (Addendum)
Thank you for coming to the office today.  Referral to [place] - stay tuned for sleep study - at lab or in home.  Keep on current BP medications, no change. If no improvement from sleep study / treatment then we can refer you to a Cardiologist for 2nd opinion on BP  In future may adjust med again   Stay tuned for labs today will call next week with results  Previous sugar was good, keep up the good work.  Spots on back look fine, no sign of skin cancer at this time.  Please schedule a Follow-up Appointment to: Return in about 3 months (around 07/24/2018) for HTN, PreDM A1c, OSA sleep study.  If you have any other questions or concerns, please feel free to call the office or send a message through [facility]. You may also schedule an earlier appointment if necessary.  Additionally, you may be receiving a survey about your experience at our office within a few days to 1 week by e-mail or mail. We value your feedback.  [name], DO [place]

## 2018-04-26 ENCOUNTER — Encounter: Payer: Self-pay | Admitting: Family Medicine

## 2018-04-26 LAB — COMPLETE METABOLIC PANEL WITH GFR
AG Ratio: 1.2 (calc) (ref 1.0–2.5)
ALT: 19 U/L (ref 9–46)
AST: 27 U/L (ref 10–35)
Albumin: 4.1 g/dL (ref 3.6–5.1)
Alkaline phosphatase (APISO): 57 U/L (ref 35–144)
BUN: 14 mg/dL (ref 7–25)
CO2: 24 mmol/L (ref 20–32)
Calcium: 9.1 mg/dL (ref 8.6–10.3)
Chloride: 106 mmol/L (ref 98–110)
Creat: 1.09 mg/dL (ref 0.70–1.33)
GFR, Est African American: 86 mL/min/{1.73_m2} (ref >=60)
GFR, Est Non African American: 74 mL/min/{1.73_m2} (ref >=60)
Globulin: 3.5 g/dL (calc) (ref 1.9–3.7)
Glucose, Bld: 91 mg/dL (ref 65–99)
Potassium: 3.9 mmol/L (ref 3.5–5.3)
Sodium: 138 mmol/L (ref 135–146)
Total Bilirubin: 0.8 mg/dL (ref 0.2–1.2)
Total Protein: 7.6 g/dL (ref 6.1–8.1)

## 2018-04-26 LAB — CBC WITH DIFFERENTIAL/PLATELET
Absolute Monocytes: 579 cells/uL (ref 200–950)
Basophils Absolute: 78 cells/uL (ref 0–200)
Basophils Relative: 1.2 %
Eosinophils Absolute: 189 cells/uL (ref 15–500)
Eosinophils Relative: 2.9 %
HCT: 41 % (ref 38.5–50.0)
Hemoglobin: 13.7 g/dL (ref 13.2–17.1)
Lymphs Abs: 2373 cells/uL (ref 850–3900)
MCH: 27.8 pg (ref 27.0–33.0)
MCHC: 33.4 g/dL (ref 32.0–36.0)
MCV: 83.2 fL (ref 80.0–100.0)
MPV: 12.3 fL (ref 7.5–12.5)
Monocytes Relative: 8.9 %
Neutro Abs: 3283 cells/uL (ref 1500–7800)
Neutrophils Relative %: 50.5 %
Platelets: 176 10*3/uL (ref 140–400)
RBC: 4.93 10*6/uL (ref 4.20–5.80)
RDW: 16.1 % — ABNORMAL HIGH (ref 11.0–15.0)
Total Lymphocyte: 36.5 %
WBC: 6.5 10*3/uL (ref 3.8–10.8)

## 2018-04-26 LAB — HEMOGLOBIN A1C
Hgb A1c MFr Bld: 5.6 % of total Hgb (ref <5.7)
Mean Plasma Glucose: 114 (calc)
eAG (mmol/L): 6.3 (calc)

## 2018-04-26 LAB — LIPID PANEL
Cholesterol: 223 mg/dL — ABNORMAL HIGH (ref <200)
HDL: 53 mg/dL (ref >=40)
LDL Cholesterol (Calc): 147 mg/dL (calc) — ABNORMAL HIGH
Non-HDL Cholesterol (Calc): 170 mg/dL (calc) — ABNORMAL HIGH (ref <130)
Total CHOL/HDL Ratio: 4.2 (calc) (ref <5.0)
Triglycerides: 111 mg/dL (ref <150)

## 2018-04-26 LAB — PSA: PSA: 3.2 ng/mL (ref <=4.0)

## 2018-04-26 LAB — TSH: TSH: 3.96 mIU/L (ref 0.40–4.50)

## 2018-04-26 LAB — HEPATITIS C ANTIBODY
Hepatitis C Ab: NONREACTIVE
SIGNAL TO CUT-OFF: 0.45 (ref <1.00)

## 2018-04-26 LAB — T4, FREE: Free T4: 1.6 ng/dL (ref 0.8–1.8)

## 2018-04-26 NOTE — Assessment & Plan Note (Signed)
Check fasting lipid Counseling on statin therapy due to elevated ASCVD risk

## 2018-04-26 NOTE — Assessment & Plan Note (Signed)
Persistent clinical concern for suspected obstructive sleep apnea given reported symptoms with witnessed apnea, snoring and sleep disturbance, fatigue excessive sleepiness. - Screening: ESS score 12 / STOP-Bang Score 7 - Neck Circumference: 18" - Co-morbidities: HTN  Plan: 1. Discussion on initial diagnosis and testing for OSA, risk factors, management, complications 2. Agree to proceed with sleep study testing based on clinical concerns - referral sent [place] to be faxed 04/26/18

## 2018-04-26 NOTE — Assessment & Plan Note (Signed)
Chronic BPH with some persistent refractory symptoms LUTS Followed by [facility] - On Finasteride 5mg, Tamsulosin 0.4mg BID - No known personal/family history of prostate CA 

## 2018-04-26 NOTE — Assessment & Plan Note (Signed)
Check TSH 

## 2018-04-26 NOTE — Assessment & Plan Note (Signed)
Still Uncontrolled HTN, w/o active knee pain. Already increased meds last visit. - Home BP readings not available - not checking No known complications Prior meds HCTZ 25mg  (declines due to ED), Carvedilol     Plan:  1. CONTINUE Losartan 100mg  daily, Amlodipine 5mg  2. Encourage improved lifestyle - low sodium diet, regular exercise 3. AGAIN Need to start weekly monitor BP outside office, bring readings to next visit, if persistently >140/90 or new symptoms notify office sooner 4. Follow-up 3 months - or sooner - if BP remains elevated >150 will consider add additional agent, possible BB again - or possibly spiro vs hydralazine vs BB - CONSIDER Referral to Cardiology again - agree to defer once more

## 2018-04-26 NOTE — Assessment & Plan Note (Signed)
Stable PreDM prior Concern with obesity, HTN, HLD  Plan:  Check A1c 1. Not on any therapy currently 2. Encourage improved lifestyle - low carb, low sugar diet, stop soda - switch to diet, reduce portion size, continue improving regular exercise 3. Follow-up 3 months

## 2018-04-26 NOTE — Assessment & Plan Note (Signed)
Encourage weight loss lifestyle changes 

## 2018-04-26 NOTE — Assessment & Plan Note (Signed)
Followed by [facility] Persistent R knee pain seems stable now, w/o active pain today, remains functional Has deferred meniscus repair surgery for now

## 2018-04-30 ENCOUNTER — Emergency Department
Admission: EM | Admit: 2018-04-30 | Discharge: 2018-04-30 | Disposition: A | Discharge status: Home / Self Care | Payer: Worker's Compensation | Attending: Emergency Medicine | Admitting: Emergency Medicine

## 2018-04-30 ENCOUNTER — Emergency Department: Payer: Worker's Compensation

## 2018-04-30 ENCOUNTER — Encounter: Payer: Self-pay | Admitting: Medical Oncology

## 2018-04-30 DIAGNOSIS — Y9389 Activity, other specified: Secondary | ICD-10-CM | POA: Diagnosis not present

## 2018-04-30 DIAGNOSIS — W268XXA Contact with other sharp object(s), not elsewhere classified, initial encounter: Secondary | ICD-10-CM | POA: Diagnosis not present

## 2018-04-30 DIAGNOSIS — E039 Hypothyroidism, unspecified: Secondary | ICD-10-CM | POA: Insufficient documentation

## 2018-04-30 DIAGNOSIS — Y990 Civilian activity done for income or pay: Secondary | ICD-10-CM | POA: Diagnosis not present

## 2018-04-30 DIAGNOSIS — I10 Essential (primary) hypertension: Secondary | ICD-10-CM | POA: Insufficient documentation

## 2018-04-30 DIAGNOSIS — S61441A Puncture wound with foreign body of right hand, initial encounter: Secondary | ICD-10-CM | POA: Insufficient documentation

## 2018-04-30 DIAGNOSIS — Y9269 Other specified industrial and construction area as the place of occurrence of the external cause: Secondary | ICD-10-CM | POA: Insufficient documentation

## 2018-04-30 DIAGNOSIS — F17210 Nicotine dependence, cigarettes, uncomplicated: Secondary | ICD-10-CM | POA: Diagnosis not present

## 2018-04-30 DIAGNOSIS — R7303 Prediabetes: Secondary | ICD-10-CM | POA: Insufficient documentation

## 2018-04-30 DIAGNOSIS — Z79899 Other long term (current) drug therapy: Secondary | ICD-10-CM | POA: Diagnosis not present

## 2018-04-30 DIAGNOSIS — S6991XA Unspecified injury of right wrist, hand and finger(s), initial encounter: Secondary | ICD-10-CM | POA: Diagnosis present

## 2018-04-30 DIAGNOSIS — S60559A Superficial foreign body of unspecified hand, initial encounter: Secondary | ICD-10-CM

## 2018-04-30 MED ORDER — HYDROCODONE-ACETAMINOPHEN 5-325 MG PO TABS: 1.0000 | ORAL_TABLET | Freq: Four times a day (QID) | ORAL | 0 refills | Status: AC | PRN

## 2018-04-30 MED ORDER — AMOXICILLIN-POT CLAVULANATE 875-125 MG PO TABS: 1.0000 | ORAL_TABLET | Freq: Two times a day (BID) | ORAL | 0 refills | Status: DC

## 2018-04-30 MED ORDER — LIDOCAINE HCL (PF) 1 % IJ SOLN
5.0000 mL | Freq: Once | INTRAMUSCULAR | Status: DC
  Filled 2018-04-30: qty 5

## 2018-04-30 NOTE — ED Provider Notes (Signed)
[facility] Emergency Department Provider Note  ____________________________________________  Time seen: Approximately 6:15 PM  I have reviewed the triage vital signs and the nursing notes.   HISTORY  Chief Complaint Foreign Body   HPI Todd Burns is a 58 y.o. male to the emergency department for treatment and evaluation of an industrial knitting needle embedded in the palm of his right hand.  Patient states that he was changing the needles out on the machine about 10 minutes prior to getting off work and it slipped and lodged into the palm. Tdap is up to date.   Past Medical History:  Diagnosis Date  . BPH (benign prostatic hyperplasia)   . Hypothyroidism     Patient Active Problem List   Diagnosis Date Noted  . Suspected sleep apnea 04/25/2018  . Obesity (BMI 30.0-34.9) 01/18/2018  . Pre-diabetes 04/15/2017  . Chronic pain of right knee 03/23/2017  . S/P rotator cuff surgery 03/23/2017  . Mixed hyperlipidemia 03/23/2017  . Essential hypertension 03/22/2017  . Hypothyroidism 03/22/2017  . BPH (benign prostatic hyperplasia) 03/22/2017    Past Surgical History:  Procedure Laterality Date  . COLONOSCOPY N/A 08/08/2014   Procedure: COLONOSCOPY;  Surgeon: Lucilla Lame, MD;  Location: [place];  Service: Gastroenterology;  Laterality: N/A;  . MEDIAL COLLATERAL LIGAMENT REPAIR, KNEE  1999  . SHOULDER ARTHROSCOPY WITH ROTATOR CUFF REPAIR Right 10/01/2014   Procedure: SHOULDER ARTHROSCOPY WITH ROTATOR CUFF REPAIR;  Surgeon: Earnestine Leys, MD;  Location: [facility];  Service: Orthopedics;  Laterality: Right;  Mini open rotator cuff repair Arthroscopy  Distal clavicle excision Subacromial decompression    Prior to Admission medications   Medication Sig Start Date End Date Taking? Authorizing Provider  amLODipine (NORVASC) 10 MG tablet Take 1 tablet (10 mg total) by mouth daily. 03/26/18   Karamalegos, Devonne Doughty, DO  amoxicillin-clavulanate  (AUGMENTIN) 875-125 MG tablet Take 1 tablet by mouth 2 (two) times daily. 04/30/18   Isaia Hassell, Johnette Abraham B, FNP  finasteride (PROSCAR) 5 MG tablet Take 1 tablet (5 mg total) by mouth daily. 10/10/17   Karamalegos, Devonne Doughty, DO  fluticasone (FLONASE) 50 MCG/ACT nasal spray Place 2 sprays into both nostrils daily. Use for 4-6 weeks then stop and use seasonally or as needed. 03/22/17   Karamalegos, Devonne Doughty, DO  HYDROcodone-acetaminophen (NORCO/VICODIN) 5-325 MG tablet Take 1 tablet by mouth every 6 (six) hours as needed for up to 3 days for severe pain. 04/30/18 05/03/18  Victorino Dike, FNP  levothyroxine (SYNTHROID, LEVOTHROID) 175 MCG tablet Take 1 tablet (175 mcg total) by mouth daily before breakfast. 04/19/17   Parks Ranger, Devonne Doughty, DO  losartan (COZAAR) 100 MG tablet TAKE 1 TABLET BY MOUTH EVERY DAY 04/09/18   Karamalegos, Devonne Doughty, DO  tamsulosin (FLOMAX) 0.4 MG CAPS capsule TAKE 1 CAPSULE (0.4 MG TOTAL) BY MOUTH 2 (TWO) TIMES DAILY. 04/09/18   Karamalegos, Devonne Doughty, DO  triamcinolone cream (KENALOG) 0.1 % Apply 1 application topically 2 (two) times daily. As needed for bug bites 10/10/17   Olin Hauser, DO    Allergies Lisinopril  Family History  Problem Relation Age of Onset  . Cancer Mother        thyroid  . Cancer Father        lung  . Thyroid disease Brother   . Cancer Maternal Aunt        thyroid  . Cancer Sister   . Thyroid disease Brother   . Cancer Sister     Social  History Social History   Tobacco Use  . Smoking status: Current Every Day Smoker    Packs/day: 1.00    Years: 15.00    Pack years: 15.00  . Smokeless tobacco: Current User  Substance Use Topics  . Alcohol use: Yes    Comment: occ  . Drug use: No    Review of Systems  Constitutional: Negative for fever. Respiratory: Negative for cough or shortness of breath.  Musculoskeletal: Negative for myalgias Skin: Positive for foreign body in right palm Neurological: Negative for numbness or  paresthesias. ____________________________________________   PHYSICAL EXAM:  VITAL SIGNS: ED Triage Vitals  Enc Vitals Group     BP 04/30/18 1728 (!) 160/70     Pulse Rate 04/30/18 1728 67     Resp 04/30/18 1728 16     Temp 04/30/18 1728 98 F (36.7 C)     Temp Source 04/30/18 1728 Oral     SpO2 04/30/18 1728 (!) 9 %     Weight 04/30/18 1706 218 lb 4.1 oz (99 kg)     Height 04/30/18 1706 5\' 7"  (1.702 m)     Head Circumference --      Peak Flow --      Pain Score 04/30/18 1706 0     Pain Loc --      Pain Edu? --      Excl. in [place]? --      Constitutional: Well appearing. Eyes: Conjunctivae are clear without discharge or drainage. Nose: No rhinorrhea noted. Mouth/Throat: Airway is patent.  Neck: No stridor. Unrestricted range of motion observed. Cardiovascular: Capillary refill is <3 seconds.  Respiratory: Respirations are even and unlabored.. Musculoskeletal: Unrestricted range of motion observed. Neurologic: Awake, alert, and oriented x 4.  Skin: Industrial knitting needle embedded in right palm.  Patient has full range of motion of the hand and fingers.  ____________________________________________   LABS (all labs ordered are listed, but only abnormal results are displayed)  Labs Reviewed - No data to display ____________________________________________  EKG  Not indicated. ____________________________________________  RADIOLOGY  Hook needle embedded in the soft tissue in the palm.  No bony abnormality. ____________________________________________   PROCEDURES  .Foreign Body Removal Date/Time: 04/30/2018 6:39 PM Performed by: Victorino Dike, FNP Authorized by: Victorino Dike, FNP  Consent: Verbal consent obtained. Consent given by: patient Body area: skin General location: upper extremity Location details: right hand Anesthesia: local infiltration  Anesthesia: Local Anesthetic: lidocaine 1% without epinephrine Anesthetic total: 3 mL Depth:  deep Complexity: simple 1 objects recovered. Objects recovered: Hooked knitting needle Post-procedure assessment: foreign body removed Comments: Hook appears to be intact after removal.   ____________________________________________   INITIAL IMPRESSION / ASSESSMENT AND PLAN / ED COURSE  Todd Burns is a 59 y.o. male presents to the emergency department for treatment and evaluation of right hand foreign body.  Needle was removed intact.  Patient tolerated the procedure well.  Patient will be treated with Augmentin and a few Norco.  He was encouraged to follow-up with primary care for symptoms of concern.  He is to return to the emergency department for symptoms change or worsen if unable to schedule appointment.  Patient states that he has already completed the Worker's Compensation required testing at urgent care prior to arrival.   Medications  lidocaine (PF) (XYLOCAINE) 1 % injection 5 mL (has no administration in time range)     Pertinent labs & imaging results that were available during my care of the patient were reviewed  by me and considered in my medical decision making (see chart for details).  ____________________________________________   FINAL CLINICAL IMPRESSION(S) / ED DIAGNOSES  Final diagnoses:  Foreign body in hand, initial encounter    ED Discharge Orders         Ordered    HYDROcodone-acetaminophen (NORCO/VICODIN) 5-325 MG tablet  Every 6 hours PRN     04/30/18 1833    amoxicillin-clavulanate (AUGMENTIN) 875-125 MG tablet  2 times daily     04/30/18 1833           Note:  This document was prepared using Dragon voice recognition software and may include unintentional dictation errors.    Victorino Dike, FNP 04/30/18 [facility]    Nance Pear, MD 04/30/18 [phone]

## 2018-04-30 NOTE — ED Triage Notes (Signed)
Pt here with knitting needle to rt hand. WC.

## 2018-04-30 NOTE — Discharge Instructions (Signed)
Please follow up with the primary care provider for symptoms of concern.   If unable to see primary care, return to the ER.

## 2018-06-14 ENCOUNTER — Other Ambulatory Visit: Payer: Self-pay | Admitting: Family Medicine

## 2018-06-14 DIAGNOSIS — E039 Hypothyroidism, unspecified: Secondary | ICD-10-CM

## 2018-07-13 ENCOUNTER — Other Ambulatory Visit: Payer: Self-pay | Admitting: Family Medicine

## 2018-07-13 DIAGNOSIS — N401 Enlarged prostate with lower urinary tract symptoms: Secondary | ICD-10-CM

## 2018-07-13 DIAGNOSIS — R3914 Feeling of incomplete bladder emptying: Secondary | ICD-10-CM

## 2018-08-02 ENCOUNTER — Ambulatory Visit (INDEPENDENT_AMBULATORY_CARE_PROVIDER_SITE_OTHER): Payer: BLUE CROSS/BLUE SHIELD | Admitting: Family Medicine

## 2018-08-02 ENCOUNTER — Other Ambulatory Visit: Payer: Self-pay

## 2018-08-02 DIAGNOSIS — N401 Enlarged prostate with lower urinary tract symptoms: Secondary | ICD-10-CM | POA: Diagnosis not present

## 2018-08-02 DIAGNOSIS — R3914 Feeling of incomplete bladder emptying: Secondary | ICD-10-CM

## 2018-08-02 DIAGNOSIS — E039 Hypothyroidism, unspecified: Secondary | ICD-10-CM

## 2018-08-02 DIAGNOSIS — R7303 Prediabetes: Secondary | ICD-10-CM

## 2018-08-02 DIAGNOSIS — I10 Essential (primary) hypertension: Secondary | ICD-10-CM

## 2018-08-02 DIAGNOSIS — W57XXXA Bitten or stung by nonvenomous insect and other nonvenomous arthropods, initial encounter: Secondary | ICD-10-CM

## 2018-08-02 MED ORDER — TAMSULOSIN HCL 0.4 MG PO CAPS: 0.4000 mg | ORAL_CAPSULE | Freq: Two times a day (BID) | ORAL | 1 refills | Status: DC

## 2018-08-02 MED ORDER — LOSARTAN POTASSIUM 100 MG PO TABS: 100.0000 mg | ORAL_TABLET | Freq: Every day | ORAL | 1 refills | Status: DC

## 2018-08-02 MED ORDER — TRIAMCINOLONE ACETONIDE 0.1 % EX CREA: 1.0000 "application " | TOPICAL_CREAM | Freq: Two times a day (BID) | CUTANEOUS | 2 refills | Status: DC

## 2018-08-02 MED ORDER — LEVOTHYROXINE SODIUM 175 MCG PO TABS: 175.0000 ug | ORAL_TABLET | Freq: Every day | ORAL | 1 refills | Status: DC

## 2018-08-02 MED ORDER — FINASTERIDE 5 MG PO TABS: 5.0000 mg | ORAL_TABLET | Freq: Every day | ORAL | 1 refills | Status: DC

## 2018-08-02 MED ORDER — AMLODIPINE BESYLATE 10 MG PO TABS: 10.0000 mg | ORAL_TABLET | Freq: Every day | ORAL | 1 refills | Status: DC

## 2018-08-02 NOTE — Assessment & Plan Note (Signed)
Stable PreDM previously Concern with obesity, HTN, HLD  Plan: 1. Not on any therapy currently 2. Encourage improved lifestyle - low carb, low sugar diet, switch to diet, reduce portion size, continue improving regular exercise 3. Follow-up 3 months

## 2018-08-02 NOTE — Patient Instructions (Addendum)
AVS info given by phone. No MyChart Access.

## 2018-08-02 NOTE — Progress Notes (Signed)
Virtual Visit via Telephone The purpose of this virtual visit is to provide medical care while limiting exposure to the novel coronavirus (COVID19) for both patient and office staff.  Consent was obtained for phone visit:  Yes.   Answered questions that patient had about telehealth interaction:  Yes.   I discussed the limitations, risks, security and privacy concerns of performing an evaluation and management service by telephone. I also discussed with the patient that there may be a patient responsible charge related to this service. The patient expressed understanding and agreed to proceed.  Patient Location: Home Provider Location: [facility])  ---------------------------------------------------------------------- Chief Complaint  Patient presents with  . Hypertension    S: Reviewed CMA documentation. I have called patient and gathered additional HPI as follows:   OSA - unable to afford the initial sleep  HTN feeling good  Pre-Diabetes Last A1c improved to 5.6 (04/2018) Not checking CBG Meds:not taking meds Currentlyon [facility] Lifestyle: - Diet (low carb, low sugar, improving)  - Exercise (limited due to knee pain) Denies hypoglycemia  CHRONIC HTN: Last visit 04/2018. He has still had some elevated SBP readings 150-160 at times, otherwise has improved. He has felt better without any reported symptoms or concerns. He has not checked BP at home regularly Current Meds -Amlodipine 10mg daily, Losartan 100mg  daily - Past meds include HCTZ stopped due to ED, Carvedilol - stopped coreg due to too low BP in 2016 was on for pre-op shoulder surgery Reports good compliance, took meds today. Tolerating well, w/o complaints.  Admits some life stressors still present Denies CP, dyspnea, HA, edema, dizziness / lightheadedness  BPH On Finasteride and Tamsulosin No new concerns. No new urinary complaints. Has seen urologist, not followed up yet since 2019 Due for  refills  Hypothyroidism Chronic problem, previous TSH normal. Continues Levothyroxine 151mcg daily  Suspected Sleep Apnea / Poor Sleep See last note discussing this issue 04/2018. Sleep study was ordered, but he was unable to afford sleep study. He will pursue this in future when able.  Also needs refill on cream used for bug bites / eczema spots.  Denies any high risk travel to areas of current concern for COVID19. Denies any known or suspected exposure to person with or possibly with COVID19.  Denies any fevers, chills, sweats, body ache, cough, shortness of breath, sinus pain or pressure, headache, abdominal pain, diarrhea  Past Medical History:  Diagnosis Date  . BPH (benign prostatic hyperplasia)   . Hypothyroidism    Social History   Tobacco Use  . Smoking status: Current Every Day Smoker    Packs/day: 1.00    Years: 15.00    Pack years: 15.00  . Smokeless tobacco: Current User  Substance Use Topics  . Alcohol use: Yes    Comment: occ  . Drug use: No    Current Outpatient Medications:  .  amLODipine (NORVASC) 10 MG tablet, Take 1 tablet (10 mg total) by mouth daily., Disp: 90 tablet, Rfl: 1 .  finasteride (PROSCAR) 5 MG tablet, Take 1 tablet (5 mg total) by mouth daily., Disp: 90 tablet, Rfl: 1 .  fluticasone (FLONASE) 50 MCG/ACT nasal spray, Place 2 sprays into both nostrils daily. Use for 4-6 weeks then stop and use seasonally or as needed., Disp: 16 g, Rfl: 3 .  levothyroxine (SYNTHROID) 175 MCG tablet, Take 1 tablet (175 mcg total) by mouth daily before breakfast., Disp: 90 tablet, Rfl: 1 .  losartan (COZAAR) 100 MG tablet, Take 1 tablet (100 mg  total) by mouth daily., Disp: 90 tablet, Rfl: 1 .  naproxen (NAPROSYN) 500 MG tablet, Take 500 mg by mouth 2 (two) times daily with a meal., Disp: , Rfl:  .  tamsulosin (FLOMAX) 0.4 MG CAPS capsule, Take 1 capsule (0.4 mg total) by mouth 2 (two) times daily., Disp: 180 capsule, Rfl: 1 .  triamcinolone cream (KENALOG) 0.1 %,  Apply 1 application topically 2 (two) times daily. As needed for bug bites, Disp: 30 g, Rfl: 2  Depression screen [facility] 2/9 08/02/2018 10/10/2017 04/19/2017  Decreased Interest 0 0 0  Down, Depressed, Hopeless 0 0 0  PHQ - 2 Score 0 0 0    No flowsheet data found.  -------------------------------------------------------------------------- O: No physical exam performed due to remote telephone encounter.  Lab results reviewed.  No results found for this or any previous visit (from the past 2160 hour(s)).  -------------------------------------------------------------------------- A&P:  Problem List Items Addressed This Visit    BPH (benign prostatic hyperplasia)    Chronic BPH with some persistent refractory symptoms LUTS Followed by [facility] - On Finasteride 5mg , Tamsulosin 0.4mg  BID - No known personal/family history of prostate CA      Relevant Medications   finasteride (PROSCAR) 5 MG tablet   tamsulosin (FLOMAX) 0.4 MG CAPS capsule   Essential hypertension - Primary    Elevated BP previously, now asymptomatic. Limited readings - Home BP readings not available - not checking No known complications Prior meds HCTZ 25mg  (declines due to ED), Carvedilol     Plan:  1. CONTINUE Losartan 100mg  daily, Amlodipine 10mg  2. Encourage improved lifestyle - low sodium diet, regular exercise 3. AGAIN Need to start weekly monitor BP outside office, bring readings to next visit, if persistently >140/90 or new symptoms notify office sooner 4. Follow-up within 6 months for HTN check - if BP remains elevated >150 will consider add additional agent, possible BB again - or possibly spiro vs hydralazine vs BB - CONSIDER Referral to Cardiology again - agree to defer once more      Relevant Medications   amLODipine (NORVASC) 10 MG tablet   losartan (COZAAR) 100 MG tablet   Hypothyroidism    Stable, currently controlled Last TSH normal Continue refill levothyroxine 167mcg daily      Relevant  Medications   levothyroxine (SYNTHROID) 175 MCG tablet   Pre-diabetes    Stable PreDM previously Concern with obesity, HTN, HLD  Plan: 1. Not on any therapy currently 2. Encourage improved lifestyle - low carb, low sugar diet, switch to diet, reduce portion size, continue improving regular exercise 3. Follow-up 3 months       Other Visit Diagnoses    Bug bite, initial encounter       Relevant Medications   triamcinolone cream (KENALOG) 0.1 %      Meds ordered this encounter  Medications  . amLODipine (NORVASC) 10 MG tablet    Sig: Take 1 tablet (10 mg total) by mouth daily.    Dispense:  90 tablet    Refill:  1    DX Code Needed = I10  . losartan (COZAAR) 100 MG tablet    Sig: Take 1 tablet (100 mg total) by mouth daily.    Dispense:  90 tablet    Refill:  1  . finasteride (PROSCAR) 5 MG tablet    Sig: Take 1 tablet (5 mg total) by mouth daily.    Dispense:  90 tablet    Refill:  1  . tamsulosin (FLOMAX) 0.4 MG CAPS capsule  Sig: Take 1 capsule (0.4 mg total) by mouth 2 (two) times daily.    Dispense:  180 capsule    Refill:  1  . levothyroxine (SYNTHROID) 175 MCG tablet    Sig: Take 1 tablet (175 mcg total) by mouth daily before breakfast.    Dispense:  90 tablet    Refill:  1  . triamcinolone cream (KENALOG) 0.1 %    Sig: Apply 1 application topically 2 (two) times daily. As needed for bug bites    Dispense:  30 g    Refill:  2    Follow-up: - Return in 6 months for HTN, PreDM A1c  Patient verbalizes understanding with the above medical recommendations including the limitation of remote medical advice.  Specific follow-up and call-back criteria were given for patient to follow-up or seek medical care more urgently if needed.   - Time spent in direct consultation with patient on phone: 8 minutes   [name], [facility] 08/02/2018, 8:11 AM

## 2018-08-02 NOTE — Assessment & Plan Note (Signed)
Chronic BPH with some persistent refractory symptoms LUTS Followed by [facility] - On Finasteride 5mg , Tamsulosin 0.4mg  BID - No known personal/family history of prostate CA

## 2018-08-02 NOTE — Assessment & Plan Note (Signed)
Elevated BP previously, now asymptomatic. Limited readings - Home BP readings not available - not checking No known complications Prior meds HCTZ 25mg  (declines due to ED), Carvedilol     Plan:  1. CONTINUE Losartan 100mg  daily, Amlodipine 10mg  2. Encourage improved lifestyle - low sodium diet, regular exercise 3. AGAIN Need to start weekly monitor BP outside office, bring readings to next visit, if persistently >140/90 or new symptoms notify office sooner 4. Follow-up within 6 months for HTN check - if BP remains elevated >150 will consider add additional agent, possible BB again - or possibly spiro vs hydralazine vs BB - CONSIDER Referral to Cardiology again - agree to defer once more

## 2018-08-02 NOTE — Assessment & Plan Note (Signed)
Stable, currently controlled Last TSH normal Continue refill levothyroxine 136mcg daily

## 2018-09-17 ENCOUNTER — Other Ambulatory Visit: Payer: Self-pay | Admitting: Family Medicine

## 2018-09-17 DIAGNOSIS — I10 Essential (primary) hypertension: Secondary | ICD-10-CM

## 2018-10-16 ENCOUNTER — Other Ambulatory Visit: Payer: Self-pay | Admitting: Family Medicine

## 2018-10-16 DIAGNOSIS — W57XXXA Bitten or stung by nonvenomous insect and other nonvenomous arthropods, initial encounter: Secondary | ICD-10-CM

## 2018-12-04 ENCOUNTER — Other Ambulatory Visit: Payer: Self-pay | Admitting: Family Medicine

## 2018-12-04 DIAGNOSIS — I10 Essential (primary) hypertension: Secondary | ICD-10-CM

## 2019-01-06 ENCOUNTER — Other Ambulatory Visit: Payer: Self-pay | Admitting: Family Medicine

## 2019-01-06 DIAGNOSIS — N401 Enlarged prostate with lower urinary tract symptoms: Secondary | ICD-10-CM

## 2019-01-06 DIAGNOSIS — R3914 Feeling of incomplete bladder emptying: Secondary | ICD-10-CM

## 2019-02-04 ENCOUNTER — Ambulatory Visit: Payer: BLUE CROSS/BLUE SHIELD | Admitting: Family Medicine

## 2019-02-05 ENCOUNTER — Other Ambulatory Visit: Payer: Self-pay

## 2019-02-05 DIAGNOSIS — Z20822 Contact with and (suspected) exposure to covid-19: Secondary | ICD-10-CM

## 2019-02-07 ENCOUNTER — Telehealth: Payer: Self-pay | Admitting: General Practice

## 2019-02-07 LAB — NOVEL CORONAVIRUS, NAA: SARS-CoV-2, NAA: NOT DETECTED

## 2019-02-07 NOTE — Telephone Encounter (Signed)
Negative COVID results given. Patient results "NOT Detected." Caller expressed understanding. ° °

## 2019-04-15 ENCOUNTER — Other Ambulatory Visit: Payer: Self-pay | Admitting: Family Medicine

## 2019-04-15 DIAGNOSIS — I10 Essential (primary) hypertension: Secondary | ICD-10-CM

## 2019-06-19 ENCOUNTER — Other Ambulatory Visit: Payer: Self-pay | Admitting: Family Medicine

## 2019-06-19 DIAGNOSIS — E039 Hypothyroidism, unspecified: Secondary | ICD-10-CM

## 2019-06-19 NOTE — Telephone Encounter (Signed)
Requested medications are due for refill today?  Yes  Requested medications are on active medication list?  Yes  Last Refill:   08/02/2018  # 90 with 1 refill   Future visit scheduled?  No  Notes to Clinic:  Medication failed RX refill protocol due to labs not within 360 days.  Last TSH performed on 04/25/2018.

## 2019-07-14 ENCOUNTER — Other Ambulatory Visit: Payer: Self-pay | Admitting: Family Medicine

## 2019-07-14 DIAGNOSIS — N401 Enlarged prostate with lower urinary tract symptoms: Secondary | ICD-10-CM

## 2019-07-14 DIAGNOSIS — R3914 Feeling of incomplete bladder emptying: Secondary | ICD-10-CM

## 2019-07-14 NOTE — Telephone Encounter (Signed)
Pt needs to call office to make f/u appt-30 day courtesy refill Requested Prescriptions  Pending Prescriptions Disp Refills  . tamsulosin (FLOMAX) 0.4 MG CAPS capsule [Pharmacy Med Name: TAMSULOSIN HCL 0.4 MG CAPSULE] 60 capsule 0    Sig: TAKE 1 CAPSULE (0.4 MG TOTAL) BY MOUTH 2 (TWO) TIMES DAILY.     Urology: Alpha-Adrenergic Blocker Failed - 07/14/2019  9:38 AM      Failed - Last BP in normal range    BP Readings from Last 1 Encounters:  04/30/18 (!) 160/70         Failed - Valid encounter within last 12 months    Recent Outpatient Visits          11 months ago Essential hypertension   [place], Devonne Doughty, DO   1 year ago Annual physical exam   [facility] Olin Hauser, DO   1 year ago Essential hypertension   [facility] Olin Hauser, DO   1 year ago Essential hypertension   [facility] Olin Hauser, DO   2 years ago Annual physical exam   [place], Devonne Doughty, DO

## 2019-07-28 ENCOUNTER — Other Ambulatory Visit: Payer: Self-pay | Admitting: Family Medicine

## 2019-07-28 DIAGNOSIS — N401 Enlarged prostate with lower urinary tract symptoms: Secondary | ICD-10-CM

## 2019-07-28 NOTE — Telephone Encounter (Signed)
Requested medication (s) are due for refill today: 05/14/19  Requested medication (s) are on the active medication list: yes  Last refill:  07/14/19 courtesy fill  Future visit scheduled: no  Notes to clinic:  called pt and LM on VM to call office to make OV   Requested Prescriptions  Pending Prescriptions Disp Refills   tamsulosin (FLOMAX) 0.4 MG CAPS capsule [Pharmacy Med Name: TAMSULOSIN HCL 0.4 MG CAPSULE] 180 capsule 1    Sig: TAKE 1 CAPSULE (0.4 MG TOTAL) BY MOUTH 2 (TWO) TIMES DAILY.      Urology: Alpha-Adrenergic Blocker Failed - 07/28/2019  1:30 PM      Failed - Last BP in normal range    BP Readings from Last 1 Encounters:  04/30/18 (!) 160/70          Failed - Valid encounter within last 12 months    Recent Outpatient Visits           12 months ago Essential hypertension   [place], Devonne Doughty, DO   1 year ago Annual physical exam   [facility] Olin Hauser, DO   1 year ago Essential hypertension   [facility] Olin Hauser, DO   1 year ago Essential hypertension   [facility] Olin Hauser, DO   2 years ago Annual physical exam   [place], Devonne Doughty, DO

## 2019-07-30 ENCOUNTER — Other Ambulatory Visit: Payer: Self-pay | Admitting: Family Medicine

## 2019-07-30 ENCOUNTER — Ambulatory Visit (INDEPENDENT_AMBULATORY_CARE_PROVIDER_SITE_OTHER): Payer: 59 | Admitting: Family Medicine

## 2019-07-30 ENCOUNTER — Other Ambulatory Visit: Payer: Self-pay

## 2019-07-30 ENCOUNTER — Encounter: Payer: Self-pay | Admitting: Family Medicine

## 2019-07-30 VITALS — BP 143/66 | HR 58 | Temp 97.3°F | Resp 16 | Ht 67.0 in | Wt 211.6 lb

## 2019-07-30 DIAGNOSIS — J3089 Other allergic rhinitis: Secondary | ICD-10-CM

## 2019-07-30 DIAGNOSIS — K219 Gastro-esophageal reflux disease without esophagitis: Secondary | ICD-10-CM | POA: Insufficient documentation

## 2019-07-30 DIAGNOSIS — E669 Obesity, unspecified: Secondary | ICD-10-CM

## 2019-07-30 DIAGNOSIS — N401 Enlarged prostate with lower urinary tract symptoms: Secondary | ICD-10-CM | POA: Diagnosis not present

## 2019-07-30 DIAGNOSIS — I10 Essential (primary) hypertension: Secondary | ICD-10-CM

## 2019-07-30 DIAGNOSIS — E039 Hypothyroidism, unspecified: Secondary | ICD-10-CM | POA: Diagnosis not present

## 2019-07-30 DIAGNOSIS — L309 Dermatitis, unspecified: Secondary | ICD-10-CM

## 2019-07-30 DIAGNOSIS — J310 Chronic rhinitis: Secondary | ICD-10-CM

## 2019-07-30 DIAGNOSIS — J329 Chronic sinusitis, unspecified: Secondary | ICD-10-CM

## 2019-07-30 DIAGNOSIS — Z0000 Encounter for general adult medical examination without abnormal findings: Secondary | ICD-10-CM

## 2019-07-30 DIAGNOSIS — R7303 Prediabetes: Secondary | ICD-10-CM

## 2019-07-30 DIAGNOSIS — R3914 Feeling of incomplete bladder emptying: Secondary | ICD-10-CM

## 2019-07-30 DIAGNOSIS — R3911 Hesitancy of micturition: Secondary | ICD-10-CM

## 2019-07-30 DIAGNOSIS — E782 Mixed hyperlipidemia: Secondary | ICD-10-CM

## 2019-07-30 MED ORDER — LOSARTAN POTASSIUM 100 MG PO TABS: 100.0000 mg | ORAL_TABLET | Freq: Every day | ORAL | 1 refills | Status: DC

## 2019-07-30 MED ORDER — TRIAMCINOLONE ACETONIDE 0.1 % EX CREA: 1.0000 "application " | TOPICAL_CREAM | Freq: Two times a day (BID) | CUTANEOUS | 3 refills | Status: DC

## 2019-07-30 MED ORDER — AMLODIPINE BESYLATE 10 MG PO TABS: 10.0000 mg | ORAL_TABLET | Freq: Every day | ORAL | 1 refills | Status: DC

## 2019-07-30 MED ORDER — TAMSULOSIN HCL 0.4 MG PO CAPS: 0.4000 mg | ORAL_CAPSULE | Freq: Two times a day (BID) | ORAL | 1 refills | Status: DC

## 2019-07-30 MED ORDER — FINASTERIDE 5 MG PO TABS: 5.0000 mg | ORAL_TABLET | Freq: Every day | ORAL | 3 refills | Status: DC

## 2019-07-30 MED ORDER — MONTELUKAST SODIUM 10 MG PO TABS: 10.0000 mg | ORAL_TABLET | Freq: Every day | ORAL | 1 refills | Status: DC

## 2019-07-30 MED ORDER — OMEPRAZOLE 40 MG PO CPDR: 40.0000 mg | DELAYED_RELEASE_CAPSULE | Freq: Every day | ORAL | 2 refills | Status: DC

## 2019-07-30 MED ORDER — FLUTICASONE PROPIONATE 50 MCG/ACT NA SUSP: 2.0000 | Freq: Every day | NASAL | 5 refills | Status: AC

## 2019-07-30 NOTE — Progress Notes (Signed)
Subjective:    Patient ID: Todd Burns, male    DOB: September 11, 1960, 59 y.o.   MRN: [id number]  Todd Burns is a 59 y.o. male presenting on 07/30/2019 for Hypertension   HPI   Epigastric / Abdominal Pain Reports new complaint with past 1-2 weeks, epigastric moderate to severe pain, worse after eating. Occasionally eases off. Was drinking more sodas but now on water, has improved overall now. Still has some symptoms Todd Burns dark stool blood in stool, nausea vomiting   Seasonal Environmental Allergies Needs refill Flonase Worse allergies recently Interested in other allergy rx  CHRONIC HTN: Previously better controlled on meds, now off 1 med for period of time needs refills He has not checked BP at home regularly Current Meds -Amlodipine10mg daily, Losartan 100mg  daily (was switched to irbesartan 300mg ) - Past meds include HCTZ stopped due to ED, Carvedilol- stopped coreg due to too low BP in 2016 was on for pre-op shoulder surgery Reports good compliance, took meds today. Tolerating well, w/o complaints. Admits some life stressors still present Denies CP, dyspnea, HA, edema, dizziness / lightheadedness  BPH Previously improved on medicines. Now insurance not covering Finasteride. Has tamsulosin No new concerns. No new urinary complaints. Has seen urologist Due refills  Hypothyroidism Chronic problem, previous TSH normal. Continues Levothyroxine 175mcg daily Overdue for labs, last done in 2020, here for lab today, has enough medicine for now. Refill after lab.    Depression screen Todd Burns 2/9 08/02/2018 10/10/2017 04/19/2017  Decreased Interest 0 0 0  Down, Depressed, Hopeless 0 0 0  PHQ - 2 Score 0 0 0    Social History   Tobacco Use  . Smoking status: Current Every Day Smoker    Packs/day: 1.00    Years: 15.00    Pack years: 15.00  . Smokeless tobacco: Current User  Substance Use Topics  . Alcohol use: Yes    Comment: occ  . Drug use: No    Review of  Systems Per HPI unless specifically indicated above     Objective:    BP (!) 143/66   Pulse (!) 58   Temp (!) 97.3 F (36.3 C) (Temporal)   Resp 16   Ht 5\' 7"  (1.702 m)   Wt 211 lb 9.6 oz (96 kg)   SpO2 98%   BMI 33.14 kg/m   Wt Readings from Last 3 Encounters:  07/30/19 211 lb 9.6 oz (96 kg)  04/30/18 218 lb 4.1 oz (99 kg)  04/25/18 218 lb 12.8 oz (99.2 kg)    Physical Exam Vitals and nursing note reviewed.  Constitutional:      General: He is not in acute distress.    Appearance: He is well-developed. He is not diaphoretic.     Comments: Well-appearing, comfortable, cooperative  HENT:     Head: Normocephalic and atraumatic.  Eyes:     General:        Right eye: No discharge.        Left eye: No discharge.     Conjunctiva/sclera: Conjunctivae normal.  Neck:     Thyroid: No thyromegaly.  Cardiovascular:     Rate and Rhythm: Normal rate and regular rhythm.     Heart sounds: Normal heart sounds. No murmur.  Pulmonary:     Effort: Pulmonary effort is normal. No respiratory distress.     Breath sounds: Normal breath sounds. No wheezing or rales.  Musculoskeletal:        General: Normal range of motion.  Cervical back: Normal range of motion and neck supple.  Lymphadenopathy:     Cervical: No cervical adenopathy.  Skin:    General: Skin is warm and dry.     Findings: No erythema or rash.  Neurological:     Mental Status: He is alert and oriented to person, place, and time.  Psychiatric:        Behavior: Behavior normal.     Comments: Well groomed, good eye contact, normal speech and thoughts       Results for orders placed or performed in visit on 02/05/19  Novel Coronavirus, NAA ([facility])   Specimen: Nasopharyngeal(NP) swabs in vial transport medium   NASOPHARYNGE  TESTING  Result Value Ref Range   SARS-CoV-2, NAA Not Detected Not Detected      Assessment & Plan:   Problem List Items Addressed This Visit    Hypothyroidism    Due for lab, check TSH  Free T4 today Continue current levothyroxine 175 - refill or adjust dose based on lab      Relevant Orders   TSH   T4, free   Gastroesophageal reflux disease    Suspected acute flare GERD with recent worsening due to trigger foods and smoking Epigastric abdominal pain, worse after eating, no other red flag symptoms  Plan: 1. Start rx Omeprazole 40mg  daily 30 min prior to 1st meal for 4-6weeks, may need repeat course in future if recurrence 2. Diet modifications reduce GERD 3. May use OTC antacid PRN 4. Follow-up 4-6 weeks as needed, may consider prolonged course, BID, or change to alternative PPI such as protonix       Relevant Medications   omeprazole (PRILOSEC) 40 MG capsule   Essential hypertension    Mild elevated BP not on all meds, needs authorized refills - Home BP readings not available - not checking No known complications Prior meds HCTZ 25mg  (declines due to ED), Carvedilol     Plan:  1. Restart Losartan 100mg  daily, refill Amlodipine 10mg  2. Encourage improved lifestyle - low sodium diet, regular exercise 3. Need to start weekly monitor BP outside office, bring readings to next visit, if persistently >140/90 or new symptoms notify office sooner 4. Follow-up 3 month visit - Future consider other agents for BP if needed      Relevant Medications   losartan (COZAAR) 100 MG tablet   amLODipine (NORVASC) 10 MG tablet   BPH (benign prostatic hyperplasia) - Primary    Chronic BPH with some persistent refractory symptoms LUTS Followed by [facility] - On Finasteride 5mg , Tamsulosin 0.4mg  BID - Was off Finasteride recently due to insurance not covering by his report - send rx to [place] today for 90 day goodrx coupon - No known personal/family history of prostate CA      Relevant Medications   tamsulosin (FLOMAX) 0.4 MG CAPS capsule   finasteride (PROSCAR) 5 MG tablet    Other Visit Diagnoses    Dermatitis       Relevant Medications   triamcinolone cream (KENALOG)  0.1 %   Rhinosinusitis       Likely related to seasonal/allergies, or could be early viral URI, trial on Atrovent short-term, then Flonase longer   Relevant Medications   fluticasone (FLONASE) 50 MCG/ACT nasal spray   Environmental and seasonal allergies       Relevant Medications   montelukast (SINGULAIR) 10 MG tablet      Allergies Worsening, will trial back on Flonase and Add Singulair nightly Can take otc anti histamine loratadine  Meds ordered this encounter  Medications  . triamcinolone cream (KENALOG) 0.1 %    Sig: Apply 1 application topically 2 (two) times daily. As needed for bug bites or itching    Dispense:  30 g    Refill:  3    DX Code Needed L30.9  . tamsulosin (FLOMAX) 0.4 MG CAPS capsule    Sig: Take 1 capsule (0.4 mg total) by mouth 2 (two) times daily.    Dispense:  180 capsule    Refill:  1  . losartan (COZAAR) 100 MG tablet    Sig: Take 1 tablet (100 mg total) by mouth daily.    Dispense:  90 tablet    Refill:  1  . amLODipine (NORVASC) 10 MG tablet    Sig: Take 1 tablet (10 mg total) by mouth daily.    Dispense:  90 tablet    Refill:  1  . fluticasone (FLONASE) 50 MCG/ACT nasal spray    Sig: Place 2 sprays into both nostrils daily. Use for 4-6 weeks then stop and use seasonally or as needed.    Dispense:  16 g    Refill:  5  . montelukast (SINGULAIR) 10 MG tablet    Sig: Take 1 tablet (10 mg total) by mouth at bedtime.    Dispense:  90 tablet    Refill:  1  . finasteride (PROSCAR) 5 MG tablet    Sig: Take 1 tablet (5 mg total) by mouth daily.    Dispense:  90 tablet    Refill:  3  . omeprazole (PRILOSEC) 40 MG capsule    Sig: Take 1 capsule (40 mg total) by mouth daily before breakfast. 4-6 weeks then can taper down off med    Dispense:  30 capsule    Refill:  2      Follow up plan: Return in about 3 months (around 10/30/2019) for Annual Physical.  Future labs ordered for 10/2019   [name], [facility] 07/30/2019, 11:06 AM

## 2019-07-30 NOTE — Patient Instructions (Addendum)
Thank you for coming to the office today.  - Thyroid lab now, stay tuned for result. Can refill med when ready  All other meds good for up to 6 months.  Allergy pill at bedtime  Use [employer] for discount for FInasteride (Proscar)  I am not sure the exact cause of your abdominal pain, however I am concerned that one significant possibility could be uncontrolled Acid Reflux (GERD) and may have developed an Ulcer (Peptic Ulcer of stomach).  Start Omeprazole 40mg  daily before breakfast or first meal for 4-6 weeks then taper down off med Can take OTC antacid if needed  DIET RECOMMENDATIONS - Avoid spicy, greasy, fried foods, also things like caffeine, dark chocolate, peppermint can worsen - Avoid large meals and late night snacks, also do not go more than 4-5 hours without a snack or meal (not eating will worsen reflux symptoms due to stomach acid) - You may also elevate the head of your bed at night to sleep at very slight incline to help reduce symptoms  If the problem improves but keeps coming back, we can discuss higher dose or longer course at next visit.  If symptoms are worsening or persistent despite treatment or develop any different severe esophagus or abdominal pain, unable to swallow solids or liquids, nausea, vomiting especially blood in vomit, fever/chills, or unintentional weight loss / no appetite, please follow-up sooner in office or seek more immediate medical attention at hospital Emergency Department.  Regarding other medicines:  - STOP taking Ibuprofen, Advil, Motrin, Goody's / BC powder - DO NOT take without discussing with your doctor. These medicines can put you at high risk for future bleeding.  If need pain medicine, may take Tylenol Extra Strength (Acetaminophen) 500mg  tabs - take 1 to 2 tabs per dose (max 1000mg ) every 6-8 hours for pain (take regularly, don't skip a dose for next 7 days), max 24 hour daily dose is 6 tablets or 3000mg . In the future you can  repeat the same everyday Tylenol course for 1-2 weeks at a time.   DUE for FASTING BLOOD WORK (no food or drink after midnight before the lab appointment, only water or coffee without cream/sugar on the morning of)  SCHEDULE "Lab Only" visit in the morning at the clinic for lab draw in 3 MONTHS   - Make sure Lab Only appointment is at about 1 week before your next appointment, so that results will be available  For Lab Results, once available within 2-3 days of blood draw, you can can log in to MyChart online to view your results and a brief explanation. Also, we can discuss results at next follow-up visit.   Please schedule a Follow-up Appointment to: Return in about 3 months (around 10/30/2019) for Annual Physical.  If you have any other questions or concerns, please feel free to call the office or send a message through [facility]. You may also schedule an earlier appointment if necessary.  Additionally, you may be receiving a survey about your experience at our office within a few days to 1 week by e-mail or mail. We value your feedback.  [name], DO [name]

## 2019-07-30 NOTE — Assessment & Plan Note (Signed)
Due for lab, check TSH Free T4 today Continue current levothyroxine 175 - refill or adjust dose based on lab

## 2019-07-30 NOTE — Assessment & Plan Note (Signed)
Suspected acute flare GERD with recent worsening due to trigger foods and smoking Epigastric abdominal pain, worse after eating, no other red flag symptoms  Plan: 1. Start rx Omeprazole 40mg  daily 30 min prior to 1st meal for 4-6weeks, may need repeat course in future if recurrence 2. Diet modifications reduce GERD 3. May use OTC antacid PRN 4. Follow-up 4-6 weeks as needed, may consider prolonged course, BID, or change to alternative PPI such as protonix

## 2019-07-30 NOTE — Assessment & Plan Note (Signed)
Mild elevated BP not on all meds, needs authorized refills - Home BP readings not available - not checking No known complications Prior meds HCTZ 25mg  (declines due to ED), Carvedilol     Plan:  1. Restart Losartan 100mg  daily, refill Amlodipine 10mg  2. Encourage improved lifestyle - low sodium diet, regular exercise 3. Need to start weekly monitor BP outside office, bring readings to next visit, if persistently >140/90 or new symptoms notify office sooner 4. Follow-up 3 month visit - Future consider other agents for BP if needed

## 2019-07-30 NOTE — Assessment & Plan Note (Signed)
Chronic BPH with some persistent refractory symptoms LUTS Followed by [facility] - On Finasteride 5mg , Tamsulosin 0.4mg  BID - Was off Finasteride recently due to insurance not covering by his report - send rx to [facility] today for 90 day goodrx coupon - No known personal/family history of prostate CA

## 2019-07-31 ENCOUNTER — Other Ambulatory Visit: Payer: Self-pay | Admitting: Family Medicine

## 2019-07-31 DIAGNOSIS — E039 Hypothyroidism, unspecified: Secondary | ICD-10-CM

## 2019-07-31 LAB — TSH: TSH: 1.04 mIU/L (ref 0.40–4.50)

## 2019-07-31 LAB — T4, FREE: Free T4: 1.5 ng/dL (ref 0.8–1.8)

## 2019-07-31 MED ORDER — LEVOTHYROXINE SODIUM 175 MCG PO TABS: 175.0000 ug | ORAL_TABLET | Freq: Every day | ORAL | 1 refills | Status: DC

## 2019-10-29 ENCOUNTER — Other Ambulatory Visit: Payer: Self-pay

## 2019-10-29 DIAGNOSIS — Z0000 Encounter for general adult medical examination without abnormal findings: Secondary | ICD-10-CM

## 2019-10-29 DIAGNOSIS — R3911 Hesitancy of micturition: Secondary | ICD-10-CM

## 2019-10-29 DIAGNOSIS — N401 Enlarged prostate with lower urinary tract symptoms: Secondary | ICD-10-CM

## 2019-10-29 DIAGNOSIS — E782 Mixed hyperlipidemia: Secondary | ICD-10-CM | POA: Diagnosis not present

## 2019-10-29 DIAGNOSIS — E669 Obesity, unspecified: Secondary | ICD-10-CM

## 2019-10-29 DIAGNOSIS — I10 Essential (primary) hypertension: Secondary | ICD-10-CM

## 2019-10-29 DIAGNOSIS — R7303 Prediabetes: Secondary | ICD-10-CM

## 2019-10-30 LAB — CBC WITH DIFFERENTIAL/PLATELET
Absolute Monocytes: 728 cells/uL (ref 200–950)
Basophils Absolute: 68 cells/uL (ref 0–200)
Basophils Relative: 1 %
Eosinophils Absolute: 177 cells/uL (ref 15–500)
Eosinophils Relative: 2.6 %
HCT: 43.3 % (ref 38.5–50.0)
Hemoglobin: 14.2 g/dL (ref 13.2–17.1)
Lymphs Abs: 2400 cells/uL (ref 850–3900)
MCH: 27.6 pg (ref 27.0–33.0)
MCHC: 32.8 g/dL (ref 32.0–36.0)
MCV: 84.2 fL (ref 80.0–100.0)
MPV: 11.2 fL (ref 7.5–12.5)
Monocytes Relative: 10.7 %
Neutro Abs: 3427 cells/uL (ref 1500–7800)
Neutrophils Relative %: 50.4 %
Platelets: 185 10*3/uL (ref 140–400)
RBC: 5.14 10*6/uL (ref 4.20–5.80)
RDW: 16.4 % — ABNORMAL HIGH (ref 11.0–15.0)
Total Lymphocyte: 35.3 %
WBC: 6.8 10*3/uL (ref 3.8–10.8)

## 2019-10-30 LAB — COMPLETE METABOLIC PANEL WITH GFR
AG Ratio: 1.3 (calc) (ref 1.0–2.5)
ALT: 10 U/L (ref 9–46)
AST: 18 U/L (ref 10–35)
Albumin: 4 g/dL (ref 3.6–5.1)
Alkaline phosphatase (APISO): 58 U/L (ref 35–144)
BUN: 16 mg/dL (ref 7–25)
CO2: 27 mmol/L (ref 20–32)
Calcium: 9.1 mg/dL (ref 8.6–10.3)
Chloride: 105 mmol/L (ref 98–110)
Creat: 1.12 mg/dL (ref 0.70–1.25)
GFR, Est African American: 82 mL/min/{1.73_m2} (ref >=60)
GFR, Est Non African American: 71 mL/min/{1.73_m2} (ref >=60)
Globulin: 3.2 g/dL (calc) (ref 1.9–3.7)
Glucose, Bld: 95 mg/dL (ref 65–99)
Potassium: 4.1 mmol/L (ref 3.5–5.3)
Sodium: 138 mmol/L (ref 135–146)
Total Bilirubin: 0.6 mg/dL (ref 0.2–1.2)
Total Protein: 7.2 g/dL (ref 6.1–8.1)

## 2019-10-30 LAB — HEMOGLOBIN A1C
Hgb A1c MFr Bld: 5.7 % of total Hgb — ABNORMAL HIGH (ref <5.7)
Mean Plasma Glucose: 117 (calc)
eAG (mmol/L): 6.5 (calc)

## 2019-10-30 LAB — LIPID PANEL
Cholesterol: 231 mg/dL — ABNORMAL HIGH (ref <200)
HDL: 47 mg/dL (ref >=40)
LDL Cholesterol (Calc): 161 mg/dL (calc) — ABNORMAL HIGH
Non-HDL Cholesterol (Calc): 184 mg/dL (calc) — ABNORMAL HIGH (ref <130)
Total CHOL/HDL Ratio: 4.9 (calc) (ref <5.0)
Triglycerides: 113 mg/dL (ref <150)

## 2019-10-30 LAB — PSA: PSA: 4 ng/mL (ref <=4.0)

## 2019-11-05 ENCOUNTER — Other Ambulatory Visit: Payer: Self-pay

## 2019-11-05 ENCOUNTER — Encounter: Payer: Self-pay | Admitting: Family Medicine

## 2019-11-05 ENCOUNTER — Ambulatory Visit (INDEPENDENT_AMBULATORY_CARE_PROVIDER_SITE_OTHER): Payer: 59 | Admitting: Family Medicine

## 2019-11-05 ENCOUNTER — Other Ambulatory Visit: Payer: Self-pay | Admitting: Family Medicine

## 2019-11-05 VITALS — BP 148/74 | HR 58 | Temp 97.1°F | Resp 16 | Ht 67.0 in | Wt 214.0 lb

## 2019-11-05 DIAGNOSIS — E782 Mixed hyperlipidemia: Secondary | ICD-10-CM

## 2019-11-05 DIAGNOSIS — N529 Male erectile dysfunction, unspecified: Secondary | ICD-10-CM

## 2019-11-05 DIAGNOSIS — R7303 Prediabetes: Secondary | ICD-10-CM

## 2019-11-05 DIAGNOSIS — N401 Enlarged prostate with lower urinary tract symptoms: Secondary | ICD-10-CM

## 2019-11-05 DIAGNOSIS — R3911 Hesitancy of micturition: Secondary | ICD-10-CM

## 2019-11-05 DIAGNOSIS — E669 Obesity, unspecified: Secondary | ICD-10-CM

## 2019-11-05 DIAGNOSIS — E039 Hypothyroidism, unspecified: Secondary | ICD-10-CM

## 2019-11-05 DIAGNOSIS — I10 Essential (primary) hypertension: Secondary | ICD-10-CM

## 2019-11-05 DIAGNOSIS — R29818 Other symptoms and signs involving the nervous system: Secondary | ICD-10-CM

## 2019-11-05 DIAGNOSIS — Z0000 Encounter for general adult medical examination without abnormal findings: Secondary | ICD-10-CM

## 2019-11-05 DIAGNOSIS — R972 Elevated prostate specific antigen [PSA]: Secondary | ICD-10-CM

## 2019-11-05 MED ORDER — ROSUVASTATIN CALCIUM 10 MG PO TABS: 10.0000 mg | ORAL_TABLET | Freq: Every day | ORAL | 3 refills | Status: DC

## 2019-11-05 MED ORDER — SILDENAFIL CITRATE 20 MG PO TABS: ORAL_TABLET | ORAL | 2 refills | Status: DC

## 2019-11-05 NOTE — Assessment & Plan Note (Signed)
Encourage weight loss lifestyle changes

## 2019-11-05 NOTE — Assessment & Plan Note (Signed)
Stable PreDM A1c 5.9 approx unchanged Concern with obesity, HTN, HLD  Plan: 1. Not on any therapy currently 2. Encourage improved lifestyle - low carb, low sugar diet, switch to diet, reduce portion size, continue improving regular exercise

## 2019-11-05 NOTE — Assessment & Plan Note (Signed)
Mild elevated BP still, repeat manual improved but above goal SBP >140 - Home BP readings still elevated when he checks at home No known complications Prior meds HCTZ 25mg  (declines due to ED), Carvedilol unsure why off, seems had history too low BP   Suspect untreated OSA underlying and or smoking secondary causes of HTN worsening  Plan:  1. Continue current - Losartan 100mg  daily, Amlodipine 10mg  daily 2. Encourage improved lifestyle - low sodium diet, regular exercise 3. Keep weekly monitor BP outside office, bring readings to next visit, if persistently >140/90 or new symptoms notify office sooner  Will defer for now, treating high cholesterol and he will work on lifestyle, and goal to try to reduce smoking / try to get PSG covered

## 2019-11-05 NOTE — Assessment & Plan Note (Signed)
Chronic BPH with some persistent refractory symptoms LUTS Previously Followed by [facility] Dr Bernardo Heater in 2019 OFF Finasteride due to cost/coverage  On Tamsulosin 0.4mg  BID - No known personal/family history of prostate CA  Elevated PSA now to 4 (prior 2.7 to 3.2)  Referral back to [facility] Urology Dr Bernardo Heater for gradual elevated PSA and also for BPH / ED symptoms

## 2019-11-05 NOTE — Progress Notes (Signed)
Subjective:    Patient ID: Todd Burns, male    DOB: [birth year], 60 y.o.   MRN: [id number]  Todd Burns is a 59 y.o. male presenting on 11/05/2019 for Annual Exam   HPI   Here for Annual and Lab Review.  CHRONIC HTN: Suspected OSA Chronic issue poorly managed BP. He checks his BP regularly still elevated Previously was ordered PSG but he could not afford and it was declined and order has now expired Current Meds -Amlodipine10mg daily, Losartan 100mg  daily (was switched to irbesartan 300mg ) - Past meds include HCTZ stopped due to ED, Carvedilol- stopped coreg due to too low BP in 2016 was on for pre-op shoulder surgery Reports good compliance, took meds today. Tolerating well, w/o complaints. Admits some life stressorsstill present Denies CP, dyspnea, HA, edema, dizziness / lightheadedness  Pre-Diabetes: Reports in past high sugar 5.9, now has improved Last lab A1c 5.7 Meds: none Currently on ARB Denies hypoglycemia, polyuria, visual changes, numbness or tingling.  HYPERLIPIDEMIA: Last lipid panel 10/2019, uncontrolled elevated LDL 161, elevated total cholesterol Lifestyle - Diet: not always adhering to low cholesterol diet  BPH Elevated PSA Erectile dysfunction  Previously improved on medicines. Now insurance not covering Finasteride On Tamsulosin Inadequate results Previously seen Dr Bernardo Heater 2019 at [facility] Lab now shows increasing PSA trend to 4.0, previously 2.7 to 3.2 No fam history prostate CA Has worsening erectile dysfunction, asking about starting med  Hypothyroidism Chronic problem, previous TSH normal. Continues Levothyroxine 141mcg daily   Epworth Sleepiness Scale Total Score: 12 Sitting and reading - 1 Watching TV - 2 Sitting inactive in a public place - 2 As a passenger in a car for an hour without a break - 2 Lying down to rest in the afternoon when circumstances permit - 3 Sitting and talking to someone - 0 Sitting quietly after a lunch  without alcohol - 2 In a car, while stopped for a few minutes in traffic - 0  STOP-Bang OSA scoring Snoring yes   Tiredness yes   Observed apneas yes   Pressure HTN yes   BMI > 35 kg/m2 no   Age > 50  yes   Neck (male >17 in; Male >16 in)  yes 21"  Gender male yes   OSA risk low (0-2)  OSA risk intermediate (3-4)  OSA risk high (5+)  Total: 7 High Risk     Health Maintenance: Due for Flu Shot, declines today despite counseling on benefits  UTD COVID vaccine  UTD Colonoscopy 08/2014   Depression screen [facility] 2/9 11/05/2019 08/02/2018 10/10/2017  Decreased Interest 0 0 0  Down, Depressed, Hopeless 0 0 0  PHQ - 2 Score 0 0 0    Past Medical History:  Diagnosis Date  . BPH (benign prostatic hyperplasia)   . Hypothyroidism    Past Surgical History:  Procedure Laterality Date  . COLONOSCOPY N/A 08/08/2014   Procedure: COLONOSCOPY;  Surgeon: Lucilla Lame, MD;  Location: [facility];  Service: Gastroenterology;  Laterality: N/A;  . MEDIAL COLLATERAL LIGAMENT REPAIR, KNEE  1999  . SHOULDER ARTHROSCOPY WITH ROTATOR CUFF REPAIR Right 10/01/2014   Procedure: SHOULDER ARTHROSCOPY WITH ROTATOR CUFF REPAIR;  Surgeon: Earnestine Leys, MD;  Location: [facility];  Service: Orthopedics;  Laterality: Right;  Mini open rotator cuff repair Arthroscopy  Distal clavicle excision Subacromial decompression   Social History   Socioeconomic History  . Marital status: Married    Spouse name: Not on file  . Number of children: Not on  file  . Years of education: Not on file  . Highest education level: Not on file  Occupational History  . Occupation: Radiographer, therapeutic  Tobacco Use  . Smoking status: Current Every Day Smoker    Packs/day: 1.00    Years: 15.00    Pack years: 15.00  . Smokeless tobacco: Current User  Vaping Use  . Vaping Use: Some days  Substance and Sexual Activity  . Alcohol use: Yes    Comment: occ  . Drug use: No  . Sexual activity: Yes  Other Topics Concern   . Not on file  Social History Narrative  . Not on file   Social Determinants of Health   Financial Resource Strain:   . Difficulty of Paying Living Expenses: Not on file  Food Insecurity:   . Worried About Charity fundraiser in the Last Year: Not on file  . Ran Out of Food in the Last Year: Not on file  Transportation Needs:   . Lack of Transportation (Medical): Not on file  . Lack of Transportation (Non-Medical): Not on file  Physical Activity:   . Days of Exercise per Week: Not on file  . Minutes of Exercise per Session: Not on file  Stress:   . Feeling of Stress : Not on file  Social Connections:   . Frequency of Communication with Friends and Family: Not on file  . Frequency of Social Gatherings with Friends and Family: Not on file  . Attends Religious Services: Not on file  . Active Member of Clubs or Organizations: Not on file  . Attends Archivist Meetings: Not on file  . Marital Status: Not on file  Intimate Partner Violence:   . Fear of Current or Ex-Partner: Not on file  . Emotionally Abused: Not on file  . Physically Abused: Not on file  . Sexually Abused: Not on file   Family History  Problem Relation Age of Onset  . Cancer Mother        thyroid  . Cancer Father        lung  . Thyroid disease Brother   . Cancer Maternal Aunt        thyroid  . Cancer Sister   . Thyroid disease Brother   . Cancer Sister      Current Outpatient Medications on File Prior to Visit  Medication Sig  . amLODipine (NORVASC) 10 MG tablet Take 1 tablet (10 mg total) by mouth daily.  . finasteride (PROSCAR) 5 MG tablet Take 1 tablet (5 mg total) by mouth daily.  . fluticasone (FLONASE) 50 MCG/ACT nasal spray Place 2 sprays into both nostrils daily. Use for 4-6 weeks then stop and use seasonally or as needed.  Marland Kitchen levothyroxine (SYNTHROID) 175 MCG tablet Take 1 tablet (175 mcg total) by mouth daily before breakfast.  . losartan (COZAAR) 100 MG tablet Take 1 tablet (100  mg total) by mouth daily.  . montelukast (SINGULAIR) 10 MG tablet Take 1 tablet (10 mg total) by mouth at bedtime.  . naproxen (NAPROSYN) 500 MG tablet Take 500 mg by mouth 2 (two) times daily with a meal. As needed  . omeprazole (PRILOSEC) 40 MG capsule Take 1 capsule (40 mg total) by mouth daily before breakfast. 4-6 weeks then can taper down off med  . tamsulosin (FLOMAX) 0.4 MG CAPS capsule Take 1 capsule (0.4 mg total) by mouth 2 (two) times daily.  Marland Kitchen triamcinolone cream (KENALOG) 0.1 % Apply 1 application topically 2 (two) times  daily. As needed for bug bites or itching   No current facility-administered medications on file prior to visit.    Review of Systems  Constitutional: Negative for activity change, appetite change, chills, diaphoresis, fatigue and fever.  HENT: Negative for congestion and hearing loss.   Eyes: Negative for visual disturbance.  Respiratory: Positive for apnea. Negative for cough, chest tightness, shortness of breath and wheezing.   Cardiovascular: Negative for chest pain, palpitations and leg swelling.  Gastrointestinal: Negative for abdominal pain, anal bleeding, blood in stool, constipation, diarrhea, nausea and vomiting.  Endocrine: Negative for cold intolerance.  Genitourinary: Negative for difficulty urinating, dysuria, frequency and hematuria.  Musculoskeletal: Negative for arthralgias and neck pain.  Skin: Negative for rash.  Allergic/Immunologic: Negative for environmental allergies.  Neurological: Negative for dizziness, weakness, light-headedness, numbness and headaches.  Hematological: Negative for adenopathy.  Psychiatric/Behavioral: Negative for behavioral problems, dysphoric mood and sleep disturbance.   Per HPI unless specifically indicated above      Objective:    BP (!) 148/74 (BP Location: Left Arm, Cuff Size: Normal)   Pulse (!) 58   Temp (!) 97.1 F (36.2 C) (Temporal)   Resp 16   Ht 5\' 7"  (1.702 m)   Wt 214 lb (97.1 kg)   SpO2  99%   BMI 33.52 kg/m   Wt Readings from Last 3 Encounters:  11/05/19 214 lb (97.1 kg)  07/30/19 211 lb 9.6 oz (96 kg)  04/30/18 218 lb 4.1 oz (99 kg)    Physical Exam Vitals and nursing note reviewed.  Constitutional:      General: He is not in acute distress.    Appearance: He is well-developed. He is not diaphoretic.     Comments: Well-appearing, comfortable, cooperative  HENT:     Head: Normocephalic and atraumatic.  Eyes:     General:        Right eye: No discharge.        Left eye: No discharge.     Conjunctiva/sclera: Conjunctivae normal.     Pupils: Pupils are equal, round, and reactive to light.  Neck:     Thyroid: No thyromegaly.     Vascular: No carotid bruit.  Cardiovascular:     Rate and Rhythm: Normal rate and regular rhythm.     Heart sounds: Normal heart sounds. No murmur heard.   Pulmonary:     Effort: Pulmonary effort is normal. No respiratory distress.     Breath sounds: Normal breath sounds. No wheezing or rales.  Abdominal:     General: Bowel sounds are normal. There is no distension.     Palpations: Abdomen is soft. There is no mass.     Tenderness: There is no abdominal tenderness.  Musculoskeletal:        General: No tenderness. Normal range of motion.     Cervical back: Normal range of motion and neck supple.     Right lower leg: No edema.     Left lower leg: No edema.     Comments: Upper / Lower Extremities: - Normal muscle tone, strength bilateral upper extremities 5/5, lower extremities 5/5  Lymphadenopathy:     Cervical: No cervical adenopathy.  Skin:    General: Skin is warm and dry.     Findings: No erythema or rash.  Neurological:     Mental Status: He is alert and oriented to person, place, and time.     Comments: Distal sensation intact to light touch all extremities  Psychiatric:  Behavior: Behavior normal.     Comments: Well groomed, good eye contact, normal speech and thoughts       Results for orders placed or  performed in visit on 10/29/19  PSA  Result Value Ref Range   PSA 4.0 < OR = 4.0 ng/mL  Lipid panel  Result Value Ref Range   Cholesterol 231 (H) <200 mg/dL   HDL 47 > OR = 40 mg/dL   Triglycerides 113 <150 mg/dL   LDL Cholesterol (Calc) 161 (H) mg/dL (calc)   Total CHOL/HDL Ratio 4.9 <5.0 (calc)   Non-HDL Cholesterol (Calc) 184 (H) <130 mg/dL (calc)  COMPLETE METABOLIC PANEL WITH GFR  Result Value Ref Range   Glucose, Bld 95 65 - 99 mg/dL   BUN 16 7 - 25 mg/dL   Creat 1.12 0.70 - 1.25 mg/dL   GFR, Est Non African American 71 > OR = 60 mL/min/1.23m2   GFR, Est African American 82 > OR = 60 mL/min/1.67m2   BUN/Creatinine Ratio NOT APPLICABLE 6 - 22 (calc)   Sodium 138 135 - 146 mmol/L   Potassium 4.1 3.5 - 5.3 mmol/L   Chloride 105 98 - 110 mmol/L   CO2 27 20 - 32 mmol/L   Calcium 9.1 8.6 - 10.3 mg/dL   Total Protein 7.2 6.1 - 8.1 g/dL   Albumin 4.0 3.6 - 5.1 g/dL   Globulin 3.2 1.9 - 3.7 g/dL (calc)   AG Ratio 1.3 1.0 - 2.5 (calc)   Total Bilirubin 0.6 0.2 - 1.2 mg/dL   Alkaline phosphatase (APISO) 58 35 - 144 U/L   AST 18 10 - 35 U/L   ALT 10 9 - 46 U/L  CBC with Differential/Platelet  Result Value Ref Range   WBC 6.8 3.8 - 10.8 Thousand/uL   RBC 5.14 4.20 - 5.80 Million/uL   Hemoglobin 14.2 13.2 - 17.1 g/dL   HCT 43.3 38 - 50 %   MCV 84.2 80.0 - 100.0 fL   MCH 27.6 27.0 - 33.0 pg   MCHC 32.8 32.0 - 36.0 g/dL   RDW 16.4 (H) 11.0 - 15.0 %   Platelets 185 140 - 400 Thousand/uL   MPV 11.2 7.5 - 12.5 fL   Neutro Abs 3,427 1,500 - 7,800 cells/uL   Lymphs Abs 2,400 850 - 3,900 cells/uL   Absolute Monocytes 728 200 - 950 cells/uL   Eosinophils Absolute 177 15 - 500 cells/uL   Basophils Absolute 68 0 - 200 cells/uL   Neutrophils Relative % 50.4 %   Total Lymphocyte 35.3 %   Monocytes Relative 10.7 %   Eosinophils Relative 2.6 %   Basophils Relative 1.0 %  Hemoglobin A1c  Result Value Ref Range   Hgb A1c MFr Bld 5.7 (H) <5.7 % of total Hgb   Mean Plasma Glucose 117  (calc)   eAG (mmol/L) 6.5 (calc)      Assessment & Plan:   Problem List Items Addressed This Visit    Suspected sleep apnea    Persistent clinical concern for suspected obstructive sleep apnea given reported symptoms with witnessed apnea, snoring and sleep disturbance, fatigue excessive sleepiness. - Screening: ESS score 12 / STOP-Bang Score 7 - Neck Circumference: 18" - Co-morbidities: HTN  Plan: 1. Discussion on initial diagnosis and testing for OSA, risk factors, management, complications 2. Agree to proceed with sleep study testing based on clinical concerns  When patient is financially ready we can send referral back to [facility] - he should let us know  first      Pre-diabetes    Stable PreDM A1c 5.9 approx unchanged Concern with obesity, HTN, HLD  Plan: 1. Not on any therapy currently 2. Encourage improved lifestyle - low carb, low sugar diet, switch to diet, reduce portion size, continue improving regular exercise      Obesity (BMI 30.0-34.9)    Encourage weight loss lifestyle changes      Mixed hyperlipidemia    Uncontrolled cholesterol LDL 161 Last lipid panel 10/2019 The 10-year ASCVD risk score Mikey Bussing DC Jr., et al., 2013) is: 29.6%  Plan: 1. Counseling on ASCVD risk / hyperlipidemia - START Rosuvastatin 10mg  nightly, counseling on benefit risk side effect, dose titration 2. Encourage improved lifestyle - low carb/cholesterol, reduce portion size, continue improving regular exercise  Follow up labs fasting lipid CMET in 3 months to monitor progress on Statin      Relevant Medications   rosuvastatin (CRESTOR) 10 MG tablet   sildenafil (REVATIO) 20 MG tablet   Hypothyroidism    Last lab 07/2019 was reviewed Now we can repeat lab Thyroid panel in 01/2020 Continue current Levothyroxine 148mcg daily      Essential hypertension    Mild elevated BP still, repeat manual improved but above goal SBP >140 - Home BP readings still elevated when he  checks at home No known complications Prior meds HCTZ 25mg  (declines due to ED), Carvedilol unsure why off, seems had history too low BP   Suspect untreated OSA underlying and or smoking secondary causes of HTN worsening  Plan:  1. Continue current - Losartan 100mg  daily, Amlodipine 10mg  daily 2. Encourage improved lifestyle - low sodium diet, regular exercise 3. Keep weekly monitor BP outside office, bring readings to next visit, if persistently >140/90 or new symptoms notify office sooner  Will defer for now, treating high cholesterol and he will work on lifestyle, and goal to try to reduce smoking / try to get PSG covered      Relevant Medications   rosuvastatin (CRESTOR) 10 MG tablet   sildenafil (REVATIO) 20 MG tablet   BPH (benign prostatic hyperplasia) - Primary    Chronic BPH with some persistent refractory symptoms LUTS Previously Followed by [facility] Dr Bernardo Heater in 2019 OFF Finasteride due to cost/coverage  On Tamsulosin 0.4mg  BID - No known personal/family history of prostate CA  Elevated PSA now to 4 (prior 2.7 to 3.2)  Referral back to [facility] Urology Dr Bernardo Heater for gradual elevated PSA and also for BPH / ED symptoms      Relevant Orders   Ambulatory referral to Urology    Other Visit Diagnoses    Elevated PSA, less than 10 ng/ml       Relevant Orders   Ambulatory referral to Urology   Annual physical exam       Erectile dysfunction, unspecified erectile dysfunction type       Relevant Medications   sildenafil (REVATIO) 20 MG tablet      Updated Health Maintenance information Reviewed recent lab results with patient Encouraged improvement to lifestyle with diet and exercise - Goal of weight loss  Orders Placed This Encounter  Procedures  . Ambulatory referral to Urology    Referral Priority:   Routine    Referral Type:   Consultation    Referral Reason:   Specialty Services Required    Requested Specialty:   Urology    Number of Visits Requested:   1      Meds ordered this encounter  Medications  .  rosuvastatin (CRESTOR) 10 MG tablet    Sig: Take 1 tablet (10 mg total) by mouth at bedtime.    Dispense:  90 tablet    Refill:  3  . sildenafil (REVATIO) 20 MG tablet    Sig: Take 1-5 pills about 30 min prior to sex. Start with 1 and increase as needed.    Dispense:  30 tablet    Refill:  2    Use goodrx      Follow up plan: Return in about 3 months (around 02/04/2020) for 3 month fasting lab only then 1 week later Virtual Telephone Visit Cholesterol lab result.   Future labs CMET Lipid 3 month - 01/28/20 - Follow-up statin, CMET Lipid, TSH Free T4  [name], DO [facility] 11/05/2019, 8:39 AM

## 2019-11-05 NOTE — Assessment & Plan Note (Signed)
Uncontrolled cholesterol LDL 161 Last lipid panel 10/2019 The 10-year ASCVD risk score Mikey Bussing DC Jr., et al., 2013) is: 29.6%  Plan: 1. Counseling on ASCVD risk / hyperlipidemia - START Rosuvastatin 10mg  nightly, counseling on benefit risk side effect, dose titration 2. Encourage improved lifestyle - low carb/cholesterol, reduce portion size, continue improving regular exercise  Follow up labs fasting lipid CMET in 3 months to monitor progress on Statin

## 2019-11-05 NOTE — Assessment & Plan Note (Signed)
Last lab 07/2019 was reviewed Now we can repeat lab Thyroid panel in 01/2020 Continue current Levothyroxine 145mcg daily

## 2019-11-05 NOTE — Patient Instructions (Addendum)
Thank you for coming to the office today.  I am concerned that your Sleep Apnea problem may be preventing your BP from improving.  Also smoking can play a roll. Try to quit or reduce smoking.  -------------------------------------------------  [facility] Address: [address], [place], [place] [address] Phone: [phone]  --------------------------------------------------  1. Chemistry - Normal results, including electrolytes, kidney and liver function. Normal fasting blood sugar   2. Hemoglobin A1c (Diabetes screening) - 5.7, stable in range of Pre-Diabetes (>5.7 to 6.4)   3. PSA Prostate Cancer Screening - 4.0 (slight increase from 3.2 last year), negative.  4. Cholesterol - Abnormal cholesterol results. Elevated 161 LDL (bad cholesterol) The 10-year ASCVD risk score Mikey Bussing DC Jr., et al., 2013) is: 28% Started cholesterol med.  5. CBC Blood Counts - Normal, no anemia, other abnormality  Referral to return back to Dr Bernardo Heater at [facility] -[address] [place],  [place]  [address] Phone: [phone]  You are at increased risk of future Cardiovascular complications such as Heart Attack or Stroke from an artery blockage due to abnormal cholesterol and/or risk factors. - As discussed, Statin Cholesterol pills both can both LOWER cholesterol and REDUCE this future risk of heart attack and stroke - Start Rosuvastatin (generic Crestor) 10mg  pill once at bedtime every night  If you develop mild aches or pains in muscle or joint that does NOT improve or go away after first 3-4 weeks then this may require [place] to adjust the dose. First I would recommend STOPPING the medication for a few weeks until your ache and pain symptoms completely RESOLVE. Then you can restart at a LOWER DOSE either HALF a pill at bedtime every night or LESS OFTEN such as one pill a week  only and then gradually increase to every other day or max dose of 3 times a week  Lastly, sometimes we need to try other versions of this medicine to find one that works for you and does not cause side effects.  Start the Sildenafil (generic viagra) use goodrx.com coupon  ------- We can do Virtual telephone visit for results, after the 3 month lab.   DUE for FASTING BLOOD WORK (no food or drink after midnight before the lab appointment, only water or coffee without cream/sugar on the morning of)  SCHEDULE "Lab Only" visit in the morning at the clinic for lab draw in 3 MONTHS   - Make sure Lab Only appointment is at about 1 week before your next appointment, so that results will be available  For Lab Results, once available within 2-3 days of blood draw, you can can log in to MyChart online to view your results and a brief explanation. Also, we can discuss results at next follow-up visit.   Please schedule a Follow-up Appointment to: Return in about 3 months (around 02/04/2020) for 3 month fasting lab only then 1 week later Virtual Telephone Visit Cholesterol lab result.  If you have any other questions or concerns, please feel free to call the office or send a message through [place]. You may also schedule an earlier appointment if necessary.  Additionally, you may be receiving a survey about your experience at our office within a few days to 1 week by e-mail or mail. We value your feedback.  [name], DO [facility]

## 2019-11-05 NOTE — Assessment & Plan Note (Addendum)
Persistent clinical concern for suspected obstructive sleep apnea given reported symptoms with witnessed apnea, snoring and sleep disturbance, fatigue excessive sleepiness. - Screening: ESS score 12 / STOP-Bang Score 7 - Neck Circumference: 18" - Co-morbidities: HTN  Plan: 1. Discussion on initial diagnosis and testing for OSA, risk factors, management, complications 2. Agree to proceed with sleep study testing based on clinical concerns  When patient is financially ready we can send referral back to [facility] - he should let us know first

## 2019-11-07 ENCOUNTER — Encounter: Payer: Self-pay | Admitting: Urology

## 2019-11-20 ENCOUNTER — Encounter: Payer: Self-pay | Admitting: Urology

## 2019-11-20 ENCOUNTER — Ambulatory Visit (INDEPENDENT_AMBULATORY_CARE_PROVIDER_SITE_OTHER): Payer: BC Managed Care – PPO | Admitting: Urology

## 2019-11-20 ENCOUNTER — Other Ambulatory Visit: Payer: Self-pay

## 2019-11-20 VITALS — BP 167/80 | HR 65 | Ht 67.0 in | Wt 214.0 lb

## 2019-11-20 DIAGNOSIS — R3911 Hesitancy of micturition: Secondary | ICD-10-CM

## 2019-11-20 DIAGNOSIS — N529 Male erectile dysfunction, unspecified: Secondary | ICD-10-CM | POA: Diagnosis not present

## 2019-11-20 DIAGNOSIS — Z125 Encounter for screening for malignant neoplasm of prostate: Secondary | ICD-10-CM | POA: Diagnosis not present

## 2019-11-20 DIAGNOSIS — N401 Enlarged prostate with lower urinary tract symptoms: Secondary | ICD-10-CM

## 2019-11-20 LAB — BLADDER SCAN AMB NON-IMAGING: Scan Result: 0

## 2019-11-20 MED ORDER — SILDENAFIL CITRATE 20 MG PO TABS: ORAL_TABLET | ORAL | 6 refills | Status: DC

## 2019-11-20 NOTE — Patient Instructions (Addendum)
Sildenafil injection What is this medicine? SILDENAFIL (sil DEN a fil) is used to treat pulmonary arterial hypertension. This is a serious heart and lung condition. This medicine helps to improve symptoms and quality of life. This medicine may be used for other purposes; ask your health care provider or pharmacist if you have questions. COMMON BRAND NAME(S): Revatio What should I tell my health care provider before I take this medicine? They need to know if you have any of these conditions:  anatomical deformation of the penis, Peyronie's disease, or history of priapism (painful and prolonged erection)  bleeding disorders  eye disease, vision problems  heart disease  high or low blood pressure  history of blood diseases, like sickle cell anemia or leukemia  kidney disease  liver disease  pulmonary veno-occlusive disease (PVOD)  stomach ulcer  an unusual or allergic reaction to sildenafil, other medicines, foods, dyes, or preservatives  pregnant or trying to get pregnant  breast-feeding How should I use this medicine? This medicine is for injection into a vein. It is given by a health care professional in a hospital or clinic setting. Talk to your pediatrician regarding the use of this medicine in children. This medicine is not approved for use in children. Overdosage: If you think you have taken too much of this medicine contact a poison control center or emergency room at once. NOTE: This medicine is only for you. Do not share this medicine with others. What if I miss a dose? It is important not to miss your dose. Call your doctor or health care professional if you are unable to keep an appointment. What may interact with this medicine? Do not take this medicine with any of the following medications:  cisapride  cobicistat  nitrates like amyl nitrite, isosorbide dinitrate, isosorbide mononitrate, nitroglycerin  riociguat  telaprevir This medicine may also interact  with the following medications:  antiviral medicines for HIV or AIDS  bosentan  certain medicines for benign prostatic hyperplasia (BPH)  certain medicines for blood pressure  certain medicines for fungal infections like ketoconazole and itraconazole  cimetidine  erythromycin  rifampin This list may not describe all possible interactions. Give your health care provider a list of all the medicines, herbs, non-prescription drugs, or dietary supplements you use. Also tell them if you smoke, drink alcohol, or use illegal drugs. Some items may interact with your medicine. What should I watch for while using this medicine? Tell your doctor or healthcare professional if your symptoms do not start to get better or if they get worse. Tell your doctor or health care professional right away if you have any change in your eyesight or hearing. You may get dizzy. Do not drive, use machinery, or do anything that needs mental alertness until you know how this medicine affects you. Do not stand or sit up quickly, especially if you are an older patient. This reduces the risk of dizzy or fainting spells. Avoid alcoholic drinks; they can make you more dizzy. What side effects may I notice from receiving this medicine? Side effects that you should report to your doctor or health care professional as soon as possible:  allergic reactions like skin rash, itching or hives, swelling of the face, lips, or tongue  breathing problems  changes in vision  chest pain  decreased hearing  fast, irregular heartbeat  men: prolonged or painful erection (lasting more than 4 hours) Side effects that usually do not require medical attention (report to your doctor or health care professional  if they continue or are bothersome):  facial flushing  headache  nosebleed  trouble sleeping  upset stomach This list may not describe all possible side effects. Call your doctor for medical advice about side effects.  You may report side effects to FDA at 1-800-FDA-1088. Where should I keep my medicine? This drug is given in a hospital or clinic and will not be stored at home. NOTE: This sheet is a summary. It may not cover all possible information. If you have questions about this medicine, talk to your doctor, pharmacist, or health care provider.  2020 Elsevier/Gold Standard (2015-03-26 09:15:46)  Prostate Cancer Screening  Prostate cancer screening is a test that is done to check for the presence of prostate cancer in men. The prostate gland is a walnut-sized gland that is located below the bladder and in front of the rectum in males. The function of the prostate is to add fluid to semen during ejaculation. Prostate cancer is the second most common type of cancer in men. Who should have prostate cancer screening?  Screening recommendations vary based on age and other risk factors. Screening is recommended if:  You are older than age 73. If you are age 89-69, talk with your health care provider about your need for screening and how often screening should be done. Because most prostate cancers are slow growing and will not cause death, screening is generally reserved in this age group for men who have a 10-15-year life expectancy.  You are younger than age 23, and you have these risk factors: ? Being a black male or a male of African descent. ? Having a father, brother, or uncle who has been diagnosed with prostate cancer. The risk is higher if your family member's cancer occurred at an early age. Screening is not recommended if:  You are younger than age 42.  You are between the ages of 42 and 35 and you have no risk factors.  You are 48 years of age or older. At this age, the risks that screening can cause are greater than the benefits that it may provide. If you are at high risk for prostate cancer, your health care provider may recommend that you have screenings more often or that you start  screening at a younger age. How is screening for prostate cancer done? The recommended prostate cancer screening test is a blood test called the prostate-specific antigen (PSA) test. PSA is a protein that is made in the prostate. As you age, your prostate naturally produces more PSA. Abnormally high PSA levels may be caused by:  Prostate cancer.  An enlarged prostate that is not caused by cancer (benign prostatic hyperplasia, BPH). This condition is very common in older men.  A prostate gland infection (prostatitis). Depending on the PSA results, you may need more tests, such as:  A physical exam to check the size of your prostate gland.  Blood and imaging tests.  A procedure to remove tissue samples from your prostate gland for testing (biopsy). What are the benefits of prostate cancer screening?  Screening can help to identify cancer at an early stage, before symptoms start and when the cancer can be treated more easily.  There is a small chance that screening may lower your risk of dying from prostate cancer. The chance is small because prostate cancer is a slow-growing cancer, and most men with prostate cancer die from a different cause. What are the risks of prostate cancer screening? The main risk of prostate cancer screening  is diagnosing and treating prostate cancer that would never have caused any symptoms or problems. This is called overdiagnosisand overtreatment. PSA screening cannot tell you if your PSA is high due to cancer or a different cause. A prostate biopsy is the only procedure to diagnose prostate cancer. Even the results of a biopsy may not tell you if your cancer needs to be treated. Slow-growing prostate cancer may not need any treatment other than monitoring, so diagnosing and treating it may cause unnecessary stress or other side effects. A prostate biopsy may also cause:  Infection or fever.  A false negative. This is a result that shows that you do not have  prostate cancer when you actually do have prostate cancer. Questions to ask your health care provider  When should I start prostate cancer screening?  What is my risk for prostate cancer?  How often do I need screening?  What type of screening tests do I need?  How do I get my test results?  What do my results mean?  Do I need treatment? Where to find more information  The American Cancer Society: www.cancer.org  American Urological Association: www.auanet.org Contact a health care provider if:  You have difficulty urinating.  You have pain when you urinate or ejaculate.  You have blood in your urine or semen.  You have pain in your back or in the area of your prostate. Summary  Prostate cancer is a common type of cancer in men. The prostate gland is located below the bladder and in front of the rectum. This gland adds fluid to semen during ejaculation.  Prostate cancer screening may identify cancer at an early stage, when the cancer can be treated more easily.  The prostate-specific antigen (PSA) test is the recommended screening test for prostate cancer.  Discuss the risks and benefits of prostate cancer screening with your health care provider. If you are age 18 or older, the risks that screening can cause are greater than the benefits that it may provide. This information is not intended to replace advice given to you by your health care provider. Make sure you discuss any questions you have with your health care provider. Document Revised: 10/04/2018 Document Reviewed: 10/04/2018 Elsevier Patient Education  [place].  Benign Prostatic Hyperplasia  Benign prostatic hyperplasia (BPH) is an enlarged prostate gland that is caused by the normal aging process and not by cancer. The prostate is a walnut-sized gland that is involved in the production of semen. It is located in front of the rectum and below the bladder. The bladder stores urine and the urethra is  the tube that carries the urine out of the body. The prostate may get bigger as a man gets older. An enlarged prostate can press on the urethra. This can make it harder to pass urine. The build-up of urine in the bladder can cause infection. Back pressure and infection may progress to bladder damage and kidney (renal) failure. What are the causes? This condition is part of a normal aging process. However, not all men develop problems from this condition. If the prostate enlarges away from the urethra, urine flow will not be blocked. If it enlarges toward the urethra and compresses it, there will be problems passing urine. What increases the risk? This condition is more likely to develop in men over the age of 42 years. What are the signs or symptoms? Symptoms of this condition include:  Getting up often during the night to urinate.  Needing to  urinate frequently during the day.  Difficulty starting urine flow.  Decrease in size and strength of your urine stream.  Leaking (dribbling) after urinating.  Inability to pass urine. This needs immediate treatment.  Inability to completely empty your bladder.  Pain when you pass urine. This is more common if there is also an infection.  Urinary tract infection (UTI). How is this diagnosed? This condition is diagnosed based on your medical history, a physical exam, and your symptoms. Tests will also be done, such as:  A post-void bladder scan. This measures any amount of urine that may remain in your bladder after you finish urinating.  A digital rectal exam. In a rectal exam, your health care provider checks your prostate by putting a lubricated, gloved finger into your rectum to feel the back of your prostate gland. This exam detects the size of your gland and any abnormal lumps or growths.  An exam of your urine (urinalysis).  A prostate specific antigen (PSA) screening. This is a blood test used to screen for prostate cancer.  An  ultrasound. This test uses sound waves to electronically produce a picture of your prostate gland. Your health care provider may refer you to a specialist in kidney and prostate diseases (urologist). How is this treated? Once symptoms begin, your health care provider will monitor your condition (active surveillance or watchful waiting). Treatment for this condition will depend on the severity of your condition. Treatment may include:  Observation and yearly exams. This may be the only treatment needed if your condition and symptoms are mild.  Medicines to relieve your symptoms, including: ? Medicines to shrink the prostate. ? Medicines to relax the muscle of the prostate.  Surgery in severe cases. Surgery may include: ? Prostatectomy. In this procedure, the prostate tissue is removed completely through an open incision or with a laparoscope or robotics. ? Transurethral resection of the prostate (TURP). In this procedure, a tool is inserted through the opening at the tip of the penis (urethra). It is used to cut away tissue of the inner core of the prostate. The pieces are removed through the same opening of the penis. This removes the blockage. ? Transurethral incision (TUIP). In this procedure, small cuts are made in the prostate. This lessens the prostate's pressure on the urethra. ? Transurethral microwave thermotherapy (TUMT). This procedure uses microwaves to create heat. The heat destroys and removes a small amount of prostate tissue. ? Transurethral needle ablation (TUNA). This procedure uses radio frequencies to destroy and remove a small amount of prostate tissue. ? Interstitial laser coagulation ([place]). This procedure uses a laser to destroy and remove a small amount of prostate tissue. ? Transurethral electrovaporization (TUVP). This procedure uses electrodes to destroy and remove a small amount of prostate tissue. ? Prostatic urethral lift. This procedure inserts an implant to push the  lobes of the prostate away from the urethra. Follow these instructions at home:  Take over-the-counter and prescription medicines only as told by your health care provider.  Monitor your symptoms for any changes. Contact your health care provider with any changes.  Avoid drinking large amounts of liquid before going to bed or out in public.  Avoid or reduce how much caffeine or alcohol you drink.  Give yourself time when you urinate.  Keep all follow-up visits as told by your health care provider. This is important. Contact a health care provider if:  You have unexplained back pain.  Your symptoms do not get better with treatment.  You develop side effects from the medicine you are taking.  Your urine becomes very dark or has a bad smell.  Your lower abdomen becomes distended and you have trouble passing your urine. Get help right away if:  You have a fever or chills.  You suddenly cannot urinate.  You feel lightheaded, or very dizzy, or you faint.  There are large amounts of blood or clots in the urine.  Your urinary problems become hard to manage.  You develop moderate to severe low back or flank pain. The flank is the side of your body between the ribs and the hip. These symptoms may represent a serious problem that is an emergency. Do not wait to see if the symptoms will go away. Get medical help right away. Call your local emergency services (911 in the [place] Do not drive yourself to the hospital. Summary  Benign prostatic hyperplasia (BPH) is an enlarged prostate that is caused by the normal aging process and not by cancer.  An enlarged prostate can press on the urethra. This can make it hard to pass urine.  This condition is part of a normal aging process and is more likely to develop in men over the age of 5 years.  Get help right away if you suddenly cannot urinate. This information is not intended to replace advice given to you by your health care provider.  Make sure you discuss any questions you have with your health care provider. Document Revised: 01/16/2018 Document Reviewed: 03/28/2016 Elsevier Patient Education  2020 [employer].

## 2019-11-20 NOTE — Progress Notes (Signed)
   11/20/2019 4:12 PM   Todd Burns 1960/10/29 [id number]  Reason for visit: BPH, elevated PSA, ED  HPI: I saw Todd Burns in urology clinic today for evaluation of BPH, elevated PSA, NED.  Briefly, is a 59 year old relatively healthy male who was previously followed by Dr. Bernardo Heater, and was last seen in April 2019 for BPH and urinary symptoms.  He underwent a cystoscopy for evaluation of a possible outlet procedure which showed lateral lobe enlargement with a prostatic length of 4 cm, and no median lobe, and bladder was grossly normal-appearing.  He was previously on maximal medical therapy with Flomax and finasteride, but discontinued finasteride about a year ago secondary to cost.  He is currently on Flomax daily, and occasionally will take a double dose if he feels like his urinary stream is weak.  IPSS score today is 23, with quality of life unhappy.  PVR 0 mL.  He is interested in learning more about alternatives to medical therapy.  He also has had trouble with erections recently, and was recently started on sildenafil 20 to 100 mg on demand by his PCP.  He tried 40 mg last weekend with good results.  We discussed the risks and benefits of this medication again at length.  Finally, he was recently found to have a borderline PSA of 4 from a baseline of ~3.  He denies any family history of prostate or breast cancer. DRE today 60g, smooth, no nodules or masses  We had a very long conversation today about his numerous urologic issues.  Regarding his BPH, we primarily discussed UroLift as a minimally invasive treatment option to get off of medication and improve his urinary stream and urinary symptoms.  Patient information was provided, and he would like to read more about this prior to making a decision.  Regarding the erections, will continue sildenafil on demand.  Finally, in terms of the PSA, I recommended a repeat PSA today with reflex to free-if PSA elevated would need to pursue prostate biopsy,  and we reviewed the AUA guidelines regarding PSA screening, as well as the risks and benefits of prostate biopsy at length.  If PSA is stable consider pursuing UroLift for urinary symptoms  Will call with PSA results   I spent 45 total minutes on the day of the encounter including pre-visit review of the medical record, face-to-face time with the patient, and post visit ordering of labs/imaging/tests.  Billey Co, [facility] [address] [place], [place] [address] [phone]

## 2019-11-21 LAB — URINALYSIS, COMPLETE
Bilirubin, UA: NEGATIVE
Glucose, UA: NEGATIVE
Ketones, UA: NEGATIVE
Leukocytes,UA: NEGATIVE
Nitrite, UA: NEGATIVE
Protein,UA: NEGATIVE
Specific Gravity, UA: 1.015 (ref 1.005–1.030)
Urobilinogen, Ur: 1 mg/dL (ref 0.2–1.0)
pH, UA: 6 (ref 5.0–7.5)

## 2019-11-21 LAB — MICROSCOPIC EXAMINATION
Bacteria, UA: NONE SEEN
Epithelial Cells (non renal): NONE SEEN /hpf (ref 0–10)
WBC, UA: NONE SEEN /hpf (ref 0–5)

## 2019-11-21 LAB — PSA TOTAL (REFLEX TO FREE): Prostate Specific Ag, Serum: 5.5 ng/mL — ABNORMAL HIGH (ref 0.0–4.0)

## 2019-11-21 LAB — FPSA% REFLEX
% FREE PSA: 21.5 %
PSA, FREE: 1.18 ng/mL

## 2019-12-03 ENCOUNTER — Telehealth: Payer: Self-pay

## 2019-12-03 NOTE — Telephone Encounter (Signed)
Called pt no answer. LM for pt per DPR informing him of the information below. Advised pt to call back to schedule appt.

## 2019-12-03 NOTE — Telephone Encounter (Signed)
-----   Message from Billey Co, MD sent at 12/03/2019  8:25 AM EDT ----- PSA continues to rise and is 5.5 now, would recommend prostate biopsy as we discussed in clinic. Please review instructions and schedule prostate biopsy, thanks  Nickolas Madrid, MD 12/03/2019

## 2019-12-04 ENCOUNTER — Telehealth: Payer: Self-pay | Admitting: Urology

## 2019-12-04 NOTE — Telephone Encounter (Signed)
BX scheduled Instructions given to patient  Todd Burns

## 2019-12-25 ENCOUNTER — Other Ambulatory Visit: Payer: Self-pay

## 2019-12-25 ENCOUNTER — Encounter: Payer: Self-pay | Admitting: Urology

## 2019-12-25 ENCOUNTER — Ambulatory Visit (INDEPENDENT_AMBULATORY_CARE_PROVIDER_SITE_OTHER): Payer: BC Managed Care – PPO | Admitting: Urology

## 2019-12-25 VITALS — BP 172/76 | HR 53 | Ht 67.0 in | Wt 208.8 lb

## 2019-12-25 DIAGNOSIS — R972 Elevated prostate specific antigen [PSA]: Secondary | ICD-10-CM

## 2019-12-25 DIAGNOSIS — N401 Enlarged prostate with lower urinary tract symptoms: Secondary | ICD-10-CM

## 2019-12-25 DIAGNOSIS — C61 Malignant neoplasm of prostate: Secondary | ICD-10-CM | POA: Diagnosis not present

## 2019-12-25 DIAGNOSIS — N138 Other obstructive and reflux uropathy: Secondary | ICD-10-CM

## 2019-12-25 MED ORDER — LEVOFLOXACIN 500 MG PO TABS
500.0000 mg | ORAL_TABLET | Freq: Once | ORAL | Status: AC
  Administered 2019-12-25: 500 mg via ORAL

## 2019-12-25 MED ORDER — GENTAMICIN SULFATE 40 MG/ML IJ SOLN
80.0000 mg | Freq: Once | INTRAMUSCULAR | Status: AC
  Administered 2019-12-25: 80 mg via INTRAMUSCULAR

## 2019-12-25 NOTE — Progress Notes (Signed)
   12/25/19  Indication: Elevated PSA, 5.5 (21.5% free)  Prostate Biopsy Procedure   Informed consent was obtained, and we discussed the risks of bleeding and infection/sepsis. A time out was performed to ensure correct patient identity.  Pre-Procedure: - Last PSA Level: 5.5 - Gentamicin and levaquin given for antibiotic prophylaxis - Transrectal Ultrasound performed revealing a 82 gm prostate, PSA density 0.07 - No significant hypoechoic or median lobe noted  Procedure: - Prostate block performed using 10 cc 1% lidocaine and biopsies taken from sextant areas, a total of 12 under ultrasound guidance.  Post-Procedure: - Patient tolerated the procedure well - He was counseled to seek immediate medical attention if experiences significant bleeding, fevers, or severe pain - Return in one week to discuss biopsy results  Assessment/ Plan: Will follow up in 1-2 weeks to discuss pathology If biopsy negative, like pursue outlet procedure(Urolift v HoLEP)  Nickolas Madrid, MD 12/25/2019

## 2019-12-25 NOTE — Patient Instructions (Signed)

## 2019-12-27 LAB — SURGICAL PATHOLOGY

## 2020-01-01 ENCOUNTER — Encounter: Payer: Self-pay | Admitting: Urology

## 2020-01-01 ENCOUNTER — Ambulatory Visit (INDEPENDENT_AMBULATORY_CARE_PROVIDER_SITE_OTHER): Payer: BC Managed Care – PPO | Admitting: Urology

## 2020-01-01 ENCOUNTER — Other Ambulatory Visit: Payer: Self-pay

## 2020-01-01 VITALS — BP 164/82 | HR 71 | Ht 67.0 in | Wt 211.0 lb

## 2020-01-01 DIAGNOSIS — C61 Malignant neoplasm of prostate: Secondary | ICD-10-CM | POA: Diagnosis not present

## 2020-01-01 DIAGNOSIS — N401 Enlarged prostate with lower urinary tract symptoms: Secondary | ICD-10-CM | POA: Diagnosis not present

## 2020-01-01 DIAGNOSIS — N529 Male erectile dysfunction, unspecified: Secondary | ICD-10-CM | POA: Diagnosis not present

## 2020-01-01 DIAGNOSIS — N138 Other obstructive and reflux uropathy: Secondary | ICD-10-CM | POA: Diagnosis not present

## 2020-01-01 NOTE — Patient Instructions (Signed)

## 2020-01-01 NOTE — Progress Notes (Signed)
01/01/2020 11:23 AM   Todd Burns 06-02-1960 [id number]  Reason for visit: Follow up ED, prostate biopsy results, BPH  HPI: I saw Todd Burns and his partner back in urology clinic for the above issues. Briefly, he is a 59 year old relatively healthy male with a long history of worsening urinary symptoms(IPSS 23 with quality of life unhappy), and most recently an elevated PSA. PSA was recently elevated to 5.5 and he underwent a prostate biopsy with me on 12/25/2019 that showed an 82 g prostate with PSA density of 0.07, and 1/12 cores involved with Gleason score 3+3=6 prostate adenocarcinoma with only 4% core involvement.  His primary complaint continues to be severe urinary symptoms of weak stream and difficulty emptying, and he is currently needing to take Flomax twice daily just to initiate urination. If he misses a dose of Flomax he has to strain significantly with severe pelvic pain to empty his bladder. His ED has been moderately responsive to sildenafil taking 2-3 tabs on demand as needed.  We had a lengthy conversation today about the patient's new diagnosis of prostate cancer.  We reviewed the risk classifications per the AUA guidelines including very low risk, low risk, intermediate risk, and high risk disease, and the need for additional staging imaging with CT and bone scan in patients with unfavorable intermediate risk and high risk disease.  I explained that his life expectancy, clinical stage, Gleason score, PSA, and other co-morbidities influence treatment strategies.  We discussed the roles of active surveillance, radiation therapy, surgical therapy with robotic prostatectomy, and hormone therapy with androgen deprivation.  We discussed that patients urinary symptoms also impact treatment strategy, as patients with severe lower urinary tract symptoms may have significant worsening or even develop urinary retention after undergoing radiation.  In regards to surgery, we discussed  robotic prostatectomy +/- lymphadenectomy at length.  The procedure takes 3 to 4 hours, and patient's typically discharge home on post-op day #1.  A Foley catheter is left in place for 7 to 10 days to allow for healing of the vesicourethral anastomosis.  There is a small risk of bleeding, infection, damage to surrounding structures or bowel, hernia, DVT/PE, or serious cardiac or pulmonary complications.  We discussed at length post-op side effects including erectile dysfunction, and the importance of pre-operative erectile function on long-term outcomes.  Even with a nerve sparing approach, there is an approximately 25% rate of permanent erectile dysfunction.  We also discussed postop urinary incontinence at length.  We expect patients to have stress incontinence post-operatively that will improve over period of weeks to months.  Less than 10% of men will require a pad at 1 year after surgery.  Patients will need to avoid heavy lifting and strenuous activity for 3 to 4 weeks, but most men return to their baseline activity status by 6 weeks.  In summary, Todd Burns is a 59 y.o. man with newly diagnosed very low risk prostate cancer. In the setting of his very small amount of very low risk prostate cancer(4% core involvement of a single core, Gleason score 3+3=6) and severe BPH and urinary symptoms with very large 82 g prostate, I recommended he pursue HOLEP for his severe urinary symptoms. I think this will have the lowest likelihood of long-term erectile dysfunction and incontinence, and we can continue to follow the PSA closely and if it rises significantly in the future can consider repeat biopsy and/or radiation. We reviewed at length that this is not a treatment for his prostate  cancer, but for his severe urinary symptoms and blockage from an enlarged prostate, and we need to continue to follow the PSA in the future and he may need further treatments for prostate cancer, though that would be unlikely based  on his very low risk disease.  Schedule HoLEP(82g) Increase sildenafil to up to 5 tabs as needed for ED, consider Cialis in the future if refractory symptoms  I spent 45 total minutes on the day of the encounter including pre-visit review of the medical record, face-to-face time with the patient, and post visit ordering of labs/imaging/tests.  Billey Co, [facility] [address] [place], [place] [address] [phone]

## 2020-01-01 NOTE — H&P (View-Only) (Signed)
01/01/2020 11:23 AM   Todd Burns 1960/04/01 [id number]  Reason for visit: Follow up ED, prostate biopsy results, BPH  HPI: I saw Todd Burns and his partner back in urology clinic for the above issues. Briefly, he is a 59 year old relatively healthy male with a long history of worsening urinary symptoms(IPSS 23 with quality of life unhappy), and most recently an elevated PSA. PSA was recently elevated to 5.5 and he underwent a prostate biopsy with me on 12/25/2019 that showed an 82 g prostate with PSA density of 0.07, and 1/12 cores involved with Gleason score 3+3=6 prostate adenocarcinoma with only 4% core involvement.  His primary complaint continues to be severe urinary symptoms of weak stream and difficulty emptying, and he is currently needing to take Flomax twice daily just to initiate urination. If he misses a dose of Flomax he has to strain significantly with severe pelvic pain to empty his bladder. His ED has been moderately responsive to sildenafil taking 2-3 tabs on demand as needed.  We had a lengthy conversation today about the patient's new diagnosis of prostate cancer.  We reviewed the risk classifications per the AUA guidelines including very low risk, low risk, intermediate risk, and high risk disease, and the need for additional staging imaging with CT and bone scan in patients with unfavorable intermediate risk and high risk disease.  I explained that his life expectancy, clinical stage, Gleason score, PSA, and other co-morbidities influence treatment strategies.  We discussed the roles of active surveillance, radiation therapy, surgical therapy with robotic prostatectomy, and hormone therapy with androgen deprivation.  We discussed that patients urinary symptoms also impact treatment strategy, as patients with severe lower urinary tract symptoms may have significant worsening or even develop urinary retention after undergoing radiation.  In regards to surgery, we discussed  robotic prostatectomy +/- lymphadenectomy at length.  The procedure takes 3 to 4 hours, and patient's typically discharge home on post-op day #1.  A Foley catheter is left in place for 7 to 10 days to allow for healing of the vesicourethral anastomosis.  There is a small risk of bleeding, infection, damage to surrounding structures or bowel, hernia, DVT/PE, or serious cardiac or pulmonary complications.  We discussed at length post-op side effects including erectile dysfunction, and the importance of pre-operative erectile function on long-term outcomes.  Even with a nerve sparing approach, there is an approximately 25% rate of permanent erectile dysfunction.  We also discussed postop urinary incontinence at length.  We expect patients to have stress incontinence post-operatively that will improve over period of weeks to months.  Less than 10% of men will require a pad at 1 year after surgery.  Patients will need to avoid heavy lifting and strenuous activity for 3 to 4 weeks, but most men return to their baseline activity status by 6 weeks.  In summary, Todd Burns is a 59 y.o. man with newly diagnosed very low risk prostate cancer. In the setting of his very small amount of very low risk prostate cancer(4% core involvement of a single core, Gleason score 3+3=6) and severe BPH and urinary symptoms with very large 82 g prostate, I recommended he pursue HOLEP for his severe urinary symptoms. I think this will have the lowest likelihood of long-term erectile dysfunction and incontinence, and we can continue to follow the PSA closely and if it rises significantly in the future can consider repeat biopsy and/or radiation. We reviewed at length that this is not a treatment for his prostate  cancer, but for his severe urinary symptoms and blockage from an enlarged prostate, and we need to continue to follow the PSA in the future and he may need further treatments for prostate cancer, though that would be unlikely based  on his very low risk disease.  Schedule HoLEP(82g) Increase sildenafil to up to 5 tabs as needed for ED, consider Cialis in the future if refractory symptoms  I spent 45 total minutes on the day of the encounter including pre-visit review of the medical record, face-to-face time with the patient, and post visit ordering of labs/imaging/tests.  Billey Co, [facility] [address], [place], [place] [address] [phone]

## 2020-01-09 ENCOUNTER — Other Ambulatory Visit: Payer: Self-pay

## 2020-01-09 DIAGNOSIS — C61 Malignant neoplasm of prostate: Secondary | ICD-10-CM

## 2020-01-13 ENCOUNTER — Other Ambulatory Visit: Payer: Self-pay | Admitting: Urology

## 2020-01-13 DIAGNOSIS — N138 Other obstructive and reflux uropathy: Secondary | ICD-10-CM

## 2020-01-13 DIAGNOSIS — N401 Enlarged prostate with lower urinary tract symptoms: Secondary | ICD-10-CM

## 2020-01-14 ENCOUNTER — Other Ambulatory Visit: Payer: Self-pay

## 2020-01-14 ENCOUNTER — Other Ambulatory Visit: Payer: BC Managed Care – PPO

## 2020-01-14 DIAGNOSIS — C61 Malignant neoplasm of prostate: Secondary | ICD-10-CM

## 2020-01-16 ENCOUNTER — Other Ambulatory Visit
Admission: RE | Admit: 2020-01-16 | Discharge: 2020-01-16 | Disposition: A | Discharge status: Home / Self Care | Payer: BC Managed Care – PPO | Source: Ambulatory Visit | Attending: Urology | Admitting: Urology

## 2020-01-16 NOTE — Pre-Procedure Instructions (Signed)
Unable to reach patient for PAT interview. Generic VM left x2 Office notified patient rescheduled to 01/20/20

## 2020-01-17 LAB — CULTURE, URINE COMPREHENSIVE

## 2020-01-20 ENCOUNTER — Encounter
Admission: RE | Admit: 2020-01-20 | Discharge: 2020-01-20 | Disposition: A | Discharge status: Home / Self Care | Payer: BC Managed Care – PPO | Source: Ambulatory Visit | Attending: Urology | Admitting: Urology

## 2020-01-20 NOTE — Patient Instructions (Signed)
Your procedure is scheduled on: Friday, November 19 Report to the Registration Desk on the 1st floor of the [facility]. To find out your arrival time, please call [phone] between 1PM - 3PM on: Thursday, November 18  REMEMBER: Instructions that are not followed completely may result in serious medical risk, up to and including death; or upon the discretion of your surgeon and anesthesiologist your surgery may need to be rescheduled.  Do not eat food after midnight the night before surgery.  No gum chewing, lozengers or hard candies.  You may however, drink CLEAR liquids up to 2 hours before you are scheduled to arrive for your surgery. Do not drink anything within 2 hours of your scheduled arrival time.  Clear liquids include: - water  - apple juice without pulp - gatorade (not RED, PURPLE, OR BLUE) - black coffee or tea (Do NOT add milk or creamers to the coffee or tea) Do NOT drink anything that is not on this list.  Type 1 and Type 2 diabetics should only drink water.  In addition, your doctor has ordered for you to drink the provided  Ensure Pre-Surgery Clear Carbohydrate Drink  Gatorade G2 Drinking this carbohydrate drink up to two hours before surgery helps to reduce insulin resistance and improve patient outcomes. Please complete drinking 2 hours prior to scheduled arrival time.  TAKE THESE MEDICATIONS THE MORNING OF SURGERY WITH A SIP OF WATER:  1.  Amlodipine 2.  Finasteride 3.  Levothyroxine 4.  Omeprazole - (take one the night before and one on the morning of surgery - helps to prevent nausea after surgery.) 5.  Tamsulosin (Flomax)  One week prior to surgery: Stop Anti-inflammatories (NSAIDS) such as Advil, Aleve, Ibuprofen, Motrin, Naproxen, Naprosyn and Aspirin based products such as Excedrin, Goodys Powder, BC Powder. Stop ANY OVER THE COUNTER supplements until after surgery.  No Alcohol for 24 hours before or after surgery.  No Smoking including  e-cigarettes for 24 hours prior to surgery.  No chewable tobacco products for at least 6 hours prior to surgery.  No nicotine patches on the day of surgery.  Do not use any "recreational" drugs for at least a week prior to your surgery.  Please be advised that the combination of cocaine and anesthesia may have negative outcomes, up to and including death. If you test positive for cocaine, your surgery will be cancelled.  On the morning of surgery brush your teeth with toothpaste and water, you may rinse your mouth with mouthwash if you wish. Do not swallow any toothpaste or mouthwash.  Do not wear jewelry, make-up, hairpins, clips or nail polish.  Do not wear lotions, powders, or perfumes.   Do not shave body from the neck down 48 hours prior to surgery just in case you cut yourself which could leave a site for infection.  Also, freshly shaved skin may become irritated if using the CHG soap.  Contact lenses, hearing aids and dentures may not be worn into surgery.  Do not bring valuables to the hospital. [facility] is not responsible for any missing/lost belongings or valuables.   Bring your C-PAP to the hospital with you in case you may have to spend the night.   Notify your doctor if there is any change in your medical condition (cold, fever, infection).  Wear comfortable clothing (specific to your surgery type) to the hospital.  Plan for stool softeners for home use; pain medications have a tendency to cause constipation. You can also help  prevent constipation by eating foods high in fiber such as fruits and vegetables and drinking plenty of fluids as your diet allows.  After surgery, you can help prevent lung complications by doing breathing exercises.  Take deep breaths and cough every 1-2 hours. Your doctor may order a device called an Incentive Spirometer to help you take deep breaths.  If you are being discharged the day of surgery, you will not be allowed to drive  home. You will need a responsible adult (18 years or older) to drive you home and stay with you that night.   If you are taking public transportation, you will need to have a responsible adult (18 years or older) with you. Please confirm with your physician that it is acceptable to use public transportation.   Please call the [facility] Dept. at [phone] if you have any questions about these instructions.  Visitation Policy:  Patients undergoing a surgery or procedure may have one family member or support person with them as long as that person is not COVID-19 positive or experiencing its symptoms.  That person may remain in the waiting area during the procedure.

## 2020-01-21 ENCOUNTER — Encounter
Admission: RE | Admit: 2020-01-21 | Discharge: 2020-01-21 | Disposition: A | Discharge status: Home / Self Care | Payer: BC Managed Care – PPO | Source: Ambulatory Visit | Attending: Urology | Admitting: Urology

## 2020-01-21 ENCOUNTER — Other Ambulatory Visit: Payer: Self-pay

## 2020-01-21 HISTORY — DX: Prediabetes: R73.03

## 2020-01-21 NOTE — Patient Instructions (Signed)
Your procedure is scheduled on: 01/24/20 Report to [facility]. To find out your arrival time please call [phone] between 1PM - 3PM on 01/23/20.  Remember: Instructions that are not followed completely may result in serious medical risk, up to and including death, or upon the discretion of your surgeon and anesthesiologist your surgery may need to be rescheduled.     _X__ 1. Do not eat food after midnight the night before your procedure.                 No gum chewing or hard candies. You may drink clear liquids up to 2 hours                 before you are scheduled to arrive for your surgery- DO not drink clear                 liquids within 2 hours of the start of your surgery.                 Clear Liquids include:  water, apple juice without pulp, clear carbohydrate                 drink such as Clearfast or Gatorade, Black Coffee or Tea (Do not add                 anything to coffee or tea). Diabetics water only  __X__2.  On the morning of surgery brush your teeth with toothpaste and water, you                 may rinse your mouth with mouthwash if you wish.  Do not swallow any              toothpaste of mouthwash.     _X__ 3.  No Alcohol for 24 hours before or after surgery.   _X__ 4.  Do Not Smoke or use e-cigarettes For 24 Hours Prior to Your Surgery.                 Do not use any chewable tobacco products for at least 6 hours prior to                 surgery.  ____  5.  Bring all medications with you on the day of surgery if instructed.   __X__  6.  Notify your doctor if there is any change in your medical condition      (cold, fever, infections).     Do not wear jewelry, make-up, hairpins, clips or nail polish. Do not wear lotions, powders, or perfumes.  Do not shave 48 hours prior to surgery. Men may shave face and neck. Do not bring valuables to the hospital.    [facility] is not responsible for any belongings  or valuables.  Contacts, dentures/partials or body piercings may not be worn into surgery. Bring a case for your contacts, glasses or hearing aids, a denture cup will be supplied. Leave your suitcase in the car. After surgery it may be brought to your room. For patients admitted to the hospital, discharge time is determined by your treatment team.   Patients discharged the day of surgery will not be allowed to drive home.   Please read over the following fact sheets that you were given:   MRSA Information  __X__ Take these medicines the morning of surgery with A SIP OF WATER:  1. amLODipine (NORVASC) 10 MG tablet  2. levothyroxine (SYNTHROID) 175 MCG tablet  3. omeprazole (PRILOSEC) 40 MG capsule  4. tamsulosin (FLOMAX) 0.4 MG CAPS capsule  5.  6.  ____ Fleet Enema (as directed)   __X__ Use CHG Soap/SAGE wipes as directed  ____ Use inhalers on the day of surgery  ____ Stop metformin/Janumet/Farxiga 2 days prior to surgery    ____ Take 1/2 of usual insulin dose the night before surgery. No insulin the morning          of surgery.   ____ Stop Blood Thinners Coumadin/Plavix/Xarelto/Pleta/Pradaxa/Eliquis/Effient/Aspirin  on   Or contact your Surgeon, Cardiologist or Medical Doctor regarding  ability to stop your blood thinners  __X__ Stop Anti-inflammatories 7 days before surgery such as Advil, Ibuprofen, Motrin,  BC or Goodies Powder, Naprosyn, Naproxen, Aleve, Aspirin    __X__ Stop all herbal supplements, fish oil or vitamin E until after surgery.    ____ Bring C-Pap to the hospital.

## 2020-01-22 ENCOUNTER — Encounter: Payer: Self-pay | Admitting: Internal Medicine

## 2020-01-22 ENCOUNTER — Other Ambulatory Visit: Payer: BC Managed Care – PPO

## 2020-01-22 ENCOUNTER — Encounter
Admission: RE | Admit: 2020-01-22 | Discharge: 2020-01-22 | Disposition: A | Discharge status: Home / Self Care | Payer: BC Managed Care – PPO | Source: Ambulatory Visit | Attending: Urology | Admitting: Urology

## 2020-01-22 ENCOUNTER — Ambulatory Visit (INDEPENDENT_AMBULATORY_CARE_PROVIDER_SITE_OTHER): Payer: BC Managed Care – PPO | Admitting: Internal Medicine

## 2020-01-22 VITALS — BP 158/88 | HR 68 | Ht 67.0 in | Wt 211.0 lb

## 2020-01-22 DIAGNOSIS — I10 Essential (primary) hypertension: Secondary | ICD-10-CM | POA: Diagnosis not present

## 2020-01-22 DIAGNOSIS — R9431 Abnormal electrocardiogram [ECG] [EKG]: Secondary | ICD-10-CM

## 2020-01-22 DIAGNOSIS — Z01810 Encounter for preprocedural cardiovascular examination: Secondary | ICD-10-CM | POA: Diagnosis not present

## 2020-01-22 DIAGNOSIS — Z01818 Encounter for other preprocedural examination: Secondary | ICD-10-CM | POA: Insufficient documentation

## 2020-01-22 DIAGNOSIS — Z79899 Other long term (current) drug therapy: Secondary | ICD-10-CM | POA: Diagnosis not present

## 2020-01-22 DIAGNOSIS — F17200 Nicotine dependence, unspecified, uncomplicated: Secondary | ICD-10-CM | POA: Diagnosis not present

## 2020-01-22 DIAGNOSIS — E785 Hyperlipidemia, unspecified: Secondary | ICD-10-CM | POA: Diagnosis not present

## 2020-01-22 DIAGNOSIS — N138 Other obstructive and reflux uropathy: Secondary | ICD-10-CM | POA: Diagnosis not present

## 2020-01-22 DIAGNOSIS — K219 Gastro-esophageal reflux disease without esophagitis: Secondary | ICD-10-CM | POA: Diagnosis not present

## 2020-01-22 DIAGNOSIS — G4733 Obstructive sleep apnea (adult) (pediatric): Secondary | ICD-10-CM | POA: Diagnosis not present

## 2020-01-22 DIAGNOSIS — I517 Cardiomegaly: Secondary | ICD-10-CM | POA: Diagnosis not present

## 2020-01-22 DIAGNOSIS — Z20822 Contact with and (suspected) exposure to covid-19: Secondary | ICD-10-CM | POA: Insufficient documentation

## 2020-01-22 DIAGNOSIS — N401 Enlarged prostate with lower urinary tract symptoms: Secondary | ICD-10-CM | POA: Diagnosis not present

## 2020-01-22 DIAGNOSIS — Z791 Long term (current) use of non-steroidal anti-inflammatories (NSAID): Secondary | ICD-10-CM | POA: Diagnosis not present

## 2020-01-22 DIAGNOSIS — C61 Malignant neoplasm of prostate: Secondary | ICD-10-CM | POA: Diagnosis not present

## 2020-01-22 DIAGNOSIS — R001 Bradycardia, unspecified: Secondary | ICD-10-CM | POA: Diagnosis not present

## 2020-01-22 DIAGNOSIS — Z79890 Hormone replacement therapy (postmenopausal): Secondary | ICD-10-CM | POA: Diagnosis not present

## 2020-01-22 LAB — SARS CORONAVIRUS 2 (TAT 6-24 HRS): SARS Coronavirus 2: NEGATIVE

## 2020-01-22 NOTE — Patient Instructions (Signed)
Medication Instructions:  Your physician recommends that you continue on your current medications as directed. Please refer to the Current Medication list given to you today.  *If you need a refill on your cardiac medications before your next appointment, please call your pharmacy*   Lab Work: none If you have labs (blood work) drawn today and your tests are completely normal, you will receive your results only by: Marland Kitchen MyChart Message (if you have MyChart) OR . A paper copy in the mail If you have any lab test that is abnormal or we need to change your treatment, we will call you to review the results.   Testing/Procedures: Echo today or tomorrow Your physician has requested that you have an echocardiogram. Echocardiography is a painless test that uses sound waves to create images of your heart. It provides your doctor with information about the size and shape of your heart and how well your heart's chambers and valves are working. This procedure takes approximately one hour. There are no restrictions for this procedure. There is a possibility that an IV may need to be started during your test to inject an image enhancing agent. This is done to obtain more optimal pictures of your heart. Therefore we ask that you do at least drink some water prior to coming in to hydrate your veins.   Follow-Up: At [facility], you and your health needs are our priority.  As part of our continuing mission to provide you with exceptional heart care, we have created designated Provider Care Teams.  These Care Teams include your primary Cardiologist (physician) and Advanced Practice Providers (APPs -  Physician Assistants and Nurse Practitioners) who all work together to provide you with the care you need, when you need it.  We recommend signing up for the patient portal called "MyChart".  Sign up information is provided on this After Visit Summary.  MyChart is used to connect with patients for Virtual Visits  (Telemedicine).  Patients are able to view lab/test results, encounter notes, upcoming appointments, etc.  Non-urgent messages can be sent to your provider as well.   To learn more about what you can do with MyChart, go to [email or web address].    Your next appointment:   6 week(s)  The format for your next appointment:   In Person  Provider:   You may see DR Harrell Gave END or one of the following Advanced Practice Providers on your designated Care Team:    Murray Hodgkins, NP  Christell Faith, PA-C  Marrianne Mood, PA-C  Cadence Kathlen Mody, [place]  Laurann Montana, NP    Other Instructions Request records from Dr Humphrey Rolls asap.

## 2020-01-22 NOTE — Progress Notes (Addendum)
  [facility] Perioperative Services: Pre-Admission/Anesthesia Testing     Date: 01/22/20  Name: Todd Burns MRN:   [id number]  Re: EKG concerns and need for cardiology clearance prior to surgery  Patient is scheduled for HoLEP procedure on 01/24/2020 with Dr. Nickolas Madrid.  In preparation for his procedure, patient presented to the PAT clinic today for preoperative labs and ECG.  ECG performed in clinic revealed sinus bradycardia at a rate of 58 bpm.  Patient meets voltage criteria for LVH.  Patient with inferolateral TWIs in leads I, II, aVL, and V4-V6.  There have been minor changes noted when compared to previous ECG performed on 09/23/2014.  Question ischemic changes versus changes associated with his LVH.  Patient has never been seen in consult by cardiology.   PMH (+) for multiple cardiac risk factors including age, race, obesity (last BMI 32.08 kg/m), tobacco and ETOH use, untreated OSAH (cannot afford CPAP), prediabetes, HTN, and HLD. Also of concern, patient is on a PDE5i medication for ED. Given my concerns regarding the findings on ECG I reached out to patient's PCP to discuss.  Spoke with PCP via [facility] secure messaging.  ECGs reviewed by MD; agrees with my concerns. PCP notes that HTN has not always been well controlled and patient recently started on statin therapy for risk factor reduction.  I discussed that my feelings were that patient needed to be evaluated by cardiology prior to proceeding with planned procedure on 01/24/2020.  MD in agreement that patient would benefit from being seen by cardiology and clearance is most certainly warranted.   Call placed to [facility] in efforts to secure patient an appointment for presurgical clearance.  Practice has availability for patient to be seen today.  Patient contacted with appointment details.  Will follow up with attending surgeons office to make them aware of disposition following review of  documentation from consult visit with cardiology.  ADDENDUM: 01/22/2020 at 1705 - patient seen by cardiology today. Note pending, however preliminary information available notes that patient has indeed been seen by cardiologist Humphrey Rolls, MD) in the past for abnormal ECG.   Honor Loh, MSN, APRN, FNP-C, CEN [facility]  Peri-operative Services Nurse Practitioner Phone: [phone] 01/22/20 11:43 AM

## 2020-01-22 NOTE — Progress Notes (Addendum)
New Outpatient Visit Date: 01/22/2020  Primary Care Provider: Olin Hauser, DO [address] [place],  [place] [address]  Chief Complaint: Abnormal EKG  HPI:  Mr. Demirjian is a 59 y.o. male who is being seen today for the evaluation of abnormal EKG and preop assessment at the request of Honor Loh, NP. He has a history of hypertension, prediabetes, hypothyroidism, and BPH.  He is scheduled to undergo laser enucleation of the prostate with Dr. Diamantina Providence in two days.  He presented for preop evaluation today and was found to have an abnormal EKG, prompting urgent referral to [place].  Mr. Manning reports feeling well other than LUTS.  He denies chest pain, shortness of breath, palpitations, lightheadedness, edema, and syncope.  He notes that he was found to have an abnormal EKG prior to shoulder surgery in 2016, which resulted in referral to Dr. Humphrey Rolls.  Several tests were reportedly performed, including a stress test as well as possibly an echocardiogram and/or cardiac CTA.  Mr. Benyo was told that his EKG changes were a result of hypertension.  Otherwise, he believes things were normal.  He has not seen Dr. Humphrey Rolls for a few years.  --------------------------------------------------------------------------------------------------  Cardiovascular History & Procedures: Cardiovascular Problems:  Abnormal EKG  Risk Factors:  Hypertension, male gender, obesity, age > 69, and tobacco use  Cath/PCI:  None  CV Surgery:  None  EP Procedures and Devices:  None  Non-Invasive Evaluation(s):  None available.  Recent CV Pertinent Labs: Lab Results  Component Value Date   CHOL 231 (H) 10/29/2019   HDL 47 10/29/2019   LDLCALC 161 (H) 10/29/2019   TRIG 113 10/29/2019   CHOLHDL 4.9 10/29/2019   K 4.1 10/29/2019   BUN 16 10/29/2019   CREATININE 1.12 10/29/2019    --------------------------------------------------------------------------------------------------  Past Medical History:    Diagnosis Date  . BPH (benign prostatic hyperplasia)   . Hypertension   . Hypothyroidism   . Pre-diabetes   . Tear of MCL (medial collateral ligament) of knee     Past Surgical History:  Procedure Laterality Date  . COLONOSCOPY N/A 08/08/2014   Procedure: COLONOSCOPY;  Surgeon: Lucilla Lame, MD;  Location: [facility];  Service: Gastroenterology;  Laterality: N/A;  . MEDIAL COLLATERAL LIGAMENT REPAIR, KNEE  1999  . SHOULDER ARTHROSCOPY WITH ROTATOR CUFF REPAIR Right 10/01/2014   Procedure: SHOULDER ARTHROSCOPY WITH ROTATOR CUFF REPAIR;  Surgeon: Earnestine Leys, MD;  Location: [facility];  Service: Orthopedics;  Laterality: Right;  Mini open rotator cuff repair Arthroscopy  Distal clavicle excision Subacromial decompression    Current Meds  Medication Sig  . amLODipine (NORVASC) 10 MG tablet Take 1 tablet (10 mg total) by mouth daily.  . finasteride (PROSCAR) 5 MG tablet Take 1 tablet (5 mg total) by mouth daily.  . fluticasone (FLONASE) 50 MCG/ACT nasal spray Place 2 sprays into both nostrils daily. Use for 4-6 weeks then stop and use seasonally or as needed.  Marland Kitchen levothyroxine (SYNTHROID) 175 MCG tablet Take 1 tablet (175 mcg total) by mouth daily before breakfast.  . losartan (COZAAR) 100 MG tablet Take 1 tablet (100 mg total) by mouth daily.  . montelukast (SINGULAIR) 10 MG tablet Take 1 tablet (10 mg total) by mouth at bedtime.  Marland Kitchen omeprazole (PRILOSEC) 40 MG capsule Take 1 capsule (40 mg total) by mouth daily before breakfast. 4-6 weeks then can taper down off med  . rosuvastatin (CRESTOR) 10 MG tablet Take 1 tablet (10 mg total) by mouth at bedtime.  Marland Kitchen  sildenafil (REVATIO) 20 MG tablet Take 1-5 pills about 60 min prior to sex. Start with 1 and increase as needed.  . tamsulosin (FLOMAX) 0.4 MG CAPS capsule Take 1 capsule (0.4 mg total) by mouth 2 (two) times daily.  Marland Kitchen triamcinolone cream (KENALOG) 0.1 % Apply 1 application topically 2 (two) times daily. As needed for bug bites or  itching    Allergies: Lisinopril  Social History   Tobacco Use  . Smoking status: Current Every Day Smoker    Packs/day: 1.00    Years: 15.00    Pack years: 15.00    Types: Cigarettes  . Smokeless tobacco: Never Used  Vaping Use  . Vaping Use: Former  Substance Use Topics  . Alcohol use: Not Currently    Comment: Drinks every month or two  . Drug use: No    Family History  Problem Relation Age of Onset  . Cancer Mother        thyroid  . Cancer Father        lung  . Thyroid disease Brother   . Cancer Maternal Aunt        thyroid  . Cancer Sister   . Thyroid disease Brother   . Cancer Sister     Review of Systems: A 12-system review of systems was performed and was negative except as noted in the HPI.  --------------------------------------------------------------------------------------------------  Physical Exam: BP (!) 158/88   Pulse 68   Ht 5\' 7"  (1.702 m)   Wt 211 lb (95.7 kg)   BMI 33.05 kg/m   General:  NAD. HEENT: No conjunctival pallor or scleral icterus. Facemask in place. Neck: Supple without lymphadenopathy, thyromegaly, JVD, or HJR. No carotid bruit. Lungs: Normal work of breathing. Clear to auscultation bilaterally without wheezes or crackles. Heart: Regular rate and rhythm without murmurs, rubs, or gallops. Non-displaced PMI. Abd: Bowel sounds present. Soft, NT/ND without hepatosplenomegaly Ext: No lower extremity edema. Radial, PT, and DP pulses are 2+ bilaterally Skin: Warm and dry without rash. Neuro: CNIII-XII intact. Strength and fine-touch sensation intact in upper and lower extremities bilaterally. Psych: Normal mood and affect.  EKG:  Normal sinus rhythm with LVH, inferolateral T wave inversions, and nonspecific ST changes.  Overall appearance is similar to prior tracing from 09/23/2014, though inferolateral T wave inversions are slightly more pronounced on today's tracing.  Lab Results  Component Value Date   WBC 6.8 10/29/2019    HGB 14.2 10/29/2019   HCT 43.3 10/29/2019   MCV 84.2 10/29/2019   PLT 185 10/29/2019    Lab Results  Component Value Date   NA 138 10/29/2019   K 4.1 10/29/2019   CL 105 10/29/2019   CO2 27 10/29/2019   BUN 16 10/29/2019   CREATININE 1.12 10/29/2019   GLUCOSE 95 10/29/2019   ALT 10 10/29/2019    Lab Results  Component Value Date   CHOL 231 (H) 10/29/2019   HDL 47 10/29/2019   LDLCALC 161 (H) 10/29/2019   TRIG 113 10/29/2019   CHOLHDL 4.9 10/29/2019     --------------------------------------------------------------------------------------------------  ASSESSMENT AND PLAN: Abnormal EKG and preop assessment: EKG changes are most suggestive of LVH with abnormal repolarization and are similar to prior tracing from 2016.  I suspect the ST/T changes reflect abnormal repolarization, based on report of unremarkable testing (other than likely LVH) by Dr. Humphrey Rolls at that time.  We will request records regarding prior workup from Dr. Humphrey Rolls and will obtain an echocardiogram tomorrow.  If prior workup is without evidence  of ischemic heart disease and echocardiogram is without significant structural abnormality, I think it would be reasonable for Mr. Frickey to proceed with surgery as planned, as it is a low risk procedure and he does not endorse any symptoms of unstable cardiac disease.  Hypertension: Not well-controlled today.  This very well may be the underlying cause of his EKG changes.  As above, we will obtain an echo and outside records.  We will defer ongoing management of hypertension to Dr. Jerene Pitch.  Addendum (01/23/20 @ 3:35 PM): Outside records from Dr. Humphrey Rolls in 2016 have been reviewed.  Notable findings include echo with mild to moderate LVH, equivocal myocardial perfusion stress test, and normal cardiac CTA without significant CAD.  Echocardiogram today was personally reviewed, demonstrating severe left ventricular hypertrophy with asymmetric involvement affecting the septum.  While  this could represent hypertensive heart disease, findings are also suspicious for hypertrophic cardiomyopathy.  Given lack of systolic anterior motion or LVOT gradient by echo as well as absent symptoms, I think it is reasonable for Mr. Bosler to proceed with his prostate surgery tomorrow, which is low risk.  Cardiac MRI will be discussed with Mr. Essick when he returns for follow-up for further assessment of possible HCM.  Follow-up: Return to clinic in 6 weeks.  Nelva Bush, MD 01/22/2020 7:05 PM

## 2020-01-23 ENCOUNTER — Encounter: Payer: Self-pay | Admitting: Urology

## 2020-01-23 ENCOUNTER — Other Ambulatory Visit: Payer: Self-pay | Admitting: Internal Medicine

## 2020-01-23 ENCOUNTER — Ambulatory Visit (INDEPENDENT_AMBULATORY_CARE_PROVIDER_SITE_OTHER): Payer: BC Managed Care – PPO

## 2020-01-23 ENCOUNTER — Other Ambulatory Visit: Payer: Self-pay

## 2020-01-23 DIAGNOSIS — I517 Cardiomegaly: Secondary | ICD-10-CM | POA: Diagnosis not present

## 2020-01-23 DIAGNOSIS — Z01810 Encounter for preprocedural cardiovascular examination: Secondary | ICD-10-CM

## 2020-01-23 LAB — ECHOCARDIOGRAM COMPLETE
AR max vel: 4.29 cm2
AV Area VTI: 3.94 cm2
AV Area mean vel: 3.64 cm2
AV Mean grad: 3 mmHg
AV Peak grad: 6.1 mmHg
Ao pk vel: 1.23 m/s
Area-P 1/2: 2.75 cm2
Calc EF: 55.4 %
S' Lateral: 2.8 cm
Single Plane A2C EF: 65.6 %
Single Plane A4C EF: 49.5 %

## 2020-01-23 MED ORDER — ORAL CARE MOUTH RINSE: 15.0000 mL | Freq: Once | OROMUCOSAL | Status: AC

## 2020-01-23 MED ORDER — CHLORHEXIDINE GLUCONATE 0.12 % MT SOLN: 15.0000 mL | Freq: Once | OROMUCOSAL | Status: AC

## 2020-01-23 MED ORDER — LACTATED RINGERS IV SOLN: INTRAVENOUS | Status: DC

## 2020-01-23 MED ORDER — CEFAZOLIN SODIUM-DEXTROSE 2-4 GM/100ML-% IV SOLN
2.0000 g | INTRAVENOUS | Status: AC
  Administered 2020-01-24: 2 g via INTRAVENOUS

## 2020-01-23 NOTE — Progress Notes (Signed)
[facility] Perioperative Services  Pre-Admission/Anesthesia Testing Clinical Review  Date: 01/23/20  Patient Demographics:  Name: Todd Burns DOB:   Sep 24, 1960 MRN:   [id number]  Planned Surgical Procedure(s):    Case: [id number] Date/Time: 01/24/20 1450   Procedure: HOLEP-LASER ENUCLEATION OF THE PROSTATE WITH MORCELLATION (N/A )   Anesthesia type: Choice   Pre-op diagnosis: BPH   Location: [facility] OR ROOM 10 / [facility]   Surgeons: Billey Co, MD     NOTE: Available PAT nursing documentation and vital signs have been reviewed. Clinical nursing staff has updated patient's PMH/PSHx, current medication list, and drug allergies/intolerances to ensure comprehensive history available to assist in medical decision making as it pertains to the aforementioned surgical procedure and anticipated anesthetic course.   Clinical Discussion:  Todd Burns is a 59 y.o. male who is submitted for pre-surgical anesthesia review and clearance prior to him undergoing the above procedure. Patient is a Current Smoker (15 pack years). Pertinent PMH includes: abnormal EKG, LVH, HTN, HLD, prediabetes, OSAH (untreated; cannot afford PAP machine), GERD (on daily PPI), hypothyroidism, BPH, ED (on PDE5i)  Patient scheduled to undergo HoLEP procedure on 01/24/2020 with Dr. Nickolas Burns.  As part of patient's presurgical work-up, patient presented to the PAT clinic on 01/22/2020 for an ECG.  ECG performed in clinic showed sinus bradycardia at a rate of 58 bpm with LVH.  There were ST and marked inferolateral T wave inversion in leads I, II, aVL, and V4-V6.  ECG compared to previous tracing done on 09/23/2014.  While findings are similar, inferolateral T wave inversions noted to be more pronounced.  Changes concerning for ischemia versus changes associated with patient's known LVH. Patient initially advised PAT nurse that he had never been seen by cardiology thus patient was  referred to [facility] for preoperative evaluation and clearance. In efforts to avoid postponing patient's planned surgical procedure, PAT was able to obtain an expedited appointment with cardiology for this patient.  Patient was seen by cardiology on 01/22/2020; notes reviewed.  Notes indicate the patient advised that he had indeed been seen by cardiology in the past Todd Rolls, MD) for evaluation of an presurgical ECG in 2016; has not been seen since.  Patient advised physician that he had previously undergone cardiovascular testing with Dr. Humphrey Burns.   At the time of his clinic visit with Dr. Saunders Burns on 01/22/2020, patient denied angina, shortness of breath, PND, orthopnea, palpitations, vertiginous symptoms, peripheral edema, and presyncope/syncope.  Patient had no complaints other than symptoms associated with his enlarged prostate.  Blood pressure elevated in the office at 158/88.  ECG in the office revealed NSR at rate of 68 bpm with LVH, inferolateral T wave inversions, and nonspecific ST changes.  Cardiologist agreed that inferolateral T wave inversions are slightly more pronounced as compared to tracing from 2016. Patient on GDMT using CCB and ARB medications.  He is on a statin for his HLD.  Patient reported to be active with a functional capacity, per DASI, of >4 METS.  Given EKG changes, cardiology requesting TTE to assess for structural abnormality.  Additionally, cardiology with plans to request previous cardiovascular testing work-up from Dr. Laurelyn Sickle office.  Once TTE and previous work-up documentation reviewed, barring any evidence of ischemic heart disease or structural abnormality, plans are to clear patient for surgery as planned as he does not endorse any symptoms of significant cardiac disease  Copies of cardiovascular testing received from Dr. Laurelyn Sickle office.  ECG performed  at that time revealed sinus bradycardia with LVH at a rate of 57 bpm, inferolateral TWIs present and lead I, 2, aVL, V5-V6.   Additionally there was minimal septal ST elevation noted TTE performed on 09/29/2014 demonstrated normal left ventricular systolic function with mild apical hypokinesis; LVEF 64%.  Myocardial perfusion imaging study performed on 09/30/2014 revealed a small mild lateral wall reversible defect with a normal LVEF of 52%.  Patient underwent coronary CTA on 12/19/2014 that revealed a normal coronary arteries with a calcium score of 0.  Finally, patient had a CTA of his carotid arteries performed on 01/05/2015 that revealed only tortuosity of the distal right internal carotid and minimal calcification at the right carotid bifurcation (see full interpretation of cardiovascular testing below).  All of these records have been sent to Dr. Saunders Burns for review and I have had a follow up conversation with him about the previous testing. TTE performed earlier today pending interpretation, however preliminary review by cardiologist reveals normal LVEF with severe LVH and mild valvular disease.  Per Dr. Saunders Burns (verbal), "I think it is fine for patient to proceed with surgery as planned.  We may need to do further evaluation to differentiate between severe hypertensive heart disease and hypertrophic cardiomyopathy when he follows up with [place] in clinic, however that should not delay his surgery given that he is asymptomatic". MD to enter official documentation of review and recommendations once TTE has been fully interpreted. This patient is not on daily anticoagulation therapy.   He denies previous perioperative complications with anesthesia. He underwent a general anesthetic course here (ASA II) in 09/2014 with no documented complications.   Vitals with BMI 01/22/2020 01/21/2020 01/01/2020  Height 5\' 7"  5\' 8"  5\' 7"   Weight 211 lbs 211 lbs 211 lbs  BMI 33.04 27.25 36.64  Systolic 403 - 474  Diastolic 88 - 82  Pulse 68 - 71    Providers/Specialists:   NOTE: Primary physician provider listed below. Patient may have been seen by  APP or partner within same practice.   PROVIDER ROLE LAST Ranae Pila, MD Urology (Surgeon)  01/01/2020  Olin Hauser, DO Primary Care Provider  11/05/2019  Nelva Bush, MD Cardiology  01/22/2020   Allergies:  Lisinopril  Current Home Medications:   No current facility-administered medications for this encounter.   Marland Kitchen amLODipine (NORVASC) 10 MG tablet  . finasteride (PROSCAR) 5 MG tablet  . fluticasone (FLONASE) 50 MCG/ACT nasal spray  . levothyroxine (SYNTHROID) 175 MCG tablet  . losartan (COZAAR) 100 MG tablet  . montelukast (SINGULAIR) 10 MG tablet  . naproxen (NAPROSYN) 500 MG tablet  . omeprazole (PRILOSEC) 40 MG capsule  . rosuvastatin (CRESTOR) 10 MG tablet  . sildenafil (REVATIO) 20 MG tablet  . tamsulosin (FLOMAX) 0.4 MG CAPS capsule  . triamcinolone cream (KENALOG) 0.1 %   History:   Past Medical History:  Diagnosis Date  . BPH (benign prostatic hyperplasia)   . GERD (gastroesophageal reflux disease)   . Hypertension   . Hypothyroidism   . LVH (left ventricular hypertrophy)   . Pre-diabetes   . Tear of MCL (medial collateral ligament) of knee    Past Surgical History:  Procedure Laterality Date  . COLONOSCOPY N/A 08/08/2014   Procedure: COLONOSCOPY;  Surgeon: Lucilla Lame, MD;  Location: [facility];  Service: Gastroenterology;  Laterality: N/A;  . MEDIAL COLLATERAL LIGAMENT REPAIR, KNEE  1999  . SHOULDER ARTHROSCOPY WITH ROTATOR CUFF REPAIR Right 10/01/2014   Procedure: SHOULDER ARTHROSCOPY WITH ROTATOR  CUFF REPAIR;  Surgeon: Earnestine Leys, MD;  Location: [facility];  Service: Orthopedics;  Laterality: Right;  Mini open rotator cuff repair Arthroscopy  Distal clavicle excision Subacromial decompression   Family History  Problem Relation Age of Onset  . Cancer Mother        thyroid  . Cancer Father        lung  . Thyroid disease Brother   . Cancer Maternal Aunt        thyroid  . Cancer Sister   . Thyroid disease Brother    . Cancer Sister    Social History   Tobacco Use  . Smoking status: Current Every Day Smoker    Packs/day: 1.00    Years: 15.00    Pack years: 15.00    Types: Cigarettes  . Smokeless tobacco: Never Used  Vaping Use  . Vaping Use: Former  Substance Use Topics  . Alcohol use: Not Currently    Comment: Drinks every month or two  . Drug use: No    Pertinent Clinical Results:  LABS: Labs reviewed: Acceptable for surgery.       Hospital Outpatient Visit on 01/22/2020  Component Date Value Ref Range Status  . SARS Coronavirus 2 01/22/2020 NEGATIVE  NEGATIVE Final   Comment: (NOTE) SARS-CoV-2 target nucleic acids are NOT DETECTED.  The SARS-CoV-2 RNA is generally detectable in upper and lower respiratory specimens during the acute phase of infection. Negative results do not preclude SARS-CoV-2 infection, do not rule out co-infections with other pathogens, and should not be used as the sole basis for treatment or other patient management decisions. Negative results must be combined with clinical observations, patient history, and epidemiological information. The expected result is Negative.  Fact Sheet for Patients: SugarRoll.be  Fact Sheet for Healthcare Providers: https://www.woods-mathews.com/  This test is not yet approved or cleared by the [place] FDA and  has been authorized for detection and/or diagnosis of SARS-CoV-2 by FDA under an Emergency Use Authorization (EUA). This EUA will remain  in effect (meaning this test can be used) for the duration of the COVID-19 declaration under Se                          ction 564(b)(1) of the Act, 21 U.S.C. section 360bbb-3(b)(1), unless the authorization is terminated or revoked sooner.  Performed at [facility], [place] [address], [place], [place] [address]     ECG: Date: 01/22/2020 Time ECG obtained: 1027 AM Rate: 58 bpm Rhythm: sinus bradycardia with  LVH Intervals: PR 166 ms. QRS 94 ms. QTc 449 ms. ST segment and T wave changes: ST and marked T wave inferolateral T wave abnormalities Comparison: Similar to previous tracing obtained on 09/23/2014; lateral TWIs more pronounced   IMAGING / PROCEDURES: CTA CAROTID ARTERIES done on 01/05/2015 1. Vascular structures appear unremarkable except for tortuosity of the distal right internal carotid and minimal calcification at the right carotid bifurcation 2. No mass, adenopathy, inflammatory process 3. Negative CT of the neck  CARDIAC CTA performed on 12/19/2014 1. Calcium score is 0 2. Right dominant system 3. Normal coronaries  LEXISCAN done on 09/30/2014 1. LVEF 52% 2. Small mild lateral wall reversible defect 3. Functional capacity documented at 10.2 METS 4. Resting vital signs: Blood pressure 128/58 with heart rate of 49 bpm 5. Stress vital signs: Blood pressure 178/48 with a heart rate of 150 bpm 6. EKG results: Sinus bradycardia at rate of 49 bpm.  Lateral T wave inversion.  No ST depression at peak exercise. 7. Equivocal stress test with normal LVEF  ECHOCARDIOGRAM performed on 09/29/2014 1. LVEF 64% 2. Severely dilated left atrium with mildly dilated right atrium 3. Ventricles and aorta appear normal in size 4. Normal left ventricular systolic function 5. Mild apical hypokinesis 6. Mild to moderate left-ventricular hypertrophy with grade 1 diastolic dysfunction 7. Mild pulmonary regurgitation 8. Trace to mild tricuspid regurgitation 9. Mild mitral regurgitation 10. Normal PASP 11. No pericardial effusion   Impression and Plan:  Todd Burns has been referred for pre-anesthesia review and clearance prior to him undergoing the planned anesthetic and procedural courses. Available labs, pertinent testing, and imaging results were personally reviewed by me. This patient has been appropriately cleared by cardiology.   Based on clinical review performed today (01/23/20),  barring any significant acute changes in the patient's overall condition, it is anticipated that he will be able to proceed with the planned surgical intervention. Any acute changes in clinical condition may necessitate his procedure being postponed and/or cancelled. Pre-surgical instructions were reviewed with the patient during his PAT appointment and questions were fielded by PAT clinical staff.  Honor Loh, MSN, APRN, FNP-C, CEN [facility]  Peri-operative Services Nurse Practitioner Phone: [phone] 01/23/20 2:42 PM  NOTE: This note has been prepared using Dragon dictation software. Despite my best ability to proofread, there is always the potential that unintentional transcriptional errors may still occur from this process.

## 2020-01-24 ENCOUNTER — Ambulatory Visit
Admission: RE | Admit: 2020-01-24 | Discharge: 2020-01-24 | Disposition: A | Discharge status: Home / Self Care | Payer: BC Managed Care – PPO | Attending: Urology | Admitting: Urology

## 2020-01-24 ENCOUNTER — Ambulatory Visit: Payer: BC Managed Care – PPO | Admitting: Urgent Care

## 2020-01-24 ENCOUNTER — Other Ambulatory Visit: Payer: Self-pay

## 2020-01-24 ENCOUNTER — Encounter
Admission: RE | Disposition: A | Discharge status: Home / Self Care | Payer: Self-pay | Source: Home / Self Care | Attending: Urology

## 2020-01-24 ENCOUNTER — Encounter: Payer: Self-pay | Admitting: Urology

## 2020-01-24 DIAGNOSIS — R001 Bradycardia, unspecified: Secondary | ICD-10-CM | POA: Diagnosis not present

## 2020-01-24 DIAGNOSIS — Z79890 Hormone replacement therapy (postmenopausal): Secondary | ICD-10-CM | POA: Diagnosis not present

## 2020-01-24 DIAGNOSIS — E039 Hypothyroidism, unspecified: Secondary | ICD-10-CM | POA: Diagnosis not present

## 2020-01-24 DIAGNOSIS — F17200 Nicotine dependence, unspecified, uncomplicated: Secondary | ICD-10-CM | POA: Diagnosis not present

## 2020-01-24 DIAGNOSIS — Z20822 Contact with and (suspected) exposure to covid-19: Secondary | ICD-10-CM | POA: Insufficient documentation

## 2020-01-24 DIAGNOSIS — I517 Cardiomegaly: Secondary | ICD-10-CM | POA: Insufficient documentation

## 2020-01-24 DIAGNOSIS — C61 Malignant neoplasm of prostate: Secondary | ICD-10-CM | POA: Diagnosis not present

## 2020-01-24 DIAGNOSIS — K219 Gastro-esophageal reflux disease without esophagitis: Secondary | ICD-10-CM | POA: Insufficient documentation

## 2020-01-24 DIAGNOSIS — N400 Enlarged prostate without lower urinary tract symptoms: Secondary | ICD-10-CM | POA: Diagnosis not present

## 2020-01-24 DIAGNOSIS — I10 Essential (primary) hypertension: Secondary | ICD-10-CM | POA: Insufficient documentation

## 2020-01-24 DIAGNOSIS — Z79899 Other long term (current) drug therapy: Secondary | ICD-10-CM | POA: Insufficient documentation

## 2020-01-24 DIAGNOSIS — E785 Hyperlipidemia, unspecified: Secondary | ICD-10-CM | POA: Diagnosis not present

## 2020-01-24 DIAGNOSIS — N138 Other obstructive and reflux uropathy: Secondary | ICD-10-CM | POA: Diagnosis not present

## 2020-01-24 DIAGNOSIS — N401 Enlarged prostate with lower urinary tract symptoms: Secondary | ICD-10-CM | POA: Insufficient documentation

## 2020-01-24 DIAGNOSIS — G4733 Obstructive sleep apnea (adult) (pediatric): Secondary | ICD-10-CM | POA: Diagnosis not present

## 2020-01-24 DIAGNOSIS — Z791 Long term (current) use of non-steroidal anti-inflammatories (NSAID): Secondary | ICD-10-CM | POA: Diagnosis not present

## 2020-01-24 HISTORY — DX: Gastro-esophageal reflux disease without esophagitis: K21.9

## 2020-01-24 HISTORY — PX: HOLEP-LASER ENUCLEATION OF THE PROSTATE WITH MORCELLATION: SHX6641

## 2020-01-24 HISTORY — DX: Cardiomegaly: I51.7

## 2020-01-24 SURGERY — ENUCLEATION, PROSTATE, USING LASER, WITH MORCELLATION
Anesthesia: General

## 2020-01-24 MED ORDER — SUGAMMADEX SODIUM 200 MG/2ML IV SOLN
INTRAVENOUS | Status: DC | PRN
  Administered 2020-01-24: 200 mg via INTRAVENOUS

## 2020-01-24 MED ORDER — BELLADONNA ALKALOIDS-OPIUM 16.2-60 MG RE SUPP
RECTAL | Status: DC | PRN
  Administered 2020-01-24: 1 via RECTAL

## 2020-01-24 MED ORDER — ONDANSETRON HCL 4 MG/2ML IJ SOLN
INTRAMUSCULAR | Status: DC | PRN
  Administered 2020-01-24: 4 mg via INTRAVENOUS

## 2020-01-24 MED ORDER — LIDOCAINE HCL (PF) 2 % IJ SOLN
INTRAMUSCULAR | Status: AC
  Filled 2020-01-24: qty 5

## 2020-01-24 MED ORDER — BELLADONNA ALKALOIDS-OPIUM 16.2-60 MG RE SUPP
RECTAL | Status: AC
  Filled 2020-01-24: qty 1

## 2020-01-24 MED ORDER — KETOROLAC TROMETHAMINE 30 MG/ML IJ SOLN
INTRAMUSCULAR | Status: AC
  Filled 2020-01-24: qty 1

## 2020-01-24 MED ORDER — PROPOFOL 10 MG/ML IV BOLUS
INTRAVENOUS | Status: AC
  Filled 2020-01-24: qty 20

## 2020-01-24 MED ORDER — MIDAZOLAM HCL 2 MG/2ML IJ SOLN
INTRAMUSCULAR | Status: DC | PRN
  Administered 2020-01-24: 2 mg via INTRAVENOUS

## 2020-01-24 MED ORDER — DEXAMETHASONE SODIUM PHOSPHATE 10 MG/ML IJ SOLN
INTRAMUSCULAR | Status: AC
  Filled 2020-01-24: qty 1

## 2020-01-24 MED ORDER — CHLORHEXIDINE GLUCONATE 0.12 % MT SOLN
OROMUCOSAL | Status: AC
  Administered 2020-01-24: 15 mL via OROMUCOSAL
  Filled 2020-01-24: qty 15

## 2020-01-24 MED ORDER — DEXAMETHASONE SODIUM PHOSPHATE 10 MG/ML IJ SOLN
INTRAMUSCULAR | Status: DC | PRN
  Administered 2020-01-24: 6 mg via INTRAVENOUS

## 2020-01-24 MED ORDER — ACETAMINOPHEN 10 MG/ML IV SOLN
INTRAVENOUS | Status: AC
  Filled 2020-01-24: qty 100

## 2020-01-24 MED ORDER — HYDROCODONE-ACETAMINOPHEN 5-325 MG PO TABS: 1.0000 | ORAL_TABLET | ORAL | 0 refills | Status: AC | PRN

## 2020-01-24 MED ORDER — ROCURONIUM BROMIDE 10 MG/ML (PF) SYRINGE
PREFILLED_SYRINGE | INTRAVENOUS | Status: AC
  Filled 2020-01-24: qty 10

## 2020-01-24 MED ORDER — MIDAZOLAM HCL 2 MG/2ML IJ SOLN
INTRAMUSCULAR | Status: AC
  Filled 2020-01-24: qty 2

## 2020-01-24 MED ORDER — ONDANSETRON HCL 4 MG/2ML IJ SOLN: 4.0000 mg | Freq: Once | INTRAMUSCULAR | Status: DC | PRN

## 2020-01-24 MED ORDER — FENTANYL CITRATE (PF) 100 MCG/2ML IJ SOLN: 25.0000 ug | INTRAMUSCULAR | Status: DC | PRN

## 2020-01-24 MED ORDER — ACETAMINOPHEN 10 MG/ML IV SOLN
INTRAVENOUS | Status: DC | PRN
  Administered 2020-01-24: 1000 mg via INTRAVENOUS

## 2020-01-24 MED ORDER — EPHEDRINE SULFATE 50 MG/ML IJ SOLN
INTRAMUSCULAR | Status: DC | PRN
  Administered 2020-01-24 (×2): 5 mg via INTRAVENOUS

## 2020-01-24 MED ORDER — LIDOCAINE HCL (CARDIAC) PF 100 MG/5ML IV SOSY
PREFILLED_SYRINGE | INTRAVENOUS | Status: DC | PRN
  Administered 2020-01-24: 100 mg via INTRAVENOUS

## 2020-01-24 MED ORDER — CEFAZOLIN SODIUM-DEXTROSE 2-4 GM/100ML-% IV SOLN
INTRAVENOUS | Status: AC
  Filled 2020-01-24: qty 100

## 2020-01-24 MED ORDER — FENTANYL CITRATE (PF) 100 MCG/2ML IJ SOLN
INTRAMUSCULAR | Status: AC
  Administered 2020-01-24: 25 ug via INTRAVENOUS
  Filled 2020-01-24: qty 2

## 2020-01-24 MED ORDER — FENTANYL CITRATE (PF) 100 MCG/2ML IJ SOLN
INTRAMUSCULAR | Status: AC
  Filled 2020-01-24: qty 2

## 2020-01-24 MED ORDER — PROPOFOL 10 MG/ML IV BOLUS
INTRAVENOUS | Status: DC | PRN
  Administered 2020-01-24: 150 mg via INTRAVENOUS

## 2020-01-24 MED ORDER — FENTANYL CITRATE (PF) 100 MCG/2ML IJ SOLN
INTRAMUSCULAR | Status: DC | PRN
  Administered 2020-01-24 (×2): 50 ug via INTRAVENOUS

## 2020-01-24 MED ORDER — ROCURONIUM BROMIDE 100 MG/10ML IV SOLN
INTRAVENOUS | Status: DC | PRN
  Administered 2020-01-24: 50 mg via INTRAVENOUS
  Administered 2020-01-24: 20 mg via INTRAVENOUS
  Administered 2020-01-24: 10 mg via INTRAVENOUS

## 2020-01-24 MED ORDER — EPHEDRINE 5 MG/ML INJ
INTRAVENOUS | Status: AC
  Filled 2020-01-24: qty 10

## 2020-01-24 MED ORDER — ONDANSETRON HCL 4 MG/2ML IJ SOLN
INTRAMUSCULAR | Status: AC
  Filled 2020-01-24: qty 2

## 2020-01-24 SURGICAL SUPPLY — 39 items
ADAPTER IRRIG TUBE 2 SPIKE SOL (ADAPTER) ×4 IMPLANT
ADPR TBG 2 SPK PMP STRL ASCP (ADAPTER) ×2
BAG DRN LRG CPC RND TRDRP CNTR (MISCELLANEOUS) ×1
BAG URO DRAIN 4000ML (MISCELLANEOUS) ×2 IMPLANT
CATH FOLEY 3WAY 30CC 24FR (CATHETERS) ×2
CATH URETL 5X70 OPEN END (CATHETERS) ×2 IMPLANT
CATH URTH STD 24FR FL 3W 2 (CATHETERS) ×1 IMPLANT
CONTAINER COLLECT MORCELLATR (MISCELLANEOUS) ×1 IMPLANT
DRAPE UTILITY 15X26 TOWEL STRL (DRAPES) IMPLANT
ELECT BIVAP BIPO 22/24 DONUT (ELECTROSURGICAL)
ELECTRD BIVAP BIPO 22/24 DONUT (ELECTROSURGICAL) IMPLANT
FIBER LASER FLEXIVA PULSE 550 (Laser) ×2 IMPLANT
FILTER OVERFLOW MORCELLATOR (FILTER) ×1 IMPLANT
GLOVE BIOGEL PI IND STRL 7.5 (GLOVE) ×1 IMPLANT
GLOVE BIOGEL PI INDICATOR 7.5 (GLOVE) ×1
GOWN STRL REUS W/ TWL LRG LVL3 (GOWN DISPOSABLE) ×1 IMPLANT
GOWN STRL REUS W/ TWL XL LVL3 (GOWN DISPOSABLE) ×1 IMPLANT
GOWN STRL REUS W/TWL LRG LVL3 (GOWN DISPOSABLE) ×2
GOWN STRL REUS W/TWL XL LVL3 (GOWN DISPOSABLE) ×2
HOLDER FOLEY CATH W/STRAP (MISCELLANEOUS) ×2 IMPLANT
KIT TURNOVER CYSTO (KITS) ×2 IMPLANT
MANIFOLD NEPTUNE II (INSTRUMENTS) ×2 IMPLANT
MBRN O SEALING YLW 17 FOR INST (MISCELLANEOUS) ×2
MEMBRANE SLNG YLW 17 FOR INST (MISCELLANEOUS) ×1 IMPLANT
MORCELLATOR COLLECT CONTAINER (MISCELLANEOUS) ×2
MORCELLATOR OVERFLOW FILTER (FILTER) ×2
MORCELLATOR ROTATION 4.75 335 (MISCELLANEOUS) ×2 IMPLANT
PACK CYSTO AR (MISCELLANEOUS) ×2 IMPLANT
SET CYSTO W/LG BORE CLAMP LF (SET/KITS/TRAYS/PACK) ×2 IMPLANT
SET IRRIG Y TYPE TUR BLADDER L (SET/KITS/TRAYS/PACK) ×2 IMPLANT
SLEEVE PROTECTION STRL DISP (MISCELLANEOUS) ×4 IMPLANT
SOL .9 NS 3000ML IRR  AL (IV SOLUTION) ×6
SOL .9 NS 3000ML IRR AL (IV SOLUTION) ×6
SOL .9 NS 3000ML IRR UROMATIC (IV SOLUTION) ×6 IMPLANT
SURGILUBE 2OZ TUBE FLIPTOP (MISCELLANEOUS) ×2 IMPLANT
SYR TOOMEY IRRIG 70ML (MISCELLANEOUS) ×2
SYRINGE TOOMEY IRRIG 70ML (MISCELLANEOUS) ×1 IMPLANT
TUBE PUMP MORCELLATOR PIRANHA (TUBING) ×2 IMPLANT
WATER STERILE IRR 1000ML POUR (IV SOLUTION) ×2 IMPLANT

## 2020-01-24 NOTE — Interval H&P Note (Signed)
UROLOGY H&P UPDATE  Agree with prior H&P dated 01/01/2020.  59 year old male with single core of very low risk prostate cancer, and severe obstructive urinary symptoms with 82 g prostate who has opted for HOLEP.  Cardiac: RRR Lungs: CTA bilaterally  Laterality: N/A Procedure: HOLEP  Urine: Culture 11/9 no growth  We discussed the risks and benefits of HoLEP at length.  The procedure requires general anesthesia and takes 2 to 3 hours, and a holmium laser is used to enucleate the prostate and push this tissue into the bladder.  A morcellator is then used to remove this tissue, which is sent for pathology.  The vast majority of patients are able to discharge the same day with a catheter in place for 2 to 3 days, and will follow-up in clinic for a voiding trial.  Approximately 5% of patients will be admitted overnight to monitor the urine, or if they have multiple co-morbidities.  We specifically discussed the risks of bleeding, infection, retrograde ejaculation, temporary urgency and urge incontinence, very low risk of long-term incontinence, pathologic evaluation of prostate tissue and possible detection of prostate cancer or other malignancy, and possible need for additional procedures.   Billey Co, MD 01/24/2020

## 2020-01-24 NOTE — Transfer of Care (Signed)
Immediate Anesthesia Transfer of Care Note  Patient: Todd Burns  Procedure(s) Performed: Kristopher Glee OF THE PROSTATE WITH MORCELLATION (N/A )  Patient Location: PACU  Anesthesia Type:General  Level of Consciousness: drowsy  Airway & Oxygen Therapy: Patient Spontanous Breathing and Patient connected to face mask oxygen  Post-op Assessment: Report given to RN and Post -op Vital signs reviewed and stable  Post vital signs: Reviewed and stable  Last Vitals:  Vitals Value Taken Time  BP 140/77 01/24/20 1549  Temp    Pulse 67 01/24/20 1550  Resp 19 01/24/20 1550  SpO2 100 % 01/24/20 1550  Vitals shown include unvalidated device data.  Last Pain:  Vitals:   01/24/20 1211  TempSrc: Oral  PainSc: 0-No pain         Complications: No complications documented.

## 2020-01-24 NOTE — Op Note (Signed)
Date of procedure: 01/24/20  Preoperative diagnosis:  1. BPH with obstruction  Postoperative diagnosis:  1. Same  Procedure: 1. HoLEP (Holmium Laser Enucleation of the Prostate)  Surgeon: Nickolas Madrid, MD  Anesthesia: General  Complications: None  Intraoperative findings:  1.  Uncomplicated HOLEP, ureteral orifices very close to bladder neck 2.  Ureteral orifices and verumontanum intact at conclusion of procedure  EBL: Minimal  Specimens: Prostate chips  Enucleation time: 31 minutes  Morcellation time: 30 minutes  Intra-op weight: 60g  Drains: 24 French three-way, 60 cc in balloon  Indication: Todd Burns is a 59 y.o. patient with BPH and obstructive urinary symptoms as well as a single core of very low risk prostate cancer on recent biopsy.  After thorough discussion, he opted for HOLEP for his severe urinary symptoms refractory to Flomax and finasteride.  After reviewing the management options for treatment, they elected to proceed with the above surgical procedure(s). We have discussed the potential benefits and risks of the procedure, side effects of the proposed treatment, the likelihood of the patient achieving the goals of the procedure, and any potential problems that might occur during the procedure or recuperation.  We specifically discussed the risks of bleeding, infection, hematuria and clot retention, need for additional procedures, possible overnight hospital stay, temporary urgency and incontinence, rare long-term incontinence, and retrograde ejaculation.  Informed consent has been obtained.   Description of procedure:  The patient was taken to the operating room and general anesthesia was induced.  The patient was placed in the dorsal lithotomy position, prepped and draped in the usual sterile fashion, and preoperative antibiotics(Ancef) were administered.  SCDs were placed for DVT prophylaxis.  A preoperative time-out was performed.   Todd Burns sounds were  used to gently dilated the urethra up to 86F. The 56 French continuous flow resectoscope was inserted into the urethra using the visual obturator  The prostate was large with an elevated bladder neck and obstructing lateral lobes. The bladder was thoroughly inspected and notable for mild trabeculations and no suspicious lesions.  The ureteral orifices were located in orthotopic position, and very close to the bladder neck.  The laser was set to 2 J and 50 Hz and was used to make a lambda incision just proximal to the verumontanum down to the level of the capsule.  A 6 o'clock incision was then made down to the level of the capsule from the bladder neck to the verumontanum.  The lateral lobes were then incised circumferentially until they were disconnected from the surrounding tissue.  The capsule was examined and laser was used for meticulous hemostasis.    The 34 French resectoscope was then switched out for the 34 French nephroscope and the lobes were morcellated and the tissue sent to pathology.  A 24 French three-way catheter was inserted easily, and 60 cc were placed in the balloon.  Urine was light pink.  The catheter irrigated easily with a Toomey syringe.  CBI was initiated. A belladonna suppository was placed.  The patient tolerated the procedure well without any immediate complications and was extubated and transferred to the recovery room in stable condition.  Urine was clear on medium CBI.  Disposition: Stable to PACU  Plan: Wean CBI in PACU, anticipate discharge home today with void trial in clinic in 2-3 days  Nickolas Madrid, MD 01/24/2020

## 2020-01-24 NOTE — Anesthesia Preprocedure Evaluation (Signed)
Anesthesia Evaluation  Patient identified by MRN, date of birth, ID band Patient awake    Reviewed: Allergy & Precautions, NPO status , Patient's Chart, lab work & pertinent test results  History of Anesthesia Complications Negative for: history of anesthetic complications  Airway Mallampati: II  TM Distance: >3 FB Neck ROM: Full    Dental  (+) Poor Dentition,    Pulmonary neg sleep apnea, neg COPD, Current Smoker and Patient abstained from smoking.,    breath sounds clear to auscultation- rhonchi (-) wheezing      Cardiovascular hypertension, Pt. on medications (-) CAD, (-) Past MI, (-) Cardiac Stents and (-) CABG  Rhythm:Regular Rate:Normal - Systolic murmurs and - Diastolic murmurs Echo: 1. Severe asymetric septal hypertrophy with basal septum measuring upto  1.52mm. basal inferolateral wall 1.58mm. Findings consistent with HCM  asymetric septal variant. no evidence for systolic anterior motion of  mitral valve , no LVOT gradient. recommend  CMR for further evaluation. 3D EF performed but not reported due to  inaccurate contours.. Left ventricular ejection fraction, by estimation,  is 55 to 60%. The left ventricle has normal function. The left ventricle  has no regional wall motion  abnormalities. There is mild asymmetric left ventricular hypertrophy of  the septal segment. Left ventricular diastolic parameters are consistent  with Grade II diastolic dysfunction (pseudonormalization). The average  left ventricular global longitudinal  strain is -10.7 %. The global longitudinal strain is abnormal.  2. Right ventricular systolic function is normal. The right ventricular  size is normal.  3. Left atrial size was mildly dilated.  4. The mitral valve is normal in structure. Mild mitral valve  regurgitation. No evidence of mitral stenosis.  5. The aortic valve is normal in structure. Aortic valve regurgitation is  not  visualized. No aortic stenosis is present.  6. The inferior vena cava is normal in size with greater than 50%  respiratory variability, suggesting right atrial pressure of 3 mmHg.    Neuro/Psych neg Seizures negative neurological ROS  negative psych ROS   GI/Hepatic Neg liver ROS, GERD  ,  Endo/Other  neg diabetesHypothyroidism   Renal/GU negative Renal ROS     Musculoskeletal negative musculoskeletal ROS (+)   Abdominal (+) + obese,   Peds  Hematology negative hematology ROS (+)   Anesthesia Other Findings Past Medical History: No date: BPH (benign prostatic hyperplasia) No date: GERD (gastroesophageal reflux disease) No date: Hypertension No date: Hypothyroidism No date: LVH (left ventricular hypertrophy) No date: Pre-diabetes No date: Tear of MCL (medial collateral ligament) of knee   Reproductive/Obstetrics                             Anesthesia Physical Anesthesia Plan  ASA: III  Anesthesia Plan: General   Post-op Pain Management:    Induction: Intravenous  PONV Risk Score and Plan: 0 and Ondansetron  Airway Management Planned: Oral ETT  Additional Equipment:   Intra-op Plan:   Post-operative Plan: Extubation in OR  Informed Consent: I have reviewed the patients History and Physical, chart, labs and discussed the procedure including the risks, benefits and alternatives for the proposed anesthesia with the patient or authorized representative who has indicated his/her understanding and acceptance.     Dental advisory given  Plan Discussed with: CRNA and Anesthesiologist  Anesthesia Plan Comments:         Anesthesia Quick Evaluation

## 2020-01-24 NOTE — Anesthesia Procedure Notes (Signed)
Procedure Name: Intubation Performed by: Demetrius Charity, CRNA Pre-anesthesia Checklist: Patient identified, Patient being monitored, Timeout performed, Emergency Drugs available and Suction available Patient Re-evaluated:Patient Re-evaluated prior to induction Oxygen Delivery Method: Circle system utilized Preoxygenation: Pre-oxygenation with 100% oxygen Induction Type: IV induction Ventilation: Mask ventilation without difficulty Laryngoscope Size: 3 and McGraph Grade View: Grade I Tube type: Oral Tube size: 7.0 mm Number of attempts: 1 Airway Equipment and Method: Stylet Placement Confirmation: ETT inserted through vocal cords under direct vision,  positive ETCO2 and breath sounds checked- equal and bilateral Secured at: 23 cm Tube secured with: Tape Dental Injury: Teeth and Oropharynx as per pre-operative assessment

## 2020-01-24 NOTE — Discharge Instructions (Addendum)
Indwelling Urinary Catheter Care, Adult An indwelling urinary catheter is a thin tube that is put into your bladder. The tube helps to drain pee (urine) out of your body. The tube goes in through your urethra. Your urethra is where pee comes out of your body. Your pee will come out through the catheter, then it will go into a bag (drainage bag). Take good care of your catheter so it will work well. How to wear your catheter and bag Supplies needed  Sticky tape (adhesive tape) or a leg strap.  Alcohol wipe or soap and water (if you use tape).  A clean towel (if you use tape).  Large overnight bag.  Smaller bag (leg bag). Wearing your catheter Attach your catheter to your leg with tape or a leg strap.  Make sure the catheter is not pulled tight.  If a leg strap gets wet, take it off and put on a dry strap.  If you use tape to hold the bag on your leg: 1. Use an alcohol wipe or soap and water to wash your skin where the tape made it sticky before. 2. Use a clean towel to pat-dry that skin. 3. Use new tape to make the bag stay on your leg. Wearing your bags You should have been given a large overnight bag.  You may wear the overnight bag in the day or night.  Always have the overnight bag lower than your bladder.  Do not let the bag touch the floor.  Before you go to sleep, put a clean plastic bag in a wastebasket. Then hang the overnight bag inside the wastebasket. You should also have a smaller leg bag that fits under your clothes.  Always wear the leg bag below your knee.  Do not wear your leg bag at night. How to care for your skin and catheter Supplies needed  A clean washcloth.  Water and mild soap.  A clean towel. Caring for your skin and catheter      Clean the skin around your catheter every day: 1. Wash your hands with soap and water. 2. Wet a clean washcloth in warm water and mild soap. 3. Clean the skin around your urethra.  If you are  male:  Gently spread the folds of skin around your vagina (labia).  With the washcloth in your other hand, wipe the inner side of your labia on each side. Wipe from front to back.  If you are male:  Pull back any skin that covers the end of your penis (foreskin).  With the washcloth in your other hand, wipe your penis in small circles. Start wiping at the tip of your penis, then move away from the catheter.  Move the foreskin back in place, if needed. 4. With your free hand, hold the catheter close to where it goes into your body.  Keep holding the catheter during cleaning so it does not get pulled out. 5. With the washcloth in your other hand, clean the catheter.  Only wipe downward on the catheter.  Do not wipe upward toward your body. Doing this may push germs into your urethra and cause infection. 6. Use a clean towel to pat-dry the catheter and the skin around it. Make sure to wipe off all soap. 7. Wash your hands with soap and water.  Shower every day. Do not take baths.  Do not use cream, ointment, or lotion on the area where the catheter goes into your body, unless your doctor tells  you to.  Do not use powders, sprays, or lotions on your genital area.  Check your skin around the catheter every day for signs of infection. Check for: ? Redness, swelling, or pain. ? Fluid or blood. ? Warmth. ? Pus or a bad smell. How to empty the bag Supplies needed  Rubbing alcohol.  Gauze pad or cotton ball.  Tape or a leg strap. Emptying the bag Pour the pee out of your bag when it is ?- full, or at least 2-3 times a day. Do this for your overnight bag and your leg bag. 1. Wash your hands with soap and water. 2. Separate (detach) the bag from your leg. 3. Hold the bag over the toilet or a clean pail. Keep the bag lower than your hips and bladder. This is so the pee (urine) does not go back into the tube. 4. Open the pour spout. It is at the bottom of the bag. 5. Empty the  pee into the toilet or pail. Do not let the pour spout touch any surface. 6. Put rubbing alcohol on a gauze pad or cotton ball. 7. Use the gauze pad or cotton ball to clean the pour spout. 8. Close the pour spout. 9. Attach the bag to your leg with tape or a leg strap. 10. Wash your hands with soap and water. Follow instructions for cleaning the drainage bag:  From the product maker.  As told by your doctor. How to change the bag Supplies needed  Alcohol wipes.  A clean bag.  Tape or a leg strap. Changing the bag Replace your bag when it starts to leak, smell bad, or look dirty. 1. Wash your hands with soap and water. 2. Separate the dirty bag from your leg. 3. Pinch the catheter with your fingers so that pee does not spill out. 4. Separate the catheter tube from the bag tube where these tubes connect (at the connection valve). Do not let the tubes touch any surface. 5. Clean the end of the catheter tube with an alcohol wipe. Use a different alcohol wipe to clean the end of the bag tube. 6. Connect the catheter tube to the tube of the clean bag. 7. Attach the clean bag to your leg with tape or a leg strap. Do not make the bag tight on your leg. 8. Wash your hands with soap and water. General rules   Never pull on your catheter. Never try to take it out. Doing that can hurt you.  Always wash your hands before and after you touch your catheter or bag. Use a mild, fragrance-free soap. If you do not have soap and water, use hand sanitizer.  Always make sure there are no twists or bends (kinks) in the catheter tube.  Always make sure there are no leaks in the catheter or bag.  Drink enough fluid to keep your pee pale yellow.  Do not take baths, swim, or use a hot tub.  If you are male, wipe from front to back after you poop (have a bowel movement). Contact a doctor if:  Your pee is cloudy.  Your pee smells worse than usual.  Your catheter gets clogged.  Your catheter  leaks.  Your bladder feels full. Get help right away if:  You have redness, swelling, or pain where the catheter goes into your body.  You have fluid, blood, pus, or a bad smell coming from the area where the catheter goes into your body.  Your skin feels warm  where the catheter goes into your body.  You have a fever.  You have pain in your: ? Belly (abdomen). ? Legs. ? Lower back. ? Bladder.  You see blood in the catheter.  Your pee is pink or red.  You feel sick to your stomach (nauseous).  You throw up (vomit).  You have chills.  Your pee is not draining into the bag.  Your catheter gets pulled out. Summary  An indwelling urinary catheter is a thin tube that is placed into the bladder to help drain pee (urine) out of the body.  The catheter is placed into the part of the body that drains pee from the bladder (urethra).  Taking good care of your catheter will keep it working properly and help prevent problems.  Always wash your hands before and after touching your catheter or bag.  Never pull on your catheter or try to take it out. This information is not intended to replace advice given to you by your health care provider. Make sure you discuss any questions you have with your health care provider. Document Revised: 06/15/2018 Document Reviewed: 10/07/2016 Elsevier Patient Education  2020 [place]   1) The drugs that you were given will stay in your system until tomorrow so for the next 24 hours you should not:  A) Drive an automobile B) Make any legal decisions C) Drink any alcoholic beverage   2) You may resume regular meals tomorrow.  Today it is better to start with liquids and gradually work up to solid foods.  You may eat anything you prefer, but it is better to start with liquids, then soup and crackers, and gradually work up to solid foods.   3) Please notify your doctor immediately if  you have any unusual bleeding, trouble breathing, redness and pain at the surgery site, drainage, fever, or pain not relieved by medication.    4) Additional Instructions:        Please contact your physician with any problems or Same Day Surgery at [phone], Monday through Friday 6 am to 4 pm, or [place] at [facility] number at [phone].

## 2020-01-24 NOTE — Anesthesia Postprocedure Evaluation (Signed)
Anesthesia Post Note  Patient: Todd Burns  Procedure(s) Performed: Marylen Ponto ENUCLEATION OF THE PROSTATE WITH MORCELLATION (N/A )  Patient location during evaluation: PACU Anesthesia Type: General Level of consciousness: awake and alert Pain management: pain level controlled Vital Signs Assessment: post-procedure vital signs reviewed and stable Respiratory status: spontaneous breathing, nonlabored ventilation, respiratory function stable and patient connected to nasal cannula oxygen Cardiovascular status: blood pressure returned to baseline and stable Postop Assessment: no apparent nausea or vomiting Anesthetic complications: no   No complications documented.   Last Vitals:  Vitals:   01/24/20 1652 01/24/20 1708  BP: (!) 150/87 (!) 162/80  Pulse: 64 61  Resp: 18 16  Temp: (!) 36.4 C (!) 36 C  SpO2: 100% 100%    Last Pain:  Vitals:   01/24/20 1708  TempSrc: Temporal  PainSc: 0-No pain                 Arita Miss

## 2020-01-25 ENCOUNTER — Encounter: Payer: Self-pay | Admitting: Urology

## 2020-01-27 ENCOUNTER — Ambulatory Visit: Payer: BC Managed Care – PPO | Admitting: Physician Assistant

## 2020-01-27 ENCOUNTER — Other Ambulatory Visit: Payer: Self-pay | Admitting: *Deleted

## 2020-01-27 ENCOUNTER — Other Ambulatory Visit: Payer: Self-pay

## 2020-01-27 ENCOUNTER — Ambulatory Visit (INDEPENDENT_AMBULATORY_CARE_PROVIDER_SITE_OTHER): Payer: BC Managed Care – PPO | Admitting: Physician Assistant

## 2020-01-27 DIAGNOSIS — E782 Mixed hyperlipidemia: Secondary | ICD-10-CM

## 2020-01-27 DIAGNOSIS — E039 Hypothyroidism, unspecified: Secondary | ICD-10-CM

## 2020-01-27 DIAGNOSIS — N138 Other obstructive and reflux uropathy: Secondary | ICD-10-CM

## 2020-01-27 DIAGNOSIS — N401 Enlarged prostate with lower urinary tract symptoms: Secondary | ICD-10-CM

## 2020-01-27 NOTE — Patient Instructions (Signed)

## 2020-01-27 NOTE — Progress Notes (Signed)
Catheter Removal  Patient is present today for a catheter removal.  59ml of water was drained from the balloon. A 24FR three-way foley cath was removed from the bladder no complications were noted . Patient tolerated well.  Performed by: Debroah Loop, PA-C   Follow up/ Additional notes: Counseled patient on normal postoperative findings including dysuria, gross hematuria, and urinary leakage.  Counseled him to start Kegel exercises 3x10 sets daily to increase urinary control.  Counseled patient to return to clinic with difficulty urinating.  Will not obtain repeat PVR today in the absence of a history of urinary retention or elevated residuals.  Surgical pathology not yet resulted, will defer to Dr. Diamantina Providence to inform patient of results.

## 2020-01-28 ENCOUNTER — Other Ambulatory Visit: Payer: Self-pay

## 2020-01-28 LAB — SURGICAL PATHOLOGY

## 2020-01-29 ENCOUNTER — Other Ambulatory Visit: Payer: Self-pay | Admitting: Family Medicine

## 2020-01-29 ENCOUNTER — Telehealth: Payer: Self-pay

## 2020-01-29 DIAGNOSIS — N401 Enlarged prostate with lower urinary tract symptoms: Secondary | ICD-10-CM

## 2020-01-29 NOTE — Telephone Encounter (Signed)
Called pt informed him of the information below. Pt gave verbal understanding.  

## 2020-01-29 NOTE — Telephone Encounter (Signed)
-----   Message from Billey Co, MD sent at 01/28/2020  2:46 PM EST ----- Good news, No prostate cancer cells seen on HOLEP specimen, keep follow up as scheduled  Nickolas Madrid, MD 01/28/2020  '

## 2020-01-31 ENCOUNTER — Other Ambulatory Visit: Payer: Self-pay | Admitting: Family Medicine

## 2020-01-31 DIAGNOSIS — I10 Essential (primary) hypertension: Secondary | ICD-10-CM

## 2020-02-04 ENCOUNTER — Ambulatory Visit: Payer: Self-pay | Admitting: Family Medicine

## 2020-02-05 ENCOUNTER — Other Ambulatory Visit: Payer: BC Managed Care – PPO

## 2020-02-05 ENCOUNTER — Other Ambulatory Visit: Payer: Self-pay

## 2020-02-05 DIAGNOSIS — E039 Hypothyroidism, unspecified: Secondary | ICD-10-CM | POA: Diagnosis not present

## 2020-02-05 DIAGNOSIS — E782 Mixed hyperlipidemia: Secondary | ICD-10-CM | POA: Diagnosis not present

## 2020-02-06 ENCOUNTER — Emergency Department: Payer: BC Managed Care – PPO

## 2020-02-06 ENCOUNTER — Emergency Department
Admission: EM | Admit: 2020-02-06 | Discharge: 2020-02-06 | Disposition: A | Discharge status: Home / Self Care | Payer: BC Managed Care – PPO | Attending: Emergency Medicine | Admitting: Emergency Medicine

## 2020-02-06 ENCOUNTER — Encounter: Payer: Self-pay | Admitting: *Deleted

## 2020-02-06 DIAGNOSIS — F17210 Nicotine dependence, cigarettes, uncomplicated: Secondary | ICD-10-CM | POA: Diagnosis not present

## 2020-02-06 DIAGNOSIS — L03113 Cellulitis of right upper limb: Secondary | ICD-10-CM | POA: Diagnosis not present

## 2020-02-06 DIAGNOSIS — E039 Hypothyroidism, unspecified: Secondary | ICD-10-CM | POA: Diagnosis not present

## 2020-02-06 DIAGNOSIS — M71011 Abscess of bursa, right shoulder: Secondary | ICD-10-CM | POA: Insufficient documentation

## 2020-02-06 DIAGNOSIS — M25511 Pain in right shoulder: Secondary | ICD-10-CM | POA: Diagnosis not present

## 2020-02-06 DIAGNOSIS — Z79899 Other long term (current) drug therapy: Secondary | ICD-10-CM | POA: Insufficient documentation

## 2020-02-06 DIAGNOSIS — L0291 Cutaneous abscess, unspecified: Secondary | ICD-10-CM

## 2020-02-06 DIAGNOSIS — L02213 Cutaneous abscess of chest wall: Secondary | ICD-10-CM | POA: Diagnosis not present

## 2020-02-06 DIAGNOSIS — L02413 Cutaneous abscess of right upper limb: Secondary | ICD-10-CM | POA: Diagnosis not present

## 2020-02-06 DIAGNOSIS — I10 Essential (primary) hypertension: Secondary | ICD-10-CM | POA: Diagnosis not present

## 2020-02-06 DIAGNOSIS — L0390 Cellulitis, unspecified: Secondary | ICD-10-CM

## 2020-02-06 LAB — COMPLETE METABOLIC PANEL WITH GFR
AG Ratio: 1.1 (calc) (ref 1.0–2.5)
ALT: 19 U/L (ref 9–46)
AST: 15 U/L (ref 10–35)
Albumin: 3.6 g/dL (ref 3.6–5.1)
Alkaline phosphatase (APISO): 74 U/L (ref 35–144)
BUN: 14 mg/dL (ref 7–25)
CO2: 26 mmol/L (ref 20–32)
Calcium: 8.8 mg/dL (ref 8.6–10.3)
Chloride: 105 mmol/L (ref 98–110)
Creat: 1.2 mg/dL (ref 0.70–1.25)
GFR, Est African American: 76 mL/min/{1.73_m2} (ref >=60)
GFR, Est Non African American: 65 mL/min/{1.73_m2} (ref >=60)
Globulin: 3.4 g/dL (calc) (ref 1.9–3.7)
Glucose, Bld: 99 mg/dL (ref 65–99)
Potassium: 3.9 mmol/L (ref 3.5–5.3)
Sodium: 137 mmol/L (ref 135–146)
Total Bilirubin: 0.6 mg/dL (ref 0.2–1.2)
Total Protein: 7 g/dL (ref 6.1–8.1)

## 2020-02-06 LAB — CBC WITH DIFFERENTIAL/PLATELET
Abs Immature Granulocytes: 0.04 10*3/uL (ref 0.00–0.07)
Basophils Absolute: 0.1 10*3/uL (ref 0.0–0.1)
Basophils Relative: 1 %
Eosinophils Absolute: 0.3 10*3/uL (ref 0.0–0.5)
Eosinophils Relative: 2 %
HCT: 40.3 % (ref 39.0–52.0)
Hemoglobin: 13.5 g/dL (ref 13.0–17.0)
Immature Granulocytes: 0 %
Lymphocytes Relative: 30 %
Lymphs Abs: 3.5 10*3/uL (ref 0.7–4.0)
MCH: 27.8 pg (ref 26.0–34.0)
MCHC: 33.5 g/dL (ref 30.0–36.0)
MCV: 82.9 fL (ref 80.0–100.0)
Monocytes Absolute: 0.8 10*3/uL (ref 0.1–1.0)
Monocytes Relative: 7 %
Neutro Abs: 6.9 10*3/uL (ref 1.7–7.7)
Neutrophils Relative %: 60 %
Platelets: 249 10*3/uL (ref 150–400)
RBC: 4.86 MIL/uL (ref 4.22–5.81)
RDW: 15.6 % — ABNORMAL HIGH (ref 11.5–15.5)
WBC: 11.6 10*3/uL — ABNORMAL HIGH (ref 4.0–10.5)
nRBC: 0 % (ref 0.0–0.2)

## 2020-02-06 LAB — BASIC METABOLIC PANEL
Anion gap: 7 (ref 5–15)
BUN: 12 mg/dL (ref 6–20)
CO2: 24 mmol/L (ref 22–32)
Calcium: 8.7 mg/dL — ABNORMAL LOW (ref 8.9–10.3)
Chloride: 104 mmol/L (ref 98–111)
Creatinine, Ser: 1.15 mg/dL (ref 0.61–1.24)
GFR, Estimated: >60 mL/min (ref >60)
Glucose, Bld: 101 mg/dL — ABNORMAL HIGH (ref 70–99)
Potassium: 3.7 mmol/L (ref 3.5–5.1)
Sodium: 135 mmol/L (ref 135–145)

## 2020-02-06 LAB — LIPID PANEL
Cholesterol: 192 mg/dL (ref <200)
HDL: 34 mg/dL — ABNORMAL LOW (ref >=40)
LDL Cholesterol (Calc): 134 mg/dL (calc) — ABNORMAL HIGH
Non-HDL Cholesterol (Calc): 158 mg/dL (calc) — ABNORMAL HIGH (ref <130)
Total CHOL/HDL Ratio: 5.6 (calc) — ABNORMAL HIGH (ref <5.0)
Triglycerides: 128 mg/dL (ref <150)

## 2020-02-06 LAB — T4, FREE: Free T4: 1.1 ng/dL (ref 0.8–1.8)

## 2020-02-06 LAB — PROTIME-INR
INR: 1 (ref 0.8–1.2)
Prothrombin Time: 12.8 seconds (ref 11.4–15.2)

## 2020-02-06 LAB — TSH: TSH: 5.46 mIU/L — ABNORMAL HIGH (ref 0.40–4.50)

## 2020-02-06 LAB — APTT: aPTT: 37 seconds — ABNORMAL HIGH (ref 24–36)

## 2020-02-06 MED ORDER — IOHEXOL 300 MG/ML  SOLN
75.0000 mL | Freq: Once | INTRAMUSCULAR | Status: AC | PRN
  Administered 2020-02-06: 75 mL via INTRAVENOUS

## 2020-02-06 MED ORDER — SODIUM CHLORIDE 0.9 % IV SOLN
1.0000 g | Freq: Once | INTRAVENOUS | Status: AC
  Administered 2020-02-06: 1 g via INTRAVENOUS
  Filled 2020-02-06: qty 10

## 2020-02-06 MED ORDER — SULFAMETHOXAZOLE-TRIMETHOPRIM 800-160 MG PO TABS
1.0000 | ORAL_TABLET | Freq: Once | ORAL | Status: AC
  Administered 2020-02-06: 1 via ORAL
  Filled 2020-02-06: qty 1

## 2020-02-06 MED ORDER — CEPHALEXIN 500 MG PO CAPS
500.0000 mg | ORAL_CAPSULE | Freq: Two times a day (BID) | ORAL | 0 refills | Status: DC
Start: 2020-02-06 — End: 2020-03-04

## 2020-02-06 MED ORDER — LIDOCAINE-EPINEPHRINE 2 %-1:100000 IJ SOLN
20.0000 mL | Freq: Once | INTRAMUSCULAR | Status: AC
  Administered 2020-02-06: 20 mL via INTRADERMAL
  Filled 2020-02-06: qty 1

## 2020-02-06 MED ORDER — TRAMADOL HCL 50 MG PO TABS
50.0000 mg | ORAL_TABLET | Freq: Four times a day (QID) | ORAL | 0 refills | Status: DC | PRN
Start: 2020-02-06 — End: 2020-03-04

## 2020-02-06 MED ORDER — SULFAMETHOXAZOLE-TRIMETHOPRIM 800-160 MG PO TABS: 1.0000 | ORAL_TABLET | Freq: Two times a day (BID) | ORAL | 0 refills | Status: DC

## 2020-02-06 NOTE — ED Provider Notes (Signed)
[facility] Emergency Department Provider Note   ____________________________________________    I have reviewed the triage vital signs and the nursing notes.   HISTORY  Chief Complaint Shoulder Pain     HPI Todd Burns is a 59 y.o. male who presents with complaints of right shoulder pain.  Patient reports that pain is been ongoing for nearly a week and has worsened.  It is worse with abduction above 90.  He describes development of a erythematous cordlike extension towards the front of his shoulder which concerned him.  He has not take anything for this.  He did have urological procedure recently but no shoulder surgeries.  No history of abscesses.  No history of diabetes  Past Medical History:  Diagnosis Date  . BPH (benign prostatic hyperplasia)   . GERD (gastroesophageal reflux disease)   . Hypertension   . Hypothyroidism   . LVH (left ventricular hypertrophy)   . Pre-diabetes   . Tear of MCL (medial collateral ligament) of knee     Patient Active Problem List   Diagnosis Date Noted  . Abnormal EKG 01/22/2020  . Gastroesophageal reflux disease 07/30/2019  . Suspected sleep apnea 04/25/2018  . Obesity (BMI 30.0-34.9) 01/18/2018  . Pre-diabetes 04/15/2017  . Chronic pain of right knee 03/23/2017  . S/P rotator cuff surgery 03/23/2017  . Mixed hyperlipidemia 03/23/2017  . Essential hypertension 03/22/2017  . Hypothyroidism 03/22/2017  . BPH (benign prostatic hyperplasia) 03/22/2017    Past Surgical History:  Procedure Laterality Date  . COLONOSCOPY N/A 08/08/2014   Procedure: COLONOSCOPY;  Surgeon: Lucilla Lame, MD;  Location: [place];  Service: Gastroenterology;  Laterality: N/A;  . HOLEP-LASER ENUCLEATION OF THE PROSTATE WITH MORCELLATION N/A 01/24/2020   Procedure: HOLEP-LASER ENUCLEATION OF THE PROSTATE WITH MORCELLATION;  Surgeon: Billey Co, MD;  Location: [facility];  Service: Urology;  Laterality: N/A;  . MEDIAL  COLLATERAL LIGAMENT REPAIR, KNEE  1999  . SHOULDER ARTHROSCOPY WITH ROTATOR CUFF REPAIR Right 10/01/2014   Procedure: SHOULDER ARTHROSCOPY WITH ROTATOR CUFF REPAIR;  Surgeon: Earnestine Leys, MD;  Location: [facility];  Service: Orthopedics;  Laterality: Right;  Mini open rotator cuff repair Arthroscopy  Distal clavicle excision Subacromial decompression    Prior to Admission medications   Medication Sig Start Date End Date Taking? Authorizing Provider  amLODipine (NORVASC) 10 MG tablet Take 1 tablet (10 mg total) by mouth daily. 07/30/19   Karamalegos, Devonne Doughty, DO  cephALEXin (KEFLEX) 500 MG capsule Take 1 capsule (500 mg total) by mouth 2 (two) times daily. 02/06/20   Lavonia Drafts, MD  fluticasone (FLONASE) 50 MCG/ACT nasal spray Place 2 sprays into both nostrils daily. Use for 4-6 weeks then stop and use seasonally or as needed. Patient not taking: Reported on 01/27/2020 07/30/19   Olin Hauser, DO  levothyroxine (SYNTHROID) 175 MCG tablet Take 1 tablet (175 mcg total) by mouth daily before breakfast. 07/31/19   Parks Ranger, Devonne Doughty, DO  losartan (COZAAR) 100 MG tablet TAKE 1 TABLET BY MOUTH EVERY DAY 01/31/20   Karamalegos, Devonne Doughty, DO  montelukast (SINGULAIR) 10 MG tablet Take 1 tablet (10 mg total) by mouth at bedtime. 07/30/19   Karamalegos, Devonne Doughty, DO  naproxen (NAPROSYN) 500 MG tablet Take 500 mg by mouth 2 (two) times daily with a meal. As needed    [provider]  omeprazole (PRILOSEC) 40 MG capsule Take 1 capsule (40 mg total) by mouth daily before breakfast. 4-6 weeks then can taper down  off med 07/30/19   Olin Hauser, DO  rosuvastatin (CRESTOR) 10 MG tablet Take 1 tablet (10 mg total) by mouth at bedtime. 11/05/19   Karamalegos, Devonne Doughty, DO  sildenafil (REVATIO) 20 MG tablet Take 1-5 pills about 60 min prior to sex. Start with 1 and increase as needed. 11/20/19   Billey Co, MD  sulfamethoxazole-trimethoprim (BACTRIM DS) 800-160 MG  tablet Take 1 tablet by mouth 2 (two) times daily. 02/06/20   Lavonia Drafts, MD  traMADol (ULTRAM) 50 MG tablet Take 1 tablet (50 mg total) by mouth every 6 (six) hours as needed. 02/06/20 02/05/21  Lavonia Drafts, MD  triamcinolone cream (KENALOG) 0.1 % Apply 1 application topically 2 (two) times daily. As needed for bug bites or itching 07/30/19   Olin Hauser, DO     Allergies Lisinopril  Family History  Problem Relation Age of Onset  . Cancer Mother        thyroid  . Cancer Father        lung  . Thyroid disease Brother   . Cancer Maternal Aunt        thyroid  . Cancer Sister   . Thyroid disease Brother   . Cancer Sister     Social History Social History   Tobacco Use  . Smoking status: Current Every Day Smoker    Packs/day: 1.00    Years: 15.00    Pack years: 15.00    Types: Cigarettes  . Smokeless tobacco: Never Used  Vaping Use  . Vaping Use: Former  Substance Use Topics  . Alcohol use: Not Currently    Comment: Drinks every month or two  . Drug use: No    Review of Systems  Constitutional: No fever/chills Eyes: No visual changes.  ENT: No sore throat. Cardiovascular: Denies chest pain. Respiratory: Denies shortness of breath. Gastrointestinal: No abdominal pain.   Genitourinary: Negative for dysuria. Musculoskeletal: As above Skin: As above Neurological: Negative for headaches or weakness   ____________________________________________   PHYSICAL EXAM:  VITAL SIGNS: ED Triage Vitals  Enc Vitals Group     BP 02/06/20 0215 (!) 198/102     Pulse Rate 02/06/20 0215 73     Resp 02/06/20 0215 16     Temp 02/06/20 0239 98.7 F (37.1 C)     Temp Source 02/06/20 0239 Oral     SpO2 02/06/20 0215 97 %     Weight 02/06/20 0215 96 kg (211 lb 10.3 oz)     Height 02/06/20 0215 1.702 m (5\' 7" )     Head Circumference --      Peak Flow --      Pain Score 02/06/20 0215 5     Pain Loc --      Pain Edu? --      Excl. in [place]? --      Constitutional: Alert and oriented. No acute distress.  Nose: No congestion/rhinnorhea. Mouth/Throat: Mucous membranes are moist.   Neck:  Painless ROM Cardiovascular: Normal rate, regular rhythm. Grossly normal heart sounds.  Good peripheral circulation. Respiratory: Normal respiratory effort.  No retractions. Lungs CTAB. Gastrointestinal: Soft and nontender. No distention.  No CVA tenderness. Genitourinary: deferred Musculoskeletal: Right shoulder: Indurated approximately 3 x 3 raised area just appear to the acromion, no drainage, tender to palpation.  Erythematous cordlike superficial structure stretching down towards armpit Neurologic:  Normal speech and language. No gross focal neurologic deficits are appreciated.  Skin:  Skin is warm, dry and intact.  Psychiatric: Mood  and affect are normal. Speech and behavior are normal.  ____________________________________________   LABS (all labs ordered are listed, but only abnormal results are displayed)  Labs Reviewed  CBC WITH DIFFERENTIAL/PLATELET - Abnormal; Notable for the following components:      Result Value   WBC 11.6 (*)    RDW 15.6 (*)    All other components within normal limits  APTT - Abnormal; Notable for the following components:   aPTT 37 (*)    All other components within normal limits  BASIC METABOLIC PANEL - Abnormal; Notable for the following components:   Glucose, Bld 101 (*)    Calcium 8.7 (*)    All other components within normal limits  PROTIME-INR   ____________________________________________  EKG  None ____________________________________________  RADIOLOGY  CT chest reviewed by me, demonstrates subcutaneous abscess superior to the right acromion with inflammatory change ____________________________________________   PROCEDURES  Procedure(s) performed: yes  .Marland KitchenIncision and Drainage  Date/Time: 02/06/2020 9:44 AM Performed by: Lavonia Drafts, MD Authorized by: Lavonia Drafts, MD    Consent:    Consent obtained:  Verbal   Consent given by:  Patient   Risks discussed:  Bleeding, infection, incomplete drainage and pain   Alternatives discussed:  Alternative treatment, delayed treatment and observation Location:    Type:  Abscess Anesthesia (see MAR for exact dosages):    Anesthesia method:  Local infiltration   Local anesthetic:  Lidocaine 1% WITH epi Procedure type:    Complexity:  Complex Procedure details:    Incision types:  Single straight   Incision depth:  Subcutaneous   Scalpel blade:  11   Wound management:  Probed and deloculated   Drainage:  Purulent   Drainage amount:  Moderate   Wound treatment:  Wound left open   Packing materials:  1/4 in iodoform gauze Post-procedure details:    Patient tolerance of procedure:  Tolerated well, no immediate complications     Critical Care performed: No ____________________________________________   INITIAL IMPRESSION / ASSESSMENT AND PLAN / ED COURSE  Pertinent labs & imaging results that were available during my care of the patient were reviewed by me and considered in my medical decision making (see chart for details).  Patient presents with shoulder pain as described above.  CT reviewed and demonstrates subcutaneous abscess which is likely the cause of the surrounding inflammation and erythema.  Mildly elevated white blood cell count but patient is afebrile and overall well-appearing.  Will I&D abscess, will give IV Rocephin, p.o. Bactrim  Patient will need close follow-up    ____________________________________________   FINAL CLINICAL IMPRESSION(S) / ED DIAGNOSES  Final diagnoses:  Abscess  Cellulitis, unspecified cellulitis site        Note:  This document was prepared using Dragon voice recognition software and may include unintentional dictation errors.   Lavonia Drafts, MD 02/06/20 [phone]

## 2020-02-06 NOTE — ED Notes (Signed)
Sub wait explained to patient and call bell in reach. NAD noted at this time.

## 2020-02-06 NOTE — ED Triage Notes (Signed)
Pt to ED reporting right shoulder pain that has kept him awake this evening. Redness noted to a specific area with swelling and slight warmth. PT also has a distinct line in his shoulder that pt does not know a cause for. Pain radiates down arm.   Pt had surgery 2 weeks ago and was sedated for a prostate exam but was never admitted to hospital.   MD at bedside.

## 2020-02-06 NOTE — ED Triage Notes (Signed)
Emergency Medicine Provider Triage Evaluation Note  Todd Burns , a 59 y.o. male  was evaluated in triage.  Pt complains of right shoulder pain with a palpable lump.  Patient reports having prostate surgery about 2 weeks ago.  Since that time he has had a red and tender firm lump on the top of his right shoulder.  Over the last 24 hours he discovered that he has a firm palpable and tender line that extends from the lump on the right shoulder anteriorly and into his axilla.  He denies any traumatic injury.  He denies any drainage.  Denies fever/chills, sore throat, chest pain, shortness of breath, nausea, vomiting, and abdominal pain.  He had surgery years ago on the right shoulder but no recent procedure.  Review of Systems  Positive: Right shoulder pain, "red painful lump", palpable line of tenderness extending into the right axilla. Negative: Chest pain, shortness of breath, nausea, vomiting, abdominal pain, fever.  Physical Exam  BP (!) 198/102 (BP Location: Left Arm)   Pulse 73   Resp 16   Ht 1.702 m (5\' 7" )   Wt 96 kg   SpO2 97%   BMI 33.15 kg/m  Gen:   Awake, no distress   HEENT:  Atraumatic  Resp:  Normal effort  Cardiac:  Normal rate  Abd:   Nondistended, nontender  MSK:   Moves extremities without difficulty.  Patient has an indurated lump on the top of his right shoulder that is erythematous and several centimeters in diameter.  There is no fluctuance nor drainage.  He then has a palpable indurated line that extends anteriorly over the shoulder and into the axilla.  There is some scattered erythema but is not clearly a line of cellulitis.  The line feels continuous and not "bumpy" as a reactive lymphadenitis might feel. Neuro:  Speech clear   Medical Decision Making  Medically screening exam initiated at 2:28 AM.  Appropriate orders placed.  Todd Burns was informed that the remainder of the evaluation will be completed by another provider, this initial triage assessment  does not replace that evaluation, and the importance of remaining in the ED until their evaluation is complete.  Clinical Impression  Abscess with spreading line of infection versus venous blood clot.  Patient is medically stable in spite of hypertension.  I doubt the benefit of CTA nor venous ultrasound given the location.  Ordering basic labs including coagulation studies (the patient reports not being on any anticoagulation) as well as CT chest with IV contrast to be obtained after the patient's creatinine results are back.   Hinda Kehr, MD 02/06/20 [phone]

## 2020-02-10 ENCOUNTER — Ambulatory Visit: Payer: Self-pay | Admitting: *Deleted

## 2020-02-10 ENCOUNTER — Telehealth: Payer: Self-pay

## 2020-02-10 NOTE — Telephone Encounter (Signed)
  No assessment protocol used. Additional Information . Commented on: Answer Assessment - Initial Assessment Questions    I returned pt's call however he spoke with someone in the office a few minutes ago.   They have him coming in on Wed. I asked if he had any questions or concerns pertaining to the wound or dressing and he said,  "No".   "They just told me to come in on Wednesday". I let him know to call back if any further questions or issues.  He was agreeable.  Protocols used: POST-OP INCISION SYMPTOMS AND QUESTIONS-A-AH

## 2020-02-10 NOTE — Telephone Encounter (Signed)
He is cleared to return to work from my perspective  Nickolas Madrid, MD 02/10/2020

## 2020-02-10 NOTE — Telephone Encounter (Signed)
Incoming call on triage line from patient in regards to a work note following his surgery. Patient states his job needs a release date/note before they will allow him back at work. Patient was unsure when he could go back to work. Please advise.

## 2020-02-11 NOTE — Telephone Encounter (Signed)
Called pt, no answer. Left detailed message for pt informing him that he is cleared to return to work, we just need to know what date he intends to return for letter.

## 2020-02-12 ENCOUNTER — Other Ambulatory Visit: Payer: Self-pay

## 2020-02-12 ENCOUNTER — Ambulatory Visit (INDEPENDENT_AMBULATORY_CARE_PROVIDER_SITE_OTHER): Payer: BC Managed Care – PPO | Admitting: Family Medicine

## 2020-02-12 ENCOUNTER — Encounter: Payer: Self-pay | Admitting: Family Medicine

## 2020-02-12 VITALS — BP 148/63 | HR 62 | Temp 97.3°F | Resp 16 | Ht 67.0 in | Wt 214.0 lb

## 2020-02-12 DIAGNOSIS — L02413 Cutaneous abscess of right upper limb: Secondary | ICD-10-CM | POA: Diagnosis not present

## 2020-02-12 MED ORDER — MUPIROCIN 2 % EX OINT: 1.0000 "application " | TOPICAL_OINTMENT | Freq: Two times a day (BID) | CUTANEOUS | 0 refills | Status: DC

## 2020-02-12 NOTE — Progress Notes (Signed)
Subjective:    Patient ID: [name], male    DOB: [birth year], 59 y.o.   MRN: [id number]  ANITA MCADORY is a 59 y.o. male presenting on 02/12/2020 for Hospitalization Follow-up ( I&D abscess in hospital--as per patient still has little Purulent Drainage)   HPI  ED FOLLOW-UP VISIT  Hospital/Location: [place] Date of ED Visit: 02/06/20  Reason for Presenting to ED: Right Shoulder Abscess  FOLLOW-UP  - ED provider note and record have been reviewed - Patient presents today about 6 days after recent ED visit. Brief summary of recent course, patient had symptoms of abscess R shoulder developed unsure cause,  presented to ED on 02/06/20, with labs and CT imaging, and received I&D at that time, IV Ceftriaxone and DC'd on Keflex and Bactrim-DS for 7 more day oral antibiotic therapy, he had wound packing, he had removed the packing now about 3 days ago, here for wound chec  - Today reports overall has done well after discharge from ED. Symptoms of Abscess have improved, no spreading redness, still has bruising.  - New medications on discharge: Keflex, Bactrim-DS - Changes to current meds on discharge: none  I have reviewed the discharge medication list, and have reconciled the current and discharge medications today.   Health Maintenance: Due Flu Shot when ready.  Depression screen [facility] 2/9 11/05/2019 08/02/2018 10/10/2017  Decreased Interest 0 0 0  Down, Depressed, Hopeless 0 0 0  PHQ - 2 Score 0 0 0    Social History   Tobacco Use  . Smoking status: Current Every Day Smoker    Packs/day: 1.00    Years: 15.00    Pack years: 15.00    Types: Cigarettes  . Smokeless tobacco: Never Used  Vaping Use  . Vaping Use: Former  Substance Use Topics  . Alcohol use: Not Currently    Comment: Drinks every month or two  . Drug use: No    Review of Systems Per HPI unless specifically indicated above     Objective:    BP (!) 148/63   Pulse 62   Temp (!) 97.3 F (36.3 C) (Temporal)    Resp 16   Ht 5\' 7"  (1.702 m)   Wt 214 lb (97.1 kg)   SpO2 100%   BMI 33.52 kg/m   Wt Readings from Last 3 Encounters:  02/12/20 214 lb (97.1 kg)  02/06/20 211 lb 10.3 oz (96 kg)  01/24/20 211 lb 10.3 oz (96 kg)    Physical Exam Vitals and nursing note reviewed.  Constitutional:      General: He is not in acute distress.    Appearance: He is well-developed. He is not diaphoretic.     Comments: Well-appearing, comfortable, cooperative  HENT:     Head: Normocephalic and atraumatic.  Eyes:     General:        Right eye: No discharge.        Left eye: No discharge.     Conjunctiva/sclera: Conjunctivae normal.  Cardiovascular:     Rate and Rhythm: Normal rate.  Pulmonary:     Effort: Pulmonary effort is normal.  Skin:    General: Skin is warm and dry.     Findings: Lesion (R shoulder anterior see picture, 1 x 1 area minor opening still healing, no drainage no induration, no erythema extending.) present. No erythema or rash.  Neurological:     Mental Status: He is alert and oriented to person, place, and time.  Psychiatric:  Behavior: Behavior normal.     Comments: Well groomed, good eye contact, normal speech and thoughts          I have personally reviewed the radiology report from CT on 02/06/20.  CT Chest W ContrastPerformed 02/06/2020 Final result  Study Result CLINICAL DATA: Right chest wall mass  EXAM: CT CHEST WITH CONTRAST  TECHNIQUE: Multidetector CT imaging of the chest was performed during intravenous contrast administration.  CONTRAST: 43mL OMNIPAQUE IOHEXOL 300 MG/ML SOLN  COMPARISON: None.  FINDINGS: Cardiovascular: Mild coronary artery calcification. Marked left ventricular hypertrophy. Global cardiac size within normal limits. Trace pericardial fluid is likely physiologic. The central pulmonary arteries are of normal caliber. Minimal atherosclerotic calcification within the thoracic aorta. No aortic aneurysm.  Mediastinum/Nodes: The  thyroid gland is not well visualized suggesting marked atrophy or thyroidectomy. No pathologic thoracic adenopathy. The esophagus is unremarkable.  Lungs/Pleura: Lungs are clear. No pleural effusion or pneumothorax.  Upper Abdomen: No acute abnormality.  Musculoskeletal: A 1.6 x 1.5 cm rim enhancing fluid collection is seen within the subcutaneous fat of the right shoulder superficial to the right acromion demonstrating a small sinus tract to the skin, best seen on image # 61/5. There is associated dermal thickening and surrounding subcutaneous edema in keeping with an inflammatory process. Infiltrative change related to edema extends inferiorly within the subcutaneous fat superficial to the pectoralis anteriorly and trapezius and deltoid musculature posteriorly. The subjacent acromion is unremarkable. No acute bone abnormality.  IMPRESSION: Subcutaneous abscess within the right shoulder superficial to the acromion with extensive surrounding inflammatory change extending inferiorly superficial to the pectoralis, trapezius and deltoid musculature.  Marked left ventricular hypertrophy. Mild coronary artery calcification.  Probable marked atrophy of the thyroid gland. Correlation with thyroid function tests may be helpful.  Aortic Atherosclerosis (ICD10-I70.0).   Results for orders placed or performed during the hospital encounter of 02/06/20  CBC with Differential/Platelet  Result Value Ref Range   WBC 11.6 (H) 4.0 - 10.5 K/uL   RBC 4.86 4.22 - 5.81 MIL/uL   Hemoglobin 13.5 13.0 - 17.0 g/dL   HCT 40.3 39 - 52 %   MCV 82.9 80.0 - 100.0 fL   MCH 27.8 26.0 - 34.0 pg   MCHC 33.5 30.0 - 36.0 g/dL   RDW 15.6 (H) 11.5 - 15.5 %   Platelets 249 150 - 400 K/uL   nRBC 0.0 0.0 - 0.2 %   Neutrophils Relative % 60 %   Neutro Abs 6.9 1.7 - 7.7 K/uL   Lymphocytes Relative 30 %   Lymphs Abs 3.5 0.7 - 4.0 K/uL   Monocytes Relative 7 %   Monocytes Absolute 0.8 0.1 - 1.0 K/uL    Eosinophils Relative 2 %   Eosinophils Absolute 0.3 0.0 - 0.5 K/uL   Basophils Relative 1 %   Basophils Absolute 0.1 0.0 - 0.1 K/uL   Immature Granulocytes 0 %   Abs Immature Granulocytes 0.04 0.00 - 0.07 K/uL  Protime-INR  Result Value Ref Range   Prothrombin Time 12.8 11.4 - 15.2 seconds   INR 1.0 0.8 - 1.2  APTT  Result Value Ref Range   aPTT 37 (H) 24 - 36 seconds  Basic metabolic panel  Result Value Ref Range   Sodium 135 135 - 145 mmol/L   Potassium 3.7 3.5 - 5.1 mmol/L   Chloride 104 98 - 111 mmol/L   CO2 24 22 - 32 mmol/L   Glucose, Bld 101 (H) 70 - 99 mg/dL   BUN 12 6 -  20 mg/dL   Creatinine, Ser 1.15 0.61 - 1.24 mg/dL   Calcium 8.7 (L) 8.9 - 10.3 mg/dL   GFR, Estimated >60 >60 mL/min   Anion gap 7 5 - 15      Assessment & Plan:   Problem List Items Addressed This Visit    None    Visit Diagnoses    Abscess of skin of right shoulder    -  Primary   Relevant Medications   mupirocin ointment (BACTROBAN) 2 %      Consistent with resolving R anterior shoulder superficial skin abscess ED visit treated IV ceftriaxone CT imaging reviewed No other complication Resolving now on oral antibiotics 6 days completed Wound packing is already out No further drainage No additional antibiotic today, continue to heal  Add topical mupirocin to help resolve infection and help prevent recurrence.  Return precautions given if worsening   Meds ordered this encounter  Medications  . mupirocin ointment (BACTROBAN) 2 %    Sig: Apply 1 application topically 2 (two) times daily. For 1-2 weeks until fully healed    Dispense:  22 g    Refill:  0      Follow up plan: Return if symptoms worsen or fail to improve.   [name], DO [facility] 02/12/2020, 11:19 AM

## 2020-02-12 NOTE — Patient Instructions (Addendum)
Thank you for coming to the office today.  Finish current antibiotics.  If after 48 hours, off antibiotic you feel it is worsening, more pain swelling pus redness or fever - message or call and we will extend antibiotics by another 3-5 days.  Keep it open to air if just relaxing, if more vigorous activity can cover with gauze.  Use Mupirocin ointment antibiotic 2 times a day for 1-2 weeks to finish healing. If it is open and draining, don't use the ointment.   Please schedule a Follow-up Appointment to: Return if symptoms worsen or fail to improve.  If you have any other questions or concerns, please feel free to call the office or send a message through [place]. You may also schedule an earlier appointment if necessary.  Additionally, you may be receiving a survey about your experience at our office within a few days to 1 week by e-mail or mail. We value your feedback.  [name], DO [place]

## 2020-02-13 ENCOUNTER — Encounter: Payer: Self-pay | Admitting: Urology

## 2020-03-03 NOTE — Progress Notes (Signed)
Follow-up Outpatient Visit Date: 03/04/2020  Primary Care Provider: Olin Hauser, DO [address] [id number]  Chief Complaint: Follow-up hypertension and cardiomyopathy  HPI:  Todd Burns is a 59 y.o. male with history of hypertension, prediabetes, hypothyroidism, and BPH, who presents for follow-up of abnormal EKG and hypertension.  I met him for preoperative evaluation in anticipation of TURP last month.  Subsequent echo showed severe asymmetric hypertrophy of the septum concerning for hypertrophic cardiomyopathy.  LVEF was 55-60% with grade 2 diastolic dysfunction.  He was diagnosed with a right shoulder abscess on 02/06/2020.  Marked LVH and mild coronary artery calcification was also noted.  Today, Todd Burns reports that he has been feeling well, denying chest pain, shortness of breath, palpitations, lightheadedness, and edema.  He recently developed a right shoulder abscess for unclear reasons.  This was drained in the emergency department and has resolved.  He notes that his blood pressure remains consistently elevated despite being compliant with his blood pressure medications.  He has been trying to follow a low-sodium diet.  --------------------------------------------------------------------------------------------------  Cardiovascular History & Procedures: Cardiovascular Problems:  Abnormal EKG  Risk Factors:  Hypertension, male gender, obesity, age > 48, and tobacco use  Cath/PCI:  None  CV Surgery:  None  EP Procedures and Devices:  None  Non-Invasive Evaluation(s):  TTE (01/23/2020): Severe asymmetric septal hypertrophy of the left ventricle.  LVEF 55-60% with grade 2 diastolic dysfunction.  GLS -10.7%.  Mild left atrial enlargement.  Mild mitral regurgitation.  Normal RV size and function.   Recent CV Pertinent Labs: Lab Results  Component Value Date   CHOL 192 02/05/2020   HDL 34 (L) 02/05/2020   LDLCALC 134 (H) 02/05/2020    TRIG 128 02/05/2020   CHOLHDL 5.6 (H) 02/05/2020   INR 1.0 02/06/2020   K 3.7 02/06/2020   BUN 12 02/06/2020   CREATININE 1.15 02/06/2020   CREATININE 1.20 02/05/2020    Past medical and surgical history were reviewed and updated in EPIC.  Current Meds  Medication Sig  . amLODipine (NORVASC) 10 MG tablet Take 1 tablet (10 mg total) by mouth daily.  . fluticasone (FLONASE) 50 MCG/ACT nasal spray Place 2 sprays into both nostrils daily. Use for 4-6 weeks then stop and use seasonally or as needed.  Marland Kitchen levothyroxine (SYNTHROID) 175 MCG tablet Take 1 tablet (175 mcg total) by mouth daily before breakfast.  . losartan (COZAAR) 100 MG tablet TAKE 1 TABLET BY MOUTH EVERY DAY  . mupirocin ointment (BACTROBAN) 2 % Apply 1 application topically 2 (two) times daily. For 1-2 weeks until fully healed  . naproxen (NAPROSYN) 500 MG tablet Take 500 mg by mouth 2 (two) times daily with a meal. As needed  . omeprazole (PRILOSEC) 40 MG capsule Take 1 capsule (40 mg total) by mouth daily before breakfast. 4-6 weeks then can taper down off med  . rosuvastatin (CRESTOR) 10 MG tablet Take 1 tablet (10 mg total) by mouth at bedtime.  . sildenafil (REVATIO) 20 MG tablet Take 1-5 pills about 60 min prior to sex. Start with 1 and increase as needed.  . triamcinolone cream (KENALOG) 0.1 % Apply 1 application topically 2 (two) times daily. As needed for bug bites or itching    Allergies: Lisinopril  Social History   Tobacco Use  . Smoking status: Current Every Day Smoker    Packs/day: 1.00    Years: 15.00    Pack years: 15.00    Types: Cigarettes  .  Smokeless tobacco: Never Used  Vaping Use  . Vaping Use: Former  Substance Use Topics  . Alcohol use: Not Currently    Comment: Drinks every month or two  . Drug use: No    Family History  Problem Relation Age of Onset  . Cancer Mother        thyroid  . Cancer Father        lung  . Thyroid disease Brother   . Cancer Maternal Aunt        thyroid  .  Cancer Sister   . Thyroid disease Brother   . Cancer Sister     Review of Systems: A 12-system review of systems was performed and was negative except as noted in the HPI.  --------------------------------------------------------------------------------------------------  Physical Exam: BP (!) 160/74 (BP Location: Left Arm, Patient Position: Sitting, Cuff Size: Normal)   Pulse (!) 57   Ht _0  (1.702 m)   Wt 214 lb (97.1 kg)   SpO2 95%   BMI 33.52 kg/m   General: NAD. Neck: No JVD or HJR. Lungs: Clear to auscultation bilaterally without wheezes or crackles. Heart: Regular rate and rhythm without murmurs, rubs, or gallops. Abdomen: Soft, nontender, nondistended without hepatosplenomegaly. Extremities: No lower extremity edema.  EKG: Normal sinus rhythm with LVH and abnormal repolarization.  No significant change from prior tracing on 01/22/2020.  Lab Results  Component Value Date   WBC 11.6 (H) 02/06/2020   HGB 13.5 02/06/2020   HCT 40.3 02/06/2020   MCV 82.9 02/06/2020   PLT 249 02/06/2020    Lab Results  Component Value Date   NA 135 02/06/2020   K 3.7 02/06/2020   CL 104 02/06/2020   CO2 24 02/06/2020   BUN 12 02/06/2020   CREATININE 1.15 02/06/2020   GLUCOSE 101 (H) 02/06/2020   ALT 19 02/05/2020    Lab Results  Component Value Date   CHOL 192 02/05/2020   HDL 34 (L) 02/05/2020   LDLCALC 134 (H) 02/05/2020   TRIG 128 02/05/2020   CHOLHDL 5.6 (H) 02/05/2020    --------------------------------------------------------------------------------------------------  ASSESSMENT AND PLAN: Hypertrophic cardiomyopathy: EKG and recent echocardiogram concerning for hypertrophic cardiomyopathy with asymmetric hypertrophy of the septum.  Todd Burns is asymptomatic.  I have recommended that we obtain a cardiac MRI for further evaluation, as his significant hypertension places him at risk for hypertensive heart disease that could mimic HCM, though the degree of LVH and  is asymmetric nature on echo argues more for HCM.  Given resting heart rate in the upper 50s today, addition of a beta-blocker is precluded.  Uncontrolled hypertension: Blood pressure still significantly elevated today.  Todd Burns reports being compliant with his medications as well as a low-sodium diet.  We will check a BMP today.  If his potassium allows, we will add spironolactone 25 mg daily.  Repeat BMP in 1 week will be necessary to ensure stable renal function and electrolytes.  If blood pressure remains significantly elevated, secondary hypertension work-up will need to be pursued.  Follow-up: Return to clinic in 6 weeks.  Nelva Bush, MD 03/04/2020 9:53 AM

## 2020-03-04 ENCOUNTER — Other Ambulatory Visit: Payer: Self-pay | Admitting: *Deleted

## 2020-03-04 ENCOUNTER — Ambulatory Visit (INDEPENDENT_AMBULATORY_CARE_PROVIDER_SITE_OTHER): Payer: BC Managed Care – PPO | Admitting: Internal Medicine

## 2020-03-04 ENCOUNTER — Telehealth: Payer: Self-pay | Admitting: Internal Medicine

## 2020-03-04 ENCOUNTER — Encounter: Payer: Self-pay | Admitting: Urology

## 2020-03-04 ENCOUNTER — Other Ambulatory Visit: Payer: BC Managed Care – PPO

## 2020-03-04 ENCOUNTER — Encounter: Payer: Self-pay | Admitting: Internal Medicine

## 2020-03-04 ENCOUNTER — Other Ambulatory Visit: Payer: Self-pay

## 2020-03-04 VITALS — BP 160/74 | HR 57 | Ht 67.0 in | Wt 214.0 lb

## 2020-03-04 DIAGNOSIS — Z79899 Other long term (current) drug therapy: Secondary | ICD-10-CM

## 2020-03-04 DIAGNOSIS — C61 Malignant neoplasm of prostate: Secondary | ICD-10-CM

## 2020-03-04 DIAGNOSIS — I422 Other hypertrophic cardiomyopathy: Secondary | ICD-10-CM

## 2020-03-04 DIAGNOSIS — I10 Essential (primary) hypertension: Secondary | ICD-10-CM | POA: Diagnosis not present

## 2020-03-04 MED ORDER — SPIRONOLACTONE 25 MG PO TABS: 25.0000 mg | ORAL_TABLET | Freq: Every day | ORAL | 1 refills | Status: DC

## 2020-03-04 NOTE — Telephone Encounter (Signed)
Left message for patient to call and discuss scheduling the Cardiac MRI ordered by Dr. End 

## 2020-03-04 NOTE — Patient Instructions (Signed)
Medication Instructions:   Your physician has recommended you make the following change in your medication:   START Spironolactone 25mg  - Take 1 tablet daily   An Rx has been sent to your pharmacy  *If you need a refill on your cardiac medications before your next appointment, please call your pharmacy*   Lab Work:  Your physician recommends that you have lab work TODAY in clinic: [place] your physician recommends that you return for lab work in: [place] at the [place]   -  Please go to the [facility]. You will check in at the front desk to the right as you walk into the atrium. Valet Parking is offered if needed. - No appointment needed. You may go any day between 7 am and 6 pm.   If you have labs (blood work) drawn today and your tests are completely normal, you will receive your results only by: Marland Kitchen MyChart Message (if you have MyChart) OR . A paper copy in the mail If you have any lab test that is abnormal or we need to change your treatment, we will call you to review the results.   Testing/Procedures:  You are scheduled for Cardiac MRI on _______________. Please arrive at the [facility] main entrance of [facility] at ___________(30-45 minutes prior to test start time). ?  [facility]  [address]  [place], [place] [address]  [phone]  Proceed to the [facility] Radiology Department (First Floor).  ?  Magnetic resonance imaging (MRI) is a painless test that produces images of the inside of the body without using X-rays. During an MRI, strong magnets and radio waves work together in a [employer] officer, political party to form detailed images. MRI images may provide more details about a medical condition than X-rays, CT scans, and ultrasounds can provide.  You may be given earphones to listen for instructions.  You may eat a light breakfast and take medications as ordered with the exception of Spironolactone (fluid pill). If a contrast material  will be used, an IV will be inserted into one of your veins. Contrast material will be injected into your IV.  You will be asked to remove all metal, including: Watch, jewelry, and other metal objects including hearing aids, hair pieces and dentures. (Braces and fillings normally are not a problem.)  If contrast material was used:  It will leave your body through your urine within a day. You may be told to drink plenty of fluids to help flush the contrast material out of your system.  TEST WILL TAKE APPROXIMATELY 1 HOUR  PLEASE NOTIFY SCHEDULING AT LEAST 24 HOURS IN ADVANCE IF YOU ARE UNABLE TO KEEP YOUR APPOINTMENT.      Follow-Up: At [facility], you and your health needs are our priority.  As part of our continuing mission to provide you with exceptional heart care, we have created designated Provider Care Teams.  These Care Teams include your primary Cardiologist (physician) and Advanced Practice Providers (APPs -  Physician Assistants and Nurse Practitioners) who all work together to provide you with the care you need, when you need it.  We recommend signing up for the patient portal called "MyChart".  Sign up information is provided on this After Visit Summary.  MyChart is used to connect with patients for Virtual Visits (Telemedicine).  Patients are able to view lab/test results, encounter notes, upcoming appointments, etc.  Non-urgent messages can be sent to your provider as well.  To learn more about what you can do with MyChart, go to [email or web address].    Your next appointment:   6 week(s)  The format for your next appointment:   In Person  Provider:   You may see Dr. Okey Dupre or one of the following Advanced Practice Providers on your designated Care Team:    Nicolasa Ducking, NP  Eula Listen, PA-C  Marisue Ivan, PA-C  Cadence Hoven, [place]  Gillian Shields, NP

## 2020-03-05 ENCOUNTER — Telehealth: Payer: Self-pay | Admitting: *Deleted

## 2020-03-05 ENCOUNTER — Encounter: Payer: Self-pay | Admitting: Internal Medicine

## 2020-03-05 DIAGNOSIS — I422 Other hypertrophic cardiomyopathy: Secondary | ICD-10-CM

## 2020-03-05 LAB — BASIC METABOLIC PANEL
BUN/Creatinine Ratio: 11 (ref 10–24)
BUN: 13 mg/dL (ref 8–27)
CO2: 20 mmol/L (ref 20–29)
Calcium: 9.1 mg/dL (ref 8.6–10.2)
Chloride: 106 mmol/L (ref 96–106)
Creatinine, Ser: 1.14 mg/dL (ref 0.76–1.27)
GFR calc Af Amer: 80 mL/min/{1.73_m2} (ref >59)
GFR calc non Af Amer: 70 mL/min/{1.73_m2} (ref >59)
Glucose: 87 mg/dL (ref 65–99)
Potassium: 4.1 mmol/L (ref 3.5–5.2)
Sodium: 139 mmol/L (ref 134–144)

## 2020-03-05 NOTE — Telephone Encounter (Signed)
Left detailed message on pt's answering service (DPR approved), advised DrMarland Kitchen Serita Kyle notes  "Please let Mr. Routzahn know that his kidney function and electrolytes are stable. He should go ahead with addition of spironolactone 25 mg daily. He will need a repeat BMP in 1 week, as discussed yesterday."  Dr. Okey Dupre has placed order for BMP next week and spironolactone 25 mg has been sent to pharmacy. Left message that labs can be drawn at [facility] medical mall and medication can be picked up at his [facility].

## 2020-03-05 NOTE — Telephone Encounter (Signed)
Left voicemail message to please call back regarding some results and recommendations.

## 2020-03-05 NOTE — Telephone Encounter (Signed)
-----   Message from Christopher End, MD sent at 03/05/2020  8:16 AM EST ----- Please let Todd Burns know that his kidney function and electrolytes are stable. He should go ahead with addition of spironolactone 25 mg daily. He will need a repeat BMP in 1 week, as discussed yesterday. 

## 2020-03-05 NOTE — Telephone Encounter (Signed)
Left voicemail message to call back regarding results. 

## 2020-03-05 NOTE — Telephone Encounter (Signed)
-----   Message from Yvonne Kendall, MD sent at 03/05/2020  8:16 AM EST ----- Please let Todd Burns know that his kidney function and electrolytes are stable. He should go ahead with addition of spironolactone 25 mg daily. He will need a repeat BMP in 1 week, as discussed yesterday.

## 2020-03-09 ENCOUNTER — Telehealth: Payer: Self-pay | Admitting: Internal Medicine

## 2020-03-09 NOTE — Telephone Encounter (Signed)
Spoke with patients wife per release form and reviewed results and recommendations. He has been taking spironolactone and going for labs this week on Wednesday. Confirmed upcoming appointment in February and advised that once labs result we will call if any further recommendations. She states he is at work and not able to check his phone and would provide him with this information. She verbalized understanding of our conversation with no further questions at this time.

## 2020-03-09 NOTE — Telephone Encounter (Signed)
Left voicemail message to call back for results and recommendations. 

## 2020-03-09 NOTE — Telephone Encounter (Signed)
Left voicemail message to call back  

## 2020-03-09 NOTE — Telephone Encounter (Signed)
Left message 03/04/20 and 03/09/20 for patient to call and discuss scheduling the Cardiac MRI ordered by Dr. Okey Dupre

## 2020-03-10 NOTE — Telephone Encounter (Signed)
Left message for patient to call and discuss scheduling the Cardiac MRI ordered by Dr. End 

## 2020-03-11 ENCOUNTER — Other Ambulatory Visit
Admission: RE | Admit: 2020-03-11 | Discharge: 2020-03-11 | Disposition: A | Discharge status: Home / Self Care | Payer: BC Managed Care – PPO | Attending: Urology | Admitting: Urology

## 2020-03-11 ENCOUNTER — Other Ambulatory Visit
Admission: RE | Admit: 2020-03-11 | Discharge: 2020-03-11 | Disposition: A | Discharge status: Home / Self Care | Payer: BC Managed Care – PPO | Source: Ambulatory Visit | Attending: Emergency Medicine | Admitting: Emergency Medicine

## 2020-03-11 ENCOUNTER — Other Ambulatory Visit: Payer: Self-pay

## 2020-03-11 ENCOUNTER — Encounter: Payer: Self-pay | Admitting: Urology

## 2020-03-11 ENCOUNTER — Ambulatory Visit (INDEPENDENT_AMBULATORY_CARE_PROVIDER_SITE_OTHER): Payer: BC Managed Care – PPO | Admitting: Urology

## 2020-03-11 VITALS — BP 153/83 | HR 60 | Ht 67.0 in | Wt 214.0 lb

## 2020-03-11 DIAGNOSIS — I422 Other hypertrophic cardiomyopathy: Secondary | ICD-10-CM

## 2020-03-11 DIAGNOSIS — N138 Other obstructive and reflux uropathy: Secondary | ICD-10-CM

## 2020-03-11 DIAGNOSIS — N529 Male erectile dysfunction, unspecified: Secondary | ICD-10-CM

## 2020-03-11 DIAGNOSIS — N401 Enlarged prostate with lower urinary tract symptoms: Secondary | ICD-10-CM | POA: Insufficient documentation

## 2020-03-11 DIAGNOSIS — C61 Malignant neoplasm of prostate: Secondary | ICD-10-CM | POA: Insufficient documentation

## 2020-03-11 LAB — BASIC METABOLIC PANEL
Anion gap: 7 (ref 5–15)
BUN: 11 mg/dL (ref 6–20)
CO2: 23 mmol/L (ref 22–32)
Calcium: 8.7 mg/dL — ABNORMAL LOW (ref 8.9–10.3)
Chloride: 107 mmol/L (ref 98–111)
Creatinine, Ser: 1.13 mg/dL (ref 0.61–1.24)
GFR, Estimated: >60 mL/min (ref >60)
Glucose, Bld: 100 mg/dL — ABNORMAL HIGH (ref 70–99)
Potassium: 3.8 mmol/L (ref 3.5–5.1)
Sodium: 137 mmol/L (ref 135–145)

## 2020-03-11 LAB — PSA: Prostatic Specific Antigen: 0.42 ng/mL (ref 0.00–4.00)

## 2020-03-11 LAB — BLADDER SCAN AMB NON-IMAGING

## 2020-03-11 NOTE — Progress Notes (Signed)
   03/11/2020 11:16 AM   Dara Hoyer 05-14-1960 [id number]  Reason for visit: Follow up BPH status post HOLEP, very low risk prostate cancer, ED  HPI: I saw Mr. Todd Burns back in urology clinic for follow-up after undergoing HOLEP.  He is a 60 year old male with a number of urologic issues including an elevated PSA of 5.5 and biopsy that showed a single core of very low risk prostate cancer.  He had severe obstructive urinary symptoms, and ultimately opted for a HOLEP on 01/24/2020.  He has erectile dysfunction on sildenafil, but has not been sexually active since surgery.  Pathology from [facility] showed 59 g of only benign prostate tissue.  We discussed the need to continue to follow the PSA with his history of very low risk prostate cancer, but I expect he will do very well and we can just check the PSA on a yearly basis.  He is doing very well from a urinary perspective and is voiding with a very strong stream and having no trouble emptying.  His main issue is some persistent urgency, but is not having any incontinence.  IPSS score today is 10, with quality of life pleased which is improved significantly from an IPSS score of 23 preop.  PVR is normal at 0 mL.  He is having some slight burning at the beginning and end of stream that is minimally bothersome, and occasionally has some spraying stream.  On exam today, he did appear to have a small fossa navicularis stricture, and this was gently dilated with a lidocaine jelly and a 14 [place] Coloplast catheter.  -PSA today, call with results-> anticipate yearly PSA with history of very low risk prostate cancer -Continue sildenafil for ED 60 to 100 mg on demand, consider trial of Cialis in the future if refractory ED -RTC 6 months IPSS PVR, symptom check, sooner if problems  Sondra Come, MD  [facility] [address] [place], [place] [address] [phone]

## 2020-03-12 ENCOUNTER — Telehealth: Payer: Self-pay | Admitting: Internal Medicine

## 2020-03-12 ENCOUNTER — Telehealth: Payer: Self-pay

## 2020-03-12 DIAGNOSIS — I10 Essential (primary) hypertension: Secondary | ICD-10-CM

## 2020-03-12 DIAGNOSIS — Z79899 Other long term (current) drug therapy: Secondary | ICD-10-CM

## 2020-03-12 NOTE — Telephone Encounter (Signed)
-----   Message from Sondra Come, MD sent at 03/12/2020  8:07 AM EST ----- Great news, PSA down to 0.42 after HOLEP from 5.5. Keep follow up as scheduled  Legrand Rams, MD 03/12/2020

## 2020-03-12 NOTE — Telephone Encounter (Signed)
Called pt no answer. Left detailed message for pt per DPR. Advised pt to call back for questions or concerns.  

## 2020-03-12 NOTE — Telephone Encounter (Signed)
Attempted to call the patient. No answer- I left a message to please call back. Phone note started.  

## 2020-03-12 NOTE — Telephone Encounter (Signed)
Yvonne Kendall, MD  03/12/2020 2:02 PM EST      No significant abnormalities noted. Ok to continue current medications with repeat BMP in ~1 month.

## 2020-03-13 ENCOUNTER — Encounter: Payer: Self-pay | Admitting: Internal Medicine

## 2020-03-13 NOTE — Telephone Encounter (Signed)
Left message for patient to call and discuss scheduling the Cardiac MRI ordered by Dr. Saunders Revel.  Will also mail letter requesting patient to call

## 2020-03-13 NOTE — Telephone Encounter (Signed)
No answer. Left detailed message with results, ok per DPR, and to call back if any questions. Also tried to contact wife, ok per dpr. No answer. Left message to call back.

## 2020-03-26 ENCOUNTER — Other Ambulatory Visit: Payer: Self-pay | Admitting: Internal Medicine

## 2020-04-13 ENCOUNTER — Telehealth: Payer: Self-pay | Admitting: Family

## 2020-04-13 NOTE — Telephone Encounter (Signed)
Left message for patient to call and discuss scheduling the Cardiac MRI that has been ordered. 

## 2020-04-15 ENCOUNTER — Ambulatory Visit: Payer: BC Managed Care – PPO | Admitting: Family

## 2020-04-16 NOTE — Telephone Encounter (Signed)
Left message for patient to call and discuss scheduling the Cardiac MRI ordered by Laurann Montana, NP

## 2020-04-18 ENCOUNTER — Other Ambulatory Visit: Payer: Self-pay | Admitting: Family Medicine

## 2020-04-18 DIAGNOSIS — I10 Essential (primary) hypertension: Secondary | ICD-10-CM

## 2020-04-23 NOTE — Telephone Encounter (Signed)
Left message for patient to call and discuss scheduling the Cardiac MRI ordered by Dr. Saunders Revel

## 2020-04-25 ENCOUNTER — Other Ambulatory Visit: Payer: Self-pay | Admitting: Family Medicine

## 2020-04-25 DIAGNOSIS — I10 Essential (primary) hypertension: Secondary | ICD-10-CM

## 2020-04-29 NOTE — Progress Notes (Signed)
Office Visit    Patient Name: Todd Burns Date of Encounter: 04/30/2020  PCP:  Olin Hauser, [facility]  Cardiologist:  Nelva Bush, MD  Advanced Practice Provider:  No care team member to display Electrophysiologist:  None   Chief Complaint    Todd Burns is a 60 y.o. male with a hx of hypertension, prediabetes, hypothyroidism, BPH, severe asymmetric hypertrophy of septum concerning for hypertrophic cardiomyopathy, mild coronary artery calcification presents today for follow-up of cardiomyopathy  Past Medical History    Past Medical History:  Diagnosis Date  . BPH (benign prostatic hyperplasia)   . GERD (gastroesophageal reflux disease)   . Hypertension   . Hypothyroidism   . LVH (left ventricular hypertrophy)   . Pre-diabetes   . Tear of MCL (medial collateral ligament) of knee    Past Surgical History:  Procedure Laterality Date  . COLONOSCOPY N/A 08/08/2014   Procedure: COLONOSCOPY;  Surgeon: Lucilla Lame, MD;  Location: [facility];  Service: Gastroenterology;  Laterality: N/A;  . HOLEP-LASER ENUCLEATION OF THE PROSTATE WITH MORCELLATION N/A 01/24/2020   Procedure: HOLEP-LASER ENUCLEATION OF THE PROSTATE WITH MORCELLATION;  Surgeon: Billey Co, MD;  Location: [facility];  Service: Urology;  Laterality: N/A;  . MEDIAL COLLATERAL LIGAMENT REPAIR, KNEE  1999  . SHOULDER ARTHROSCOPY WITH ROTATOR CUFF REPAIR Right 10/01/2014   Procedure: SHOULDER ARTHROSCOPY WITH ROTATOR CUFF REPAIR;  Surgeon: Earnestine Leys, MD;  Location: [facility];  Service: Orthopedics;  Laterality: Right;  Mini open rotator cuff repair Arthroscopy  Distal clavicle excision Subacromial decompression    Allergies  Allergies  Allergen Reactions  . Lisinopril Other (See Comments) and Cough    MUCUS    History of Present Illness    Todd Burns is a 60 y.o. male with a hx of  hypertension, prediabetes, hypothyroidism, BPH, severe asymmetric  hypertrophy of septum concerning for hypertrophic cardiomyopathy, mild coronary artery calcification last seen 03/04/2020 by Dr. Saunders Revel.  He had an echocardiogram 1124 he would showed severe asymmetric hypertrophy of the septum concerning for hypertrophic cardiomyopathy.  LVEF 55 to 60% with grade 2 diastolic dysfunction.  Also noted coronary artery calcification on imaging.  He was seen 03/04/2020 by Dr. Saunders Revel.  He had developed a right shoulder abscess for unclear reasons which was drained in the emergency department.  His blood pressure was noted to be markedly elevated.  Cardiomyopathy was discussed he was recommended for cardiac MRI to rule out LVH which was not completed.  Spironolactone was added for optimal blood pressure control.  Repeat blood work 03/11/2020 showed normal renal function and electrolytes.  He presents today for follow-up. Reports no shortness of breath nor dyspnea on exertion. Reports no chest pain, pressure, or tightness. No edema, orthopnea, PND. Reports no palpitations. He has two adopted sons who are 45 and 47 who he tries to stay active with. Checking BP Intermittently at home with readings 160s-170s by arm cuff. He did not think he needed the cardiac MRI and we reviewed the indication and utility of cardiac MRI.   EKGs/Labs/Other Studies Reviewed:   The following studies were reviewed today:  TTE (01/23/2020): Severe asymmetric septal hypertrophy of the left ventricle. LVEF 55-60% with grade 2 diastolic dysfunction. GLS -10.7%. Mild left atrial enlargement. Mild mitral regurgitation. Normal RV size and function.   EKG:  EKG is  ordered today.  The ekg ordered today demonstrates SB 51 bpm with LVH and repolarization abnormality.  Recent Labs: 02/05/2020: ALT 19; TSH 5.46 02/06/2020: Hemoglobin 13.5; Platelets 249 03/11/2020: BUN 11; Creatinine, Ser 1.13; Potassium 3.8; Sodium 137  Recent Lipid Panel    Component Value Date/Time   CHOL 192 02/05/2020 1047   TRIG 128  02/05/2020 1047   HDL 34 (L) 02/05/2020 1047   CHOLHDL 5.6 (H) 02/05/2020 1047   LDLCALC 134 (H) 02/05/2020 1047    Home Medications   Current Meds  Medication Sig  . amLODipine (NORVASC) 10 MG tablet TAKE 1 TABLET BY MOUTH EVERY DAY  . fluticasone (FLONASE) 50 MCG/ACT nasal spray Place 2 sprays into both nostrils daily. Use for 4-6 weeks then stop and use seasonally or as needed.  Marland Kitchen levothyroxine (SYNTHROID) 175 MCG tablet TAKE 1 TABLET (175 MCG TOTAL) BY MOUTH DAILY BEFORE BREAKFAST.  Marland Kitchen losartan (COZAAR) 100 MG tablet TAKE 1 TABLET BY MOUTH EVERY DAY  . naproxen (NAPROSYN) 500 MG tablet Take 500 mg by mouth 2 (two) times daily with a meal. As needed  . omeprazole (PRILOSEC) 40 MG capsule Take 1 capsule (40 mg total) by mouth daily before breakfast. 4-6 weeks then can taper down off med  . rosuvastatin (CRESTOR) 10 MG tablet Take 1 tablet (10 mg total) by mouth at bedtime.  . sildenafil (REVATIO) 20 MG tablet Take 1-5 pills about 60 min prior to sex. Start with 1 and increase as needed.  Marland Kitchen spironolactone (ALDACTONE) 25 MG tablet TAKE 1 TABLET (25 MG TOTAL) BY MOUTH DAILY.  Marland Kitchen triamcinolone cream (KENALOG) 0.1 % Apply 1 application topically 2 (two) times daily. As needed for bug bites or itching     Review of Systems     All other systems reviewed and are otherwise negative except as noted above.  Physical Exam    VS:  BP (!) 158/90 (BP Location: Left Arm, Patient Position: Sitting, Cuff Size: Normal)   Pulse (!) 51   Ht 5' 7.5" (1.715 m)   Wt 218 lb (98.9 kg)   SpO2 97%   BMI 33.64 kg/m  , BMI Body mass index is 33.64 kg/m.  Wt Readings from Last 3 Encounters:  04/30/20 218 lb (98.9 kg)  03/11/20 214 lb (97.1 kg)  03/04/20 214 lb (97.1 kg)    GEN: Well nourished, overweight, well developed, in no acute distress. HEENT: normal. Neck: Supple, no JVD, carotid bruits, or masses. Cardiac: RRR, no murmurs, rubs, or gallops. No clubbing, cyanosis, edema.  Radials/DP/PT 2+ and  equal bilaterally.  Respiratory:  Respirations regular and unlabored, clear to auscultation bilaterally. GI: Soft, nontender, nondistended. MS: No deformity or atrophy. Skin: Warm and dry, no rash. Neuro:  Strength and sensation are intact. Psych: Normal affect.  Assessment & Plan    1. Hypertrophic cardiomyopathy- He was recommended for cardiac MRI at appointment 02/25/2020 but has not yet completed. Provided phone number to schedule, he was agreeable to do so. No lightheadedness, dizziness, near syncope. Blood pressure control, as below.   2. Uncontrolled hypertension- Since addition of Spironolactone 25mg  QD, BP remains approximately the same as previous office visit. Endorses compliance. Took medications about 1 hour prior to office visit. Continue Spironolactone 25mg  QD, Amlodipine 10mg  QD, Losartan 100mg  daily. Will defer addition of beta blocker due to relative bradycardia which is asymptomatic. Will defer transition to more potent ARB as he tells me he just picked up Losartan. Start Hydralazine 25mg  BID. BMP today. Await results of cardiac MRI. Follows with PCP in regards to his hypothyroidism. At follow up, consider sleep study and/or  renal ultrasound to assess for secondary causes of hypertension. Will defer today as cardiac MRI is priority.   3. Abnormal EKG - Stable findings of LVH. He is bradycardic but asymptomatic with no lightheadedness, dizziness, near syncope, nor syncope.   Disposition: Follow up in 6 week(s) with Dr. Saunders Revel or APP  Signed, Loel Dubonnet, NP 04/30/2020, 8:31 AM [place]

## 2020-04-30 ENCOUNTER — Other Ambulatory Visit: Payer: Self-pay | Admitting: Family Medicine

## 2020-04-30 ENCOUNTER — Encounter: Payer: Self-pay | Admitting: Family

## 2020-04-30 ENCOUNTER — Ambulatory Visit (INDEPENDENT_AMBULATORY_CARE_PROVIDER_SITE_OTHER): Payer: BC Managed Care – PPO | Admitting: Family

## 2020-04-30 ENCOUNTER — Other Ambulatory Visit: Payer: Self-pay

## 2020-04-30 VITALS — BP 158/90 | HR 51 | Ht 67.5 in | Wt 218.0 lb

## 2020-04-30 DIAGNOSIS — R9431 Abnormal electrocardiogram [ECG] [EKG]: Secondary | ICD-10-CM

## 2020-04-30 DIAGNOSIS — I10 Essential (primary) hypertension: Secondary | ICD-10-CM | POA: Diagnosis not present

## 2020-04-30 DIAGNOSIS — I422 Other hypertrophic cardiomyopathy: Secondary | ICD-10-CM | POA: Diagnosis not present

## 2020-04-30 DIAGNOSIS — E039 Hypothyroidism, unspecified: Secondary | ICD-10-CM

## 2020-04-30 NOTE — Patient Instructions (Addendum)
Medication Instructions:  Your physician has recommended you make the following change in your medication:   START Hydralazine 25mg  twice daily  *If you need a refill on your cardiac medications before your next appointment, please call your pharmacy*  Lab Work: Your physician lab work today: BMP  If you have labs (blood work) drawn today and your tests are completely normal, you will receive your results only by: Marland Kitchen MyChart Message (if you have MyChart) OR . A paper copy in the mail If you have any lab test that is abnormal or we need to change your treatment, we will call you to review the results.  Testing/Procedures: Your EKG today shows your heart has thickened muscle which we also know from your echocardiogram (ultrasound).  Follow-Up: At [facility], you and your health needs are our priority.  As part of our continuing mission to provide you with exceptional heart care, we have created designated Provider Care Teams.  These Care Teams include your primary Cardiologist (physician) and Advanced Practice Providers (APPs -  Physician Assistants and Nurse Practitioners) who all work together to provide you with the care you need, when you need it.  We recommend signing up for the patient portal called "MyChart".  Sign up information is provided on this After Visit Summary.  MyChart is used to connect with patients for Virtual Visits (Telemedicine).  Patients are able to view lab/test results, encounter notes, upcoming appointments, etc.  Non-urgent messages can be sent to your provider as well.   To learn more about what you can do with MyChart, go to [email or web address].    Your next appointment:   6 week(s)  The format for your next appointment:   In Person  Provider:   You may see Nelva Bush, MD or one of the following Advanced Practice Providers on your designated Care Team:    Murray Hodgkins, NP  Christell Faith, PA-C  Marrianne Mood, PA-C  Cadence Kathlen Mody,  [place]  Laurann Montana, NP  Other Instructions  Please call to schedule your cardiac MRI [phone]

## 2020-05-01 ENCOUNTER — Other Ambulatory Visit: Payer: Self-pay | Admitting: Internal Medicine

## 2020-05-01 LAB — BASIC METABOLIC PANEL
BUN/Creatinine Ratio: 10 (ref 10–24)
BUN: 10 mg/dL (ref 8–27)
CO2: 19 mmol/L — ABNORMAL LOW (ref 20–29)
Calcium: 9.2 mg/dL (ref 8.6–10.2)
Chloride: 104 mmol/L (ref 96–106)
Creatinine, Ser: 1.01 mg/dL (ref 0.76–1.27)
GFR calc Af Amer: 93 mL/min/{1.73_m2} (ref >59)
GFR calc non Af Amer: 80 mL/min/{1.73_m2} (ref >59)
Glucose: 91 mg/dL (ref 65–99)
Potassium: 4.1 mmol/L (ref 3.5–5.2)
Sodium: 139 mmol/L (ref 134–144)

## 2020-05-25 ENCOUNTER — Other Ambulatory Visit: Payer: Self-pay | Admitting: Family Medicine

## 2020-05-25 DIAGNOSIS — J3089 Other allergic rhinitis: Secondary | ICD-10-CM

## 2020-05-25 DIAGNOSIS — I10 Essential (primary) hypertension: Secondary | ICD-10-CM

## 2020-05-25 NOTE — Telephone Encounter (Signed)
Requested medication (s) are due for refill today: No  Requested medication (s) are on the active medication list: D/C 03/04/20  Last refill:  07/30/19  Future visit scheduled: No  Notes to clinic:  Called pt. To see if he wanted medication refilled. Left message to call back.    Requested Prescriptions  Pending Prescriptions Disp Refills   montelukast (SINGULAIR) 10 MG tablet [Pharmacy Med Name: MONTELUKAST SOD 10 MG TABLET] 90 tablet 1    Sig: TAKE 1 TABLET BY MOUTH EVERYDAY AT BEDTIME      Pulmonology:  Leukotriene Inhibitors Passed - 05/25/2020 12:38 PM      Passed - Valid encounter within last 12 months    Recent Outpatient Visits           3 months ago Abscess of skin of right shoulder   [place], DO   6 months ago Benign prostatic hyperplasia with urinary hesitancy   [place], DO   10 months ago Benign prostatic hyperplasia with incomplete bladder emptying   [place], Devonne Doughty, DO   1 year ago Essential hypertension   [place], Devonne Doughty, DO   2 years ago Annual physical exam   [place], DO       Future Appointments             In 2 weeks Gilford Rile, Martie Lee, NP [facility] [place], [facility]              Refused Prescriptions Disp Refills   losartan (COZAAR) 100 MG tablet [Pharmacy Med Name: LOSARTAN POTASSIUM 100 MG TAB] 90 tablet 0    Sig: TAKE 1 TABLET BY MOUTH EVERY DAY      Cardiovascular:  Angiotensin Receptor Blockers Failed - 05/25/2020 12:38 PM      Failed - Last BP in normal range    BP Readings from Last 1 Encounters:  04/30/20 (!) 158/90          Passed - Cr in normal range and within 180 days    Creat  Date Value Ref Range Status  02/05/2020 1.20 0.70 - 1.25 mg/dL Final    Comment:    For patients >22 years of age, the reference  limit for Creatinine is approximately 13% higher for people identified as African-American. .    Creatinine, Ser  Date Value Ref Range Status  04/30/2020 1.01 0.76 - 1.27 mg/dL Final    Comment:                   **Effective May 04, 2020 [facility] will begin**                  reporting the 2021 CKD-EPI creatinine equation that                  estimates kidney function without a race variable.           Passed - K in normal range and within 180 days    Potassium  Date Value Ref Range Status  04/30/2020 4.1 3.5 - 5.2 mmol/L Final          Passed - Patient is not pregnant      Passed - Valid encounter within last 6 months    Recent Outpatient Visits           3 months ago Abscess of skin of right shoulder  [place], DO   6 months ago Benign prostatic hyperplasia with urinary hesitancy   [place], DO   10 months ago Benign prostatic hyperplasia with incomplete bladder emptying   [employer], Devonne Doughty, DO   1 year ago Essential hypertension   [place], DO   2 years ago Annual physical exam   [facility] Olin Hauser, DO       Future Appointments             In 2 weeks Gilford Rile, Martie Lee, NP [facility], [place]

## 2020-06-11 ENCOUNTER — Other Ambulatory Visit: Payer: Self-pay

## 2020-06-11 ENCOUNTER — Encounter: Payer: Self-pay | Admitting: Family

## 2020-06-11 ENCOUNTER — Encounter: Payer: Self-pay | Admitting: Urology

## 2020-06-11 ENCOUNTER — Ambulatory Visit (INDEPENDENT_AMBULATORY_CARE_PROVIDER_SITE_OTHER): Payer: BC Managed Care – PPO | Admitting: Family

## 2020-06-11 ENCOUNTER — Ambulatory Visit: Payer: BC Managed Care – PPO | Admitting: Urology

## 2020-06-11 VITALS — BP 140/82 | HR 58 | Ht 67.0 in | Wt 214.5 lb

## 2020-06-11 DIAGNOSIS — Z720 Tobacco use: Secondary | ICD-10-CM | POA: Diagnosis not present

## 2020-06-11 DIAGNOSIS — G4730 Sleep apnea, unspecified: Secondary | ICD-10-CM

## 2020-06-11 DIAGNOSIS — I10 Essential (primary) hypertension: Secondary | ICD-10-CM

## 2020-06-11 DIAGNOSIS — R0683 Snoring: Secondary | ICD-10-CM

## 2020-06-11 DIAGNOSIS — Z79899 Other long term (current) drug therapy: Secondary | ICD-10-CM | POA: Diagnosis not present

## 2020-06-11 DIAGNOSIS — I422 Other hypertrophic cardiomyopathy: Secondary | ICD-10-CM | POA: Diagnosis not present

## 2020-06-11 DIAGNOSIS — R9431 Abnormal electrocardiogram [ECG] [EKG]: Secondary | ICD-10-CM

## 2020-06-11 MED ORDER — SPIRONOLACTONE 25 MG PO TABS: 25.0000 mg | ORAL_TABLET | Freq: Every day | ORAL | 1 refills | Status: DC

## 2020-06-11 MED ORDER — HYDRALAZINE HCL 25 MG PO TABS: 25.0000 mg | ORAL_TABLET | Freq: Two times a day (BID) | ORAL | 2 refills | Status: DC

## 2020-06-11 NOTE — Progress Notes (Signed)
Office Visit    Patient Name: Todd Burns Date of Encounter: 06/11/2020  PCP:  Olin Hauser, [facility]  Cardiologist:  Nelva Bush, MD  Advanced Practice Provider:  No care team member to display Electrophysiologist:  None   Chief Complaint    Todd Burns is a 60 y.o. male with a hx of hypertension, prediabetes, hypothyroidism, BPH, severe asymmetric hypertrophy of septum concerning for hypertrophic cardiomyopathy, mild coronary artery calcification presents today for follow-up of cardiomyopathy  Past Medical History    Past Medical History:  Diagnosis Date  . BPH (benign prostatic hyperplasia)   . GERD (gastroesophageal reflux disease)   . Hypertension   . Hypothyroidism   . LVH (left ventricular hypertrophy)   . Pre-diabetes   . Tear of MCL (medial collateral ligament) of knee    Past Surgical History:  Procedure Laterality Date  . COLONOSCOPY N/A 08/08/2014   Procedure: COLONOSCOPY;  Surgeon: Lucilla Lame, MD;  Location: [facility];  Service: Gastroenterology;  Laterality: N/A;  . HOLEP-LASER ENUCLEATION OF THE PROSTATE WITH MORCELLATION N/A 01/24/2020   Procedure: HOLEP-LASER ENUCLEATION OF THE PROSTATE WITH MORCELLATION;  Surgeon: Billey Co, MD;  Location: [facility];  Service: Urology;  Laterality: N/A;  . MEDIAL COLLATERAL LIGAMENT REPAIR, KNEE  1999  . SHOULDER ARTHROSCOPY WITH ROTATOR CUFF REPAIR Right 10/01/2014   Procedure: SHOULDER ARTHROSCOPY WITH ROTATOR CUFF REPAIR;  Surgeon: Earnestine Leys, MD;  Location: [facility];  Service: Orthopedics;  Laterality: Right;  Mini open rotator cuff repair Arthroscopy  Distal clavicle excision Subacromial decompression    Allergies  Allergies  Allergen Reactions  . Lisinopril Other (See Comments) and Cough    MUCUS    History of Present Illness    Todd Burns is a 60 y.o. male with a hx of  hypertension, prediabetes, hypothyroidism, BPH, severe asymmetric  hypertrophy of septum concerning for hypertrophic cardiomyopathy, mild coronary artery calcification last seen 04/30/20.  He had an echocardiogram 1124 he would showed severe asymmetric hypertrophy of the septum concerning for hypertrophic cardiomyopathy.  LVEF 55 to 60% with grade 2 diastolic dysfunction.  Also noted coronary artery calcification on imaging.  He was seen 03/04/2020 by Dr. Saunders Revel.  He had developed a right shoulder abscess for unclear reasons which was drained in the emergency department.  His blood pressure was noted to be markedly elevated.  Cardiomyopathy was discussed he was recommended for cardiac MRI to rule out LVH which was not completed.  Spironolactone was added for optimal blood pressure control.  Repeat blood work 03/11/2020 showed normal renal function and electrolytes.  At follow up 04/30/20 he was doing overall well from cardiac perspective though BP remained elevated with home readings 160s-170s by arm cuff. Hydralazine 25mg  BID was added. BMP showed normal renal function and electrolytes. He has two adopted sons who are 21 and 37 who he tries to stay active with. He was reminded of recommendation for cardiac MRI. He was noted to be bradycardic but asymptomatic.  He presents today for follow up. He did not start Hydralazine as prescription was not sent and he did not contact the office to request.  Tells me he has lost two family members since last seen and understandably has not had time to schedule his cardiac MRI.  Tells me he plans to call today and schedule.  Offered my condolences.  Has not been checking his blood pressure routinely at home.  Denies chest pain, pressure, tightness.  Reports no shortness of breath at rest and stable dyspnea on exertion.  No lightheadedness, dizziness, near-syncope, syncope.  We discussed the recommendation for sleep study in the setting of uncontrolled hypertension.  He tells me that this has been previously recommended but he has deferred due  to cost.  We reviewed the new WatchPAT technology available in our office. He wishes to discuss again after his cardiac MRI. Tells me he wakes up in the middle of the night and is not sure why. This is longstanding and he attributes it to previously working third shift. He snores at night. Reports he wakes feeling tired intermittently.   He is very motivated to quit smoking and tells me his 2 sons also are encouraging him to quit smoking.  He tells me he previously quit cold [place] and was able to sustain for 11 years though resumed after the surgery.  We discussed possible plans for quitting including nicotine patches and utilization of Wellbutrin.  He wishes to think about these options and discuss again at follow-up.  He plans to try to reduce how much he is smoking per day until seen in follow up.   EKGs/Labs/Other Studies Reviewed:   The following studies were reviewed today:  TTE (01/23/2020): Severe asymmetric septal hypertrophy of the left ventricle. LVEF 55-60% with grade 2 diastolic dysfunction. GLS -10.7%. Mild left atrial enlargement. Mild mitral regurgitation. Normal RV size and function.   EKG:  EKG is  ordered today.  The ekg ordered today demonstrates SB 59 bpm with LVH and repolarization abnormality. STable compared to previous.  Recent Labs: 02/05/2020: ALT 19; TSH 5.46 02/06/2020: Hemoglobin 13.5; Platelets 249 04/30/2020: BUN 10; Creatinine, Ser 1.01; Potassium 4.1; Sodium 139  Recent Lipid Panel    Component Value Date/Time   CHOL 192 02/05/2020 1047   TRIG 128 02/05/2020 1047   HDL 34 (L) 02/05/2020 1047   CHOLHDL 5.6 (H) 02/05/2020 1047   LDLCALC 134 (H) 02/05/2020 1047    Home Medications   Current Meds  Medication Sig  . amLODipine (NORVASC) 10 MG tablet TAKE 1 TABLET BY MOUTH EVERY DAY  . fluticasone (FLONASE) 50 MCG/ACT nasal spray Place 2 sprays into both nostrils daily. Use for 4-6 weeks then stop and use seasonally or as needed.  . hydrALAZINE (APRESOLINE)  25 MG tablet Take 1 tablet (25 mg total) by mouth in the morning and at bedtime.  Marland Kitchen levothyroxine (SYNTHROID) 175 MCG tablet TAKE 1 TABLET (175 MCG TOTAL) BY MOUTH DAILY BEFORE BREAKFAST.  Marland Kitchen losartan (COZAAR) 100 MG tablet TAKE 1 TABLET BY MOUTH EVERY DAY  . montelukast (SINGULAIR) 10 MG tablet TAKE 1 TABLET BY MOUTH EVERYDAY AT BEDTIME  . naproxen (NAPROSYN) 500 MG tablet Take 500 mg by mouth 2 (two) times daily with a meal. As needed  . omeprazole (PRILOSEC) 40 MG capsule Take 1 capsule (40 mg total) by mouth daily before breakfast. 4-6 weeks then can taper down off med  . rosuvastatin (CRESTOR) 10 MG tablet Take 1 tablet (10 mg total) by mouth at bedtime.  . sildenafil (REVATIO) 20 MG tablet Take 1-5 pills about 60 min prior to sex. Start with 1 and increase as needed.  . triamcinolone cream (KENALOG) 0.1 % Apply 1 application topically 2 (two) times daily. As needed for bug bites or itching  . [DISCONTINUED] spironolactone (ALDACTONE) 25 MG tablet TAKE 1 TABLET (25 MG TOTAL) BY MOUTH DAILY.     Review of Systems     All other systems reviewed and  are otherwise negative except as noted above.  Physical Exam    VS:  BP 140/82 (BP Location: Left Arm, Patient Position: Sitting, Cuff Size: Normal)   Pulse (!) 58   Ht 5\' 7"  (1.702 m)   Wt 214 lb 8 oz (97.3 kg)   SpO2 98%   BMI 33.60 kg/m  , BMI Body mass index is 33.6 kg/m.  Wt Readings from Last 3 Encounters:  06/11/20 214 lb 8 oz (97.3 kg)  04/30/20 218 lb (98.9 kg)  03/11/20 214 lb (97.1 kg)    GEN: Well nourished, overweight, well developed, in no acute distress. HEENT: normal. Neck: Supple, no JVD, carotid bruits, or masses. Cardiac: RRR, no murmurs, rubs, or gallops. No clubbing, cyanosis, edema.  Radials/DP/PT 2+ and equal bilaterally.  Respiratory:  Respirations regular and unlabored, clear to auscultation bilaterally. GI: Soft, nontender, nondistended. MS: No deformity or atrophy. Skin: Warm and dry, no rash. Neuro:   Strength and sensation are intact. Psych: Normal affect.  Assessment & Plan    1. Hypertrophic cardiomyopathy- He was recommended for cardiac MRI at appointment 02/25/2020 but has not yet completed. Provided phone number to schedule, he was agreeable to do so. No lightheadedness, dizziness, near syncope. Blood pressure control, as below.   2. Uncontrolled hypertension- BP continues to improve though not at goal of <130/80. Home monitoring encouraged. Continue Spironolactone 25mg  QD, Amlodipine 10mg  QD, Losartan 100mg  daily. Will defer addition of beta blocker due to relative bradycardia which is asymptomatic. Start Hydralazine 25mg  BID.  Await results of cardiac MRI. Follows with PCP in regards to his hypothyroidism.  Discussion regarding potential sleep study, detailed below.  Could consider renal artery ultrasound for assessment of secondary causes of hypertension if blood pressure remains above goal. Will defer today as cardiac MRI is priority.   3. Abnormal EKG - Stable findings of LVH with repolarization abnormality. He is bradycardic but asymptomatic with no lightheadedness, dizziness, near syncope, nor syncope.   4. Snores / Sleep disordered breathing -reports snoring.  Notes he wakes up in the middle of the night and is unclear why.  Has previously been recommended for sleep study but never completed.  We discussed WatchPAT and he wishes to complete cardiac MRI then consider sleep study. He does have concerns regarding cost.   5. Tobacco use - Motivated to quit.  Presently smoking 1 pack/day.  We discussed quitting with nicotine patches or utilization of Wellbutrin.  He wishes to consider these options and discuss at follow-up.  In the interim he will try to reduce, cigarettes he is smoking per day.  Disposition: Follow up in 2 month(s) with Dr. Saunders Revel or APP  Signed, Loel Dubonnet, NP 06/11/2020, 12:21 PM [place]

## 2020-06-11 NOTE — Patient Instructions (Addendum)
Please call to schedule your cardiac MRI. The phone number is [phone].  Medication Instructions:  Your physician has recommended you make the following change in your medication:   START Hydralazine 25mg  one tablet twice daily *this is to help decrease your blood pressure*  *If you need a refill on your cardiac medications before your next appointment, please call your pharmacy*  Lab Work: None ordered today.   Testing/Procedures:  Please call to schedule your cardiac MRI. The phone number is [phone].  Follow-Up: At [facility], you and your health needs are our priority.  As part of our continuing mission to provide you with exceptional heart care, we have created designated Provider Care Teams.  These Care Teams include your primary Cardiologist (physician) and Advanced Practice Providers (APPs -  Physician Assistants and Nurse Practitioners) who all work together to provide you with the care you need, when you need it.  We recommend signing up for the patient portal called "MyChart".  Sign up information is provided on this After Visit Summary.  MyChart is used to connect with patients for Virtual Visits (Telemedicine).  Patients are able to view lab/test results, encounter notes, upcoming appointments, etc.  Non-urgent messages can be sent to your provider as well.   To learn more about what you can do with MyChart, go to [email or web address].    Your next appointment:   2 month(s)  The format for your next appointment:   In Person  Provider:   You may see Nelva Bush, MD or one of the following Advanced Practice Providers on your designated Care Team:    Murray Hodgkins, NP  Christell Faith, PA-C  Marrianne Mood, PA-C  Cadence Kathlen Mody, [place]  Laurann Montana, NP  Other Instructions  Heart Healthy Diet Recommendations: A low-salt diet is recommended. Meats should be grilled, baked, or boiled. Avoid fried foods. Focus on lean protein sources like fish or  chicken with vegetables and fruits. The American Heart Association is a Microbiologist!  American Heart Association Diet and Lifeystyle Recommendations   Exercise recommendations: The American Heart Association recommends 150 minutes of moderate intensity exercise weekly. Try 30 minutes of moderate intensity exercise 4-5 times per week. This could include walking, jogging, or swimming.  Tips to Measure your Blood Pressure Correctly Here's what you can do to ensure a correct reading: . Don't drink a caffeinated beverage or smoke during the 30 minutes before the test. . Sit quietly for five minutes before the test begins. . During the measurement, sit in a chair with your feet on the floor and your arm supported so your elbow is at about heart level. . The inflatable part of the cuff should completely cover at least 80% of your upper arm, and the cuff should be placed on bare skin, not over a shirt. . Don't talk during the measurement. . Have your blood pressure measured twice, with a brief break in between. If the readings are different by 5 points or more, have it done a third time.  Blood pressure categories  Blood pressure category SYSTOLIC (upper number)  DIASTOLIC (lower number)  Normal Less than 120 mm Hg and Less than 80 mm Hg  Elevated 120-129 mm Hg and Less than 80 mm Hg  High blood pressure: Stage 1 hypertension 130-139 mm Hg or 80-89 mm Hg  High blood pressure: Stage 2 hypertension 140 mm Hg or higher or 90 mm Hg or higher  Hypertensive crisis (consult your doctor immediately) Higher than 180 mm Hg and/or  Higher than 120 mm Hg  Source: American Heart Association and American Stroke Association. For more on getting your blood pressure under control, buy Controlling Your Blood Pressure, a Special Health Report from [facility].   Blood Pressure Log   Date   Time  Blood Pressure  Position  Example: Nov 1 9 AM 124/78 sitting                                                      Managing the Challenge of Quitting Smoking Quitting smoking is a physical and mental challenge. You will face cravings, withdrawal symptoms, and temptation. Before quitting, work with your health care provider to make a plan that can help you manage quitting. Preparation can help you quit and keep you from giving in. How to manage lifestyle changes Managing stress Stress can make you want to smoke, and wanting to smoke may cause stress. It is important to find ways to manage your stress. You might try some of the following:  Practice relaxation techniques. ? Breathe slowly and deeply, in through your nose and out through your mouth. ? Listen to music. ? Soak in a bath or take a shower. ? Imagine a peaceful place or vacation.  Get some support. ? Talk with family or friends about your stress. ? Join a support group. ? Talk with a counselor or therapist.  Get some physical activity. ? Go for a walk, run, or bike ride. ? Play a favorite sport. ? Practice yoga.   Medicines Talk with your health care provider about medicines that might help you deal with cravings and make quitting easier for you. Relationships Social situations can be difficult when you are quitting smoking. To manage this, you can:  Avoid parties and other social situations where people might be smoking.  Avoid alcohol.  Leave right away if you have the urge to smoke.  Explain to your family and friends that you are quitting smoking. Ask for support and let them know you might be a bit grumpy.  Plan activities where smoking is not an option. General instructions Be aware that many people gain weight after they quit smoking. However, not everyone does. To keep from gaining weight, have a plan in place before you quit and stick to the plan after you quit. Your plan should include:  Having healthy snacks. When you have a craving, it may help to: ? Eat popcorn, carrots, celery, or other  cut vegetables. ? Chew sugar-free gum.  Changing how you eat. ? Eat small portion sizes at meals. ? Eat 4-6 small meals throughout the day instead of 1-2 large meals a day. ? Be mindful when you eat. Do not watch television or do other things that might distract you as you eat.  Exercising regularly. ? Make time to exercise each day. If you do not have time for a long workout, do short bouts of exercise for 5-10 minutes several times a day. ? Do some form of strengthening exercise, such as weight lifting. ? Do some exercise that gets your heart beating and causes you to breathe deeply, such as walking fast, running, swimming, or biking. This is very important.  Drinking plenty of water or other low-calorie or no-calorie drinks. Drink 6-8 glasses of water daily.   How to recognize withdrawal symptoms Your body  and mind may experience discomfort as you try to get used to not having nicotine in your system. These effects are called withdrawal symptoms. They may include:  Feeling hungrier than normal.  Having trouble concentrating.  Feeling irritable or restless.  Having trouble sleeping.  Feeling depressed.  Craving a cigarette. To manage withdrawal symptoms:  Avoid places, people, and activities that trigger your cravings.  Remember why you want to quit.  Get plenty of sleep.  Avoid coffee and other caffeinated drinks. These may worsen some of your symptoms. These symptoms may surprise you. But be assured that they are normal to have when quitting smoking. How to manage cravings Come up with a plan for how to deal with your cravings. The plan should include the following:  A definition of the specific situation you want to deal with.  An alternative action you will take.  A clear idea for how this action will help.  The name of someone who might help you with this. Cravings usually last for 5-10 minutes. Consider taking the following actions to help you with your plan to  deal with cravings:  Keep your mouth busy. ? Chew sugar-free gum. ? Suck on hard candies or a straw. ? Brush your teeth.  Keep your hands and body busy. ? Change to a different activity right away. ? Squeeze or play with a ball. ? Do an activity or a hobby, such as making bead jewelry, practicing needlepoint, or working with wood. ? Mix up your normal routine. ? Take a short exercise break. Go for a quick walk or run up and down stairs.  Focus on doing something kind or helpful for someone else.  Call a friend or family member to talk during a craving.  Join a support group.  Contact a quitline. Where to find support To get help or find a support group:  Call the [facility]'s Smoking Quitline: 1-800-QUIT NOW 458-459-5936)  Visit the website of the Substance Abuse and [place]: ktimeonline.com  Text QUIT to SmokefreeTXT: 882800 Where to find more information Visit these websites to find more information on quitting smoking:  [place]: www.smokefree.gov  American Lung Association: www.lung.org  American Cancer Society: www.cancer.org  Centers for Disease Control and Prevention: http://www.wolf.info/  American Heart Association: www.heart.org Contact a health care provider if:  You want to change your plan for quitting.  The medicines you are taking are not helping.  Your eating feels out of control or you cannot sleep. Get help right away if:  You feel depressed or become very anxious. Summary  Quitting smoking is a physical and mental challenge. You will face cravings, withdrawal symptoms, and temptation to smoke again. Preparation can help you as you go through these challenges.  Try different techniques to manage stress, handle social situations, and prevent weight gain.  You can deal with cravings by keeping your mouth busy (such as by chewing gum), keeping your hands and body busy, calling family or friends, or  contacting a quitline for people who want to quit smoking.  You can deal with withdrawal symptoms by avoiding places where people smoke, getting plenty of rest, and avoiding drinks with caffeine. This information is not intended to replace advice given to you by your health care provider. Make sure you discuss any questions you have with your health care provider. Document Revised: 12/11/2018 Document Reviewed: 12/11/2018 Elsevier Patient Education  [place].

## 2020-08-12 ENCOUNTER — Ambulatory Visit: Payer: BC Managed Care – PPO | Admitting: Internal Medicine

## 2020-09-05 ENCOUNTER — Other Ambulatory Visit: Payer: Self-pay | Admitting: Family

## 2020-09-05 DIAGNOSIS — I10 Essential (primary) hypertension: Secondary | ICD-10-CM

## 2020-09-08 NOTE — Telephone Encounter (Signed)
Rx request sent to pharmacy.  

## 2020-09-25 ENCOUNTER — Ambulatory Visit: Payer: BC Managed Care – PPO | Admitting: Internal Medicine

## 2020-09-25 ENCOUNTER — Encounter: Payer: Self-pay | Admitting: Internal Medicine

## 2020-09-25 NOTE — Progress Notes (Deleted)
Follow-up Outpatient Visit Date: 09/25/2020  Primary Care Provider: Olin Hauser, DO [address] [place] [address]  Chief Complaint: ***  HPI:  Todd Burns is a 60 y.o. male with history of suspected hypertrophic cardiomyopathy, hypertension, prediabetes, hypothyroidism, and BPH, who presents for follow-up of cardiomyopathy.  He was last seen in our office in April by Laurann Montana, NP, at which time he was doing fairly well.  Previously recommended cardiac MRI was yet to be done; Todd Burns was encouraged to move forward with this.  He has yet to schedule the study.  --------------------------------------------------------------------------------------------------  Past Medical History:  Diagnosis Date   BPH (benign prostatic hyperplasia)    GERD (gastroesophageal reflux disease)    Hypertension    Hypothyroidism    LVH (left ventricular hypertrophy)    Pre-diabetes    Tear of MCL (medial collateral ligament) of knee    Past Surgical History:  Procedure Laterality Date   COLONOSCOPY N/A 08/08/2014   Procedure: COLONOSCOPY;  Surgeon: Lucilla Lame, MD;  Location: [place];  Service: Gastroenterology;  Laterality: N/A;   HOLEP-LASER ENUCLEATION OF THE PROSTATE WITH MORCELLATION N/A 01/24/2020   Procedure: HOLEP-LASER ENUCLEATION OF THE PROSTATE WITH MORCELLATION;  Surgeon: Billey Co, MD;  Location: [facility];  Service: Urology;  Laterality: N/A;   MEDIAL COLLATERAL LIGAMENT REPAIR, KNEE  1999   SHOULDER ARTHROSCOPY WITH ROTATOR CUFF REPAIR Right 10/01/2014   Procedure: SHOULDER ARTHROSCOPY WITH ROTATOR CUFF REPAIR;  Surgeon: Earnestine Leys, MD;  Location: [facility];  Service: Orthopedics;  Laterality: Right;  Mini open rotator cuff repair Arthroscopy  Distal clavicle excision Subacromial decompression    No outpatient medications have been marked as taking for the 09/25/20 encounter (Appointment) with Angelo Prindle, Harrell Gave, MD.    Allergies:  Lisinopril  Social History   Tobacco Use   Smoking status: Every Day    Packs/day: 1.00    Years: 15.00    Pack years: 15.00    Types: Cigarettes   Smokeless tobacco: Never  Vaping Use   Vaping Use: Former  Substance Use Topics   Alcohol use: Not Currently    Comment: Drinks every month or two   Drug use: No    Family History  Problem Relation Age of Onset   Cancer Mother        thyroid   Cancer Father        lung   Thyroid disease Brother    Cancer Maternal Aunt        thyroid   Cancer Sister    Thyroid disease Brother    Cancer Sister     Review of Systems: A 12-system review of systems was performed and was negative except as noted in the HPI.  --------------------------------------------------------------------------------------------------  Physical Exam: There were no vitals taken for this visit.  General:  NAD. Neck: No JVD or HJR. Lungs: Clear to auscultation bilaterally without wheezes or crackles. Heart: Regular rate and rhythm without murmurs, rubs, or gallops. Abdomen: Soft, nontender, nondistended. Extremities: No lower extremity edema.  EKG:  ***  Lab Results  Component Value Date   WBC 11.6 (H) 02/06/2020   HGB 13.5 02/06/2020   HCT 40.3 02/06/2020   MCV 82.9 02/06/2020   PLT 249 02/06/2020    Lab Results  Component Value Date   NA 139 04/30/2020   K 4.1 04/30/2020   CL 104 04/30/2020   CO2 19 (L) 04/30/2020   BUN 10 04/30/2020   CREATININE 1.01 04/30/2020   GLUCOSE  91 04/30/2020   ALT 19 02/05/2020    Lab Results  Component Value Date   CHOL 192 02/05/2020   HDL 34 (L) 02/05/2020   LDLCALC 134 (H) 02/05/2020   TRIG 128 02/05/2020   CHOLHDL 5.6 (H) 02/05/2020    --------------------------------------------------------------------------------------------------  ASSESSMENT AND PLAN: Todd Bush, MD 09/25/2020 6:42 AM

## 2020-09-28 ENCOUNTER — Encounter: Payer: Self-pay | Admitting: Internal Medicine

## 2020-10-22 ENCOUNTER — Other Ambulatory Visit: Payer: Self-pay | Admitting: Family Medicine

## 2020-10-22 DIAGNOSIS — I10 Essential (primary) hypertension: Secondary | ICD-10-CM

## 2020-10-22 NOTE — Telephone Encounter (Signed)
Requested medications are due for refill today.  yes  Requested medications are on the active medications list.  yes  Last refill. 04/18/2020  Future visit scheduled.   no  Notes to clinic.  Pt is more than 3 months overdue for OV.

## 2020-10-22 NOTE — Telephone Encounter (Signed)
Requested medication (s) are due for refill today: no  Requested medication (s) are on the active medication list:  yes  Last refill:  05/23/2020  Future visit scheduled: no  Notes to clinic:  vm left for patient to callback for appt Overdue for follow up and labs    Requested Prescriptions  Pending Prescriptions Disp Refills   losartan (COZAAR) 100 MG tablet [Pharmacy Med Name: LOSARTAN POTASSIUM 100 MG TAB] 90 tablet 0    Sig: TAKE 1 TABLET BY MOUTH EVERY DAY     Cardiovascular:  Angiotensin Receptor Blockers Failed - 10/22/2020  6:55 AM      Failed - Last BP in normal range    BP Readings from Last 1 Encounters:  06/11/20 140/82          Failed - Valid encounter within last 6 months    Recent Outpatient Visits           8 months ago Abscess of skin of right shoulder   [facility] Olin Hauser, DO   11 months ago Benign prostatic hyperplasia with urinary hesitancy   [place], DO   1 year ago Benign prostatic hyperplasia with incomplete bladder emptying   [facility] Olin Hauser, DO   2 years ago Essential hypertension   [place], Devonne Doughty, DO   2 years ago Annual physical exam   [place], DO              Passed - Cr in normal range and within 180 days    Creat  Date Value Ref Range Status  02/05/2020 1.20 0.70 - 1.25 mg/dL Final    Comment:    For patients >72 years of age, the reference limit for Creatinine is approximately 13% higher for people identified as African-American. .    Creatinine, Ser  Date Value Ref Range Status  04/30/2020 1.01 0.76 - 1.27 mg/dL Final    Comment:                   **Effective May 04, 2020 [facility] will begin**                  reporting the 2021 CKD-EPI creatinine equation that                  estimates kidney function without a race  variable.           Passed - K in normal range and within 180 days    Potassium  Date Value Ref Range Status  04/30/2020 4.1 3.5 - 5.2 mmol/L Final          Passed - Patient is not pregnant

## 2020-10-27 ENCOUNTER — Other Ambulatory Visit: Payer: Self-pay | Admitting: Family Medicine

## 2020-10-27 DIAGNOSIS — E039 Hypothyroidism, unspecified: Secondary | ICD-10-CM

## 2020-10-27 DIAGNOSIS — E782 Mixed hyperlipidemia: Secondary | ICD-10-CM

## 2020-10-27 NOTE — Telephone Encounter (Signed)
Requested medications are due for refill today.  Yes  Requested medications are on the active medications list.  yes  Last refill. 04/30/2020 & 11/05/2019  Future visit scheduled.   no  Notes to clinic.  Last thyroid test was abnormal and needs to be repeated.  Last office visit was 11/05/2019. Abnormal LDL and HDL.

## 2020-12-21 ENCOUNTER — Other Ambulatory Visit: Payer: Self-pay | Admitting: Family

## 2020-12-21 DIAGNOSIS — I10 Essential (primary) hypertension: Secondary | ICD-10-CM

## 2021-04-26 ENCOUNTER — Other Ambulatory Visit: Payer: Self-pay | Admitting: Family Medicine

## 2021-04-26 DIAGNOSIS — I10 Essential (primary) hypertension: Secondary | ICD-10-CM

## 2021-04-27 NOTE — Telephone Encounter (Signed)
Requested medication (s) are due for refill today - yes  Requested medication (s) are on the active medication list -yes  Future visit scheduled -no  Last refill: 10/22/20 #90 1RF  Notes to clinic: Attempted to call patient to schedule appointment- left message on VM to call office. [place] lab, visit protocol- 1 year  Requested Prescriptions  Pending Prescriptions Disp Refills   losartan (COZAAR) 100 MG tablet [Pharmacy Med Name: LOSARTAN POTASSIUM 100 MG TAB] 90 tablet 1    Sig: TAKE 1 TABLET BY MOUTH EVERY DAY     Cardiovascular:  Angiotensin Receptor Blockers Failed - 04/26/2021  1:41 AM      Failed - Cr in normal range and within 180 days    Creat  Date Value Ref Range Status  02/05/2020 1.20 0.70 - 1.25 mg/dL Final    Comment:    For patients >68 years of age, the reference limit for Creatinine is approximately 13% higher for people identified as African-American. .    Creatinine, Ser  Date Value Ref Range Status  04/30/2020 1.01 0.76 - 1.27 mg/dL Final    Comment:                   **Effective May 04, 2020 [facility] will begin**                  reporting the 2021 CKD-EPI creatinine equation that                  estimates kidney function without a race variable.           Failed - K in normal range and within 180 days    Potassium  Date Value Ref Range Status  04/30/2020 4.1 3.5 - 5.2 mmol/L Final          Failed - Last BP in normal range    BP Readings from Last 1 Encounters:  06/11/20 140/82          Failed - Valid encounter within last 6 months    Recent Outpatient Visits           1 year ago Abscess of skin of right shoulder   [facility], DO   1 year ago Benign prostatic hyperplasia with urinary hesitancy   [place], DO   1 year ago Benign prostatic hyperplasia with incomplete bladder emptying   [facility] Olin Hauser, DO   2  years ago Essential hypertension   [place], Devonne Doughty, DO   3 years ago Annual physical exam   [place], Devonne Doughty, DO              Passed - Patient is not pregnant       amLODipine (NORVASC) 10 MG tablet [Pharmacy Med Name: AMLODIPINE BESYLATE 10 MG TAB] 90 tablet 1    Sig: TAKE 1 TABLET BY MOUTH EVERY DAY     Cardiovascular: Calcium Channel Blockers 2 Failed - 04/26/2021  1:41 AM      Failed - Last BP in normal range    BP Readings from Last 1 Encounters:  06/11/20 140/82          Failed - Valid encounter within last 6 months    Recent Outpatient Visits           1 year ago Abscess of skin of right shoulder   [facility] Olin [facility]er, DO   1 year ago Benign prostatic hyperplasia with urinary hesitancy   [place], DO   1 year ago Benign prostatic hyperplasia with incomplete bladder emptying   [place], Devonne Doughty, DO   2 years ago Essential hypertension   [place], Devonne Doughty, DO   3 years ago Annual physical exam   [place], DO              Passed - Last Heart Rate in normal range    Pulse Readings from Last 1 Encounters:  06/11/20 (!) 58             Requested Prescriptions  Pending Prescriptions Disp Refills   losartan (COZAAR) 100 MG tablet [Pharmacy Med Name: LOSARTAN POTASSIUM 100 MG TAB] 90 tablet 1    Sig: TAKE 1 TABLET BY MOUTH EVERY DAY     Cardiovascular:  Angiotensin Receptor Blockers Failed - 04/26/2021  1:41 AM      Failed - Cr in normal range and within 180 days    Creat  Date Value Ref Range Status  02/05/2020 1.20 0.70 - 1.25 mg/dL Final    Comment:    For patients >16 years of age, the reference limit for Creatinine is approximately 13% higher for people identified as African-American. .     Creatinine, Ser  Date Value Ref Range Status  04/30/2020 1.01 0.76 - 1.27 mg/dL Final    Comment:                   **Effective May 04, 2020 [facility] will begin**                  reporting the 2021 CKD-EPI creatinine equation that                  estimates kidney function without a race variable.           Failed - K in normal range and within 180 days    Potassium  Date Value Ref Range Status  04/30/2020 4.1 3.5 - 5.2 mmol/L Final          Failed - Last BP in normal range    BP Readings from Last 1 Encounters:  06/11/20 140/82          Failed - Valid encounter within last 6 months    Recent Outpatient Visits           1 year ago Abscess of skin of right shoulder   [place], DO   1 year ago Benign prostatic hyperplasia with urinary hesitancy   [place], DO   1 year ago Benign prostatic hyperplasia with incomplete bladder emptying   [facility] Olin Hauser, DO   2 years ago Essential hypertension   [place], Devonne Doughty, DO   3 years ago Annual physical exam   [place], Devonne Doughty, DO              Passed - Patient is not pregnant       amLODipine (NORVASC) 10 MG tablet [Pharmacy Med Name: AMLODIPINE BESYLATE 10 MG TAB] 90 tablet 1    Sig: TAKE 1 TABLET BY MOUTH EVERY DAY     Cardiovascular: Calcium Channel Blockers 2 Failed - 04/26/2021  1:41  AM      Failed - Last BP in normal range    BP Readings from Last 1 Encounters:  06/11/20 140/82          Failed - Valid encounter within last 6 months    Recent Outpatient Visits           1 year ago Abscess of skin of right shoulder   [place], DO   1 year ago Benign prostatic hyperplasia with urinary hesitancy   [place], DO   1 year ago  Benign prostatic hyperplasia with incomplete bladder emptying   [facility] Olin Hauser, DO   2 years ago Essential hypertension   [place], DO   3 years ago Annual physical exam   [place], DO              Passed - Last Heart Rate in normal range    Pulse Readings from Last 1 Encounters:  06/11/20 (!) 58

## 2021-04-29 ENCOUNTER — Other Ambulatory Visit: Payer: Self-pay

## 2021-04-29 ENCOUNTER — Ambulatory Visit (INDEPENDENT_AMBULATORY_CARE_PROVIDER_SITE_OTHER): Payer: 59 | Admitting: Family Medicine

## 2021-04-29 ENCOUNTER — Encounter: Payer: Self-pay | Admitting: Family Medicine

## 2021-04-29 VITALS — BP 154/88 | HR 60 | Ht 67.0 in | Wt 219.8 lb

## 2021-04-29 DIAGNOSIS — I10 Essential (primary) hypertension: Secondary | ICD-10-CM

## 2021-04-29 DIAGNOSIS — E669 Obesity, unspecified: Secondary | ICD-10-CM

## 2021-04-29 DIAGNOSIS — E039 Hypothyroidism, unspecified: Secondary | ICD-10-CM

## 2021-04-29 DIAGNOSIS — N401 Enlarged prostate with lower urinary tract symptoms: Secondary | ICD-10-CM

## 2021-04-29 DIAGNOSIS — N529 Male erectile dysfunction, unspecified: Secondary | ICD-10-CM

## 2021-04-29 DIAGNOSIS — J3089 Other allergic rhinitis: Secondary | ICD-10-CM

## 2021-04-29 DIAGNOSIS — R3911 Hesitancy of micturition: Secondary | ICD-10-CM

## 2021-04-29 DIAGNOSIS — R7303 Prediabetes: Secondary | ICD-10-CM

## 2021-04-29 DIAGNOSIS — Z125 Encounter for screening for malignant neoplasm of prostate: Secondary | ICD-10-CM

## 2021-04-29 DIAGNOSIS — R29818 Other symptoms and signs involving the nervous system: Secondary | ICD-10-CM

## 2021-04-29 DIAGNOSIS — I422 Other hypertrophic cardiomyopathy: Secondary | ICD-10-CM

## 2021-04-29 DIAGNOSIS — C61 Malignant neoplasm of prostate: Secondary | ICD-10-CM

## 2021-04-29 DIAGNOSIS — Z0000 Encounter for general adult medical examination without abnormal findings: Secondary | ICD-10-CM | POA: Diagnosis not present

## 2021-04-29 DIAGNOSIS — E782 Mixed hyperlipidemia: Secondary | ICD-10-CM

## 2021-04-29 MED ORDER — MONTELUKAST SODIUM 10 MG PO TABS: 10.0000 mg | ORAL_TABLET | Freq: Every day | ORAL | 1 refills | Status: DC

## 2021-04-29 MED ORDER — SPIRONOLACTONE 25 MG PO TABS: 25.0000 mg | ORAL_TABLET | Freq: Every day | ORAL | 1 refills | Status: DC

## 2021-04-29 MED ORDER — LEVOTHYROXINE SODIUM 175 MCG PO TABS: 175.0000 ug | ORAL_TABLET | Freq: Every day | ORAL | 1 refills | Status: DC

## 2021-04-29 MED ORDER — SILDENAFIL CITRATE 20 MG PO TABS: ORAL_TABLET | ORAL | 5 refills | Status: AC

## 2021-04-29 MED ORDER — ROSUVASTATIN CALCIUM 10 MG PO TABS: 10.0000 mg | ORAL_TABLET | Freq: Every day | ORAL | 1 refills | Status: DC

## 2021-04-29 MED ORDER — HYDRALAZINE HCL 25 MG PO TABS
25.0000 mg | ORAL_TABLET | Freq: Two times a day (BID) | ORAL | 1 refills | Status: DC
Start: 2021-04-29 — End: 2021-11-03

## 2021-04-29 NOTE — Patient Instructions (Addendum)
Thank you for coming to the office today.  Restart Hydralazine twice a day and Spironolactone.  All meds ordered for 90 day with 1 refill for total 6 months.  I do believe you will need a CPAP therapy in order to fix the blood pressure.  Last time it was not covered, we will order again.  [facility] Address: [address] [place], [place], [place] [address] Phone: [phone]  Referral sent via fax today. Stay tuned to hear back from them, they should call you to schedule initial sleep study, likely a home test and then if abnormal - they will arrange the 2nd part of the sleep study in the lab with the CPAP machine.  If there is any issue with this company, for example not covered by insurance or other problem, please notify us and they may do so a well - we will need to change the location of the referral.   Please schedule a Follow-up Appointment to: Return in about 6 months (around 10/27/2021) for 6 month follow-up HTN, OSA?Marland Kitchen  If you have any other questions or concerns, please feel free to call the office or send a message through [facility]. You may also schedule an earlier appointment if necessary.  Additionally, you may be receiving a survey about your experience at our office within a few days to 1 week by e-mail or mail. We value your feedback.  [name], DO [place]

## 2021-04-29 NOTE — Progress Notes (Signed)
Subjective:    Patient ID: Todd Burns, male    DOB: [birth year], 61 y.o.   MRN: [id number]  Todd Burns is a 61 y.o. male presenting on 04/29/2021 for Hypothyroidism and Hypertension   HPI  Here for Annual and Lab Review.   CHRONIC HTN: Suspected OSA  Admits trying to limit sodium in diet - Previously 2021 attempted to order sleep study, but was not covered, high out of pocket cost to him he could not pursue  Cardiology - deferred BB due to [place]. They considered more potent ARB. Continued Spironolactone 75m daily, Amlodipine 133mdaily, Losartan 10073maily.  He was started on Hydralazine 98m61mD  Currently off Spironolactone and Hydralazine  Chronic issue poorly managed BP. He checks his BP regularly still elevated Previously was ordered PSG but he could not afford and it was declined and order has now expired  Reports good compliance, took meds today. Tolerating well, w/o complaints.  Admits some life stressors still present Denies CP, dyspnea, HA, edema, dizziness / lightheadedness   Pre-Diabetes: Reports in past high sugar 5.9, now has improved Last lab A1c 5.7 Meds: none Currently on ARB Denies hypoglycemia, polyuria, visual changes, numbness or tingling.   HYPERLIPIDEMIA: Last lipid panel 2022 previously uncontrolled elevated LDL 161, elevated total cholesterol Lifestyle - Diet: not always adhering to low cholesterol diet   BPH Elevated PSA Erectile dysfunction   Previously improved on medicines. Now insurance not covering Finasteride On Tamsulosin Inadequate results Previously seen Dr StoiBernardo Heater9 at [facility] Lab now shows increasing PSA trend to 4.0, previously 2.7 to 3.2 No fam history prostate CA Has worsening erectile dysfunction, asking about starting med   Hypothyroidism Chronic problem, previous TSH normal. Continues Levothyroxine 175mc57mily  Epworth Sleepiness Scale Total Score: 12 Sitting and reading - 1 Watching TV - 2 Sitting  inactive in a public place - 2 As a passenger in a car for an hour without a break - 2 Lying down to rest in the afternoon when circumstances permit - 3 Sitting and talking to someone - 0 Sitting quietly after a lunch without alcohol - 2 In a car, while stopped for a few minutes in traffic - 0   STOP-Bang OSA scoring Snoring yes    Tiredness yes    Observed apneas yes    Pressure HTN yes    BMI > 35 kg/m2 no    Age > 50  y30    Neck (male >17 in; Male >16 in)  yes 18"  61nder male yes    OSA risk low (0-2)  OSA risk intermediate (3-4)  OSA risk high (5+)   Total: 7 High Risk       Health Maintenance: Declines Shingrix, TDap Flu Shot done 12/2020  Depression screen [facility] 11/05/2019 08/02/2018  Decreased Interest 0 0 0  Down, Depressed, Hopeless 0 0 0  PHQ - 2 Score 0 0 0  Altered sleeping 1 - -  Tired, decreased energy 0 - -  Change in appetite 0 - -  Feeling bad or failure about yourself  0 - -  Trouble concentrating 0 - -  Moving slowly or fidgety/restless 0 - -  Suicidal thoughts 0 - -  PHQ-9 Score 1 - -  Difficult doing work/chores Not difficult at all - -    Past Medical History:  Diagnosis Date   BPH (benign prostatic hyperplasia)    GERD (gastroesophageal reflux disease)    Hypertension    Hypothyroidism  LVH (left ventricular hypertrophy)    suspicous for hypertrophic cardiomyopathy   Pre-diabetes    Tear of MCL (medial collateral ligament) of knee    Past Surgical History:  Procedure Laterality Date   COLONOSCOPY N/A 08/08/2014   Procedure: COLONOSCOPY;  Surgeon: Lucilla Lame, MD;  Location: [place];  Service: Gastroenterology;  Laterality: N/A;   HOLEP-LASER ENUCLEATION OF THE PROSTATE WITH MORCELLATION N/A 01/24/2020   Procedure: HOLEP-LASER ENUCLEATION OF THE PROSTATE WITH MORCELLATION;  Surgeon: Billey Co, MD;  Location: [facility];  Service: Urology;  Laterality: N/A;   MEDIAL COLLATERAL LIGAMENT REPAIR, KNEE  1999    SHOULDER ARTHROSCOPY WITH ROTATOR CUFF REPAIR Right 10/01/2014   Procedure: SHOULDER ARTHROSCOPY WITH ROTATOR CUFF REPAIR;  Surgeon: Earnestine Leys, MD;  Location: [facility];  Service: Orthopedics;  Laterality: Right;  Mini open rotator cuff repair Arthroscopy  Distal clavicle excision Subacromial decompression   Social History   Socioeconomic History   Marital status: Married    Spouse name: Not on file   Number of children: Not on file   Years of education: Not on file   Highest education level: Not on file  Occupational History   Occupation: Radiographer, therapeutic  Tobacco Use   Smoking status: Every Day    Packs/day: 1.00    Years: 15.00    Pack years: 15.00    Types: Cigarettes   Smokeless tobacco: Never  Vaping Use   Vaping Use: Former  Substance and Sexual Activity   Alcohol use: Not Currently    Comment: Drinks every month or two   Drug use: No   Sexual activity: Yes  Other Topics Concern   Not on file  Social History Narrative   Not on file   Social Determinants of Health   Financial Resource Strain: Not on file  Food Insecurity: Not on file  Transportation Needs: Not on file  Physical Activity: Not on file  Stress: Not on file  Social Connections: Not on file  Intimate Partner Violence: Not on file   Family History  Problem Relation Age of Onset   Cancer Mother        thyroid   Cancer Father        lung   Thyroid disease Brother    Cancer Maternal Aunt        thyroid   Cancer Sister    Thyroid disease Brother    Cancer Sister    Current Outpatient Medications on File Prior to Visit  Medication Sig   amLODipine (NORVASC) 10 MG tablet TAKE 1 TABLET BY MOUTH EVERY DAY   fluticasone (FLONASE) 50 MCG/ACT nasal spray Place 2 sprays into both nostrils daily. Use for 4-6 weeks then stop and use seasonally or as needed.   losartan (COZAAR) 100 MG tablet TAKE 1 TABLET BY MOUTH EVERY DAY   naproxen (NAPROSYN) 500 MG tablet Take 500 mg by mouth 2 (two) times daily  with a meal. As needed   omeprazole (PRILOSEC) 40 MG capsule Take 1 capsule (40 mg total) by mouth daily before breakfast. 4-6 weeks then can taper down off med   triamcinolone cream (KENALOG) 0.1 % Apply 1 application topically 2 (two) times daily. As needed for bug bites or itching   No current facility-administered medications on file prior to visit.    Review of Systems Per HPI unless specifically indicated above      Objective:    BP (!) 154/88 (BP Location: Left Arm, Cuff Size: Normal)  Pulse 60    Ht _0  (1.702 m)    Wt 219 lb 12.8 oz (99.7 kg)    SpO2 98%    BMI 34.43 kg/m   Wt Readings from Last 3 Encounters:  04/29/21 219 lb 12.8 oz (99.7 kg)  06/11/20 214 lb 8 oz (97.3 kg)  04/30/20 218 lb (98.9 kg)    Physical Exam Vitals and nursing note reviewed.  Constitutional:      General: He is not in acute distress.    Appearance: He is well-developed. He is not diaphoretic.     Comments: Well-appearing, comfortable, cooperative  HENT:     Head: Normocephalic and atraumatic.  Eyes:     General:        Right eye: No discharge.        Left eye: No discharge.     Conjunctiva/sclera: Conjunctivae normal.  Neck:     Thyroid: No thyromegaly.  Cardiovascular:     Rate and Rhythm: Normal rate and regular rhythm.     Pulses: Normal pulses.     Heart sounds: Normal heart sounds. No murmur heard. Pulmonary:     Effort: Pulmonary effort is normal. No respiratory distress.     Breath sounds: Normal breath sounds. No wheezing or rales.  Musculoskeletal:        General: Normal range of motion.     Cervical back: Normal range of motion and neck supple.  Lymphadenopathy:     Cervical: No cervical adenopathy.  Skin:    General: Skin is warm and dry.     Findings: No erythema or rash.  Neurological:     Mental Status: He is alert and oriented to person, place, and time. Mental status is at baseline.  Psychiatric:        Behavior: Behavior normal.     Comments: Well groomed,  good eye contact, normal speech and thoughts     Results for orders placed or performed in visit on 04/29/21  COMPLETE METABOLIC PANEL WITH GFR  Result Value Ref Range   Glucose, Bld 85 65 - 99 mg/dL   BUN 14 7 - 25 mg/dL   Creat 1.22 0.70 - 1.35 mg/dL   eGFR 67 > OR = 60 mL/min/1.16m   BUN/Creatinine Ratio NOT APPLICABLE 6 - 22 (calc)   Sodium 137 135 - 146 mmol/L   Potassium 4.1 3.5 - 5.3 mmol/L   Chloride 106 98 - 110 mmol/L   CO2 24 20 - 32 mmol/L   Calcium 9.2 8.6 - 10.3 mg/dL   Total Protein 7.3 6.1 - 8.1 g/dL   Albumin 4.2 3.6 - 5.1 g/dL   Globulin 3.1 1.9 - 3.7 g/dL (calc)   AG Ratio 1.4 1.0 - 2.5 (calc)   Total Bilirubin 0.7 0.2 - 1.2 mg/dL   Alkaline phosphatase (APISO) 52 35 - 144 U/L   AST 19 10 - 35 U/L   ALT 10 9 - 46 U/L  Lipid panel  Result Value Ref Range   Cholesterol 236 (H) <200 mg/dL   HDL 44 > OR = 40 mg/dL   Triglycerides 104 <150 mg/dL   LDL Cholesterol (Calc) 169 (H) mg/dL (calc)   Total CHOL/HDL Ratio 5.4 (H) <5.0 (calc)   Non-HDL Cholesterol (Calc) 192 (H) <130 mg/dL (calc)  CBC with Differential/Platelet  Result Value Ref Range   WBC 6.5 3.8 - 10.8 Thousand/uL   RBC 4.92 4.20 - 5.80 Million/uL   Hemoglobin 14.1 13.2 - 17.1 g/dL   HCT 41.8 38.5 -  50.0 %   MCV 85.0 80.0 - 100.0 fL   MCH 28.7 27.0 - 33.0 pg   MCHC 33.7 32.0 - 36.0 g/dL   RDW 16.2 (H) 11.0 - 15.0 %   Platelets 209 140 - 400 Thousand/uL   MPV 11.3 7.5 - 12.5 fL   Neutro Abs 3,322 1,500 - 7,800 cells/uL   Lymphs Abs 2,438 850 - 3,900 cells/uL   Absolute Monocytes 553 200 - 950 cells/uL   Eosinophils Absolute 111 15 - 500 cells/uL   Basophils Absolute 78 0 - 200 cells/uL   Neutrophils Relative % 51.1 %   Total Lymphocyte 37.5 %   Monocytes Relative 8.5 %   Eosinophils Relative 1.7 %   Basophils Relative 1.2 %  Hemoglobin A1c  Result Value Ref Range   Hgb A1c MFr Bld 5.8 (H) <5.7 % of total Hgb   Mean Plasma Glucose 120 mg/dL   eAG (mmol/L) 6.6 mmol/L  PSA  Result  Value Ref Range   PSA 0.83 < OR = 4.00 ng/mL  TSH  Result Value Ref Range   TSH 18.90 (H) 0.40 - 4.50 mIU/L  T4, free  Result Value Ref Range   Free T4 0.9 0.8 - 1.8 ng/dL      Assessment & Plan:   Problem List Items Addressed This Visit     Suspected sleep apnea   Prostate cancer ([place])   Relevant Orders   PSA (Completed)   Pre-diabetes   Relevant Orders   Hemoglobin A1c (Completed)   Obesity (BMI 30.0-34.9)   Mixed hyperlipidemia   Relevant Medications   rosuvastatin (CRESTOR) 10 MG tablet   spironolactone (ALDACTONE) 25 MG tablet   hydrALAZINE (APRESOLINE) 25 MG tablet   sildenafil (REVATIO) 20 MG tablet   Other Relevant Orders   COMPLETE METABOLIC PANEL WITH GFR (Completed)   Lipid panel (Completed)   Hypothyroidism   Relevant Medications   levothyroxine (SYNTHROID) 175 MCG tablet   Other Relevant Orders   TSH (Completed)   T4, free (Completed)   Hypertrophic cardiomyopathy (HCC)   Relevant Medications   rosuvastatin (CRESTOR) 10 MG tablet   spironolactone (ALDACTONE) 25 MG tablet   hydrALAZINE (APRESOLINE) 25 MG tablet   sildenafil (REVATIO) 20 MG tablet   Essential hypertension   Relevant Medications   rosuvastatin (CRESTOR) 10 MG tablet   spironolactone (ALDACTONE) 25 MG tablet   hydrALAZINE (APRESOLINE) 25 MG tablet   sildenafil (REVATIO) 20 MG tablet   Other Relevant Orders   COMPLETE METABOLIC PANEL WITH GFR (Completed)   CBC with Differential/Platelet (Completed)   BPH (benign prostatic hyperplasia)   Relevant Orders   PSA (Completed)   Other Visit Diagnoses     Annual physical exam    -  Primary   Relevant Orders   COMPLETE METABOLIC PANEL WITH GFR (Completed)   Lipid panel (Completed)   CBC with Differential/Platelet (Completed)   Hemoglobin A1c (Completed)   PSA (Completed)   Screening for prostate cancer       Uncontrolled hypertension       Relevant Medications   rosuvastatin (CRESTOR) 10 MG tablet   spironolactone (ALDACTONE) 25 MG  tablet   hydrALAZINE (APRESOLINE) 25 MG tablet   sildenafil (REVATIO) 20 MG tablet   Environmental and seasonal allergies       Relevant Medications   montelukast (SINGULAIR) 10 MG tablet   Erectile dysfunction, unspecified erectile dysfunction type       Relevant Medications   sildenafil (REVATIO) 20 MG tablet  Updated Health Maintenance information Fasting labs today Encouraged improvement to lifestyle with diet and exercise Goal of weight loss  Elevated TSH but nearly normal T4 Continue current Levothyroxine 175  PreDM A1c 5.8, stable Improve lifestyle  Prostate CA Hx PSA negative, surveillance  HTN Need to restart meds. Restart Hydralazine twice a day and Spironolactone. May consider [facility] for Resistant HTN Clinic Pursue OSA >  All meds ordered for 90 day with 1 refill for total 6 months.  Persistent clinical concern for suspected obstructive sleep apnea given reported symptoms with snoring and sleep disturbance, fatigue excessive sleepiness. - Screening: ESS score 12 / STOP-Bang Score 7 - Neck Circumference: >17" - Co-morbidities: Resistant HTN, Obesity  Plan: 1. Discussion on initial diagnosis and testing for OSA, risk factors, management, complications 2. Agree to proceed with sleep study testing based on clinical concerns - will fax referral to [place] - to arrange initial PSG home vs in sleep lab and further evaluation.   Meds ordered this encounter  Medications   levothyroxine (SYNTHROID) 175 MCG tablet    Sig: Take 1 tablet (175 mcg total) by mouth daily before breakfast.    Dispense:  90 tablet    Refill:  1   rosuvastatin (CRESTOR) 10 MG tablet    Sig: Take 1 tablet (10 mg total) by mouth at bedtime.    Dispense:  90 tablet    Refill:  1   spironolactone (ALDACTONE) 25 MG tablet    Sig: Take 1 tablet (25 mg total) by mouth daily.    Dispense:  90 tablet    Refill:  1   montelukast (SINGULAIR) 10 MG tablet    Sig:  Take 1 tablet (10 mg total) by mouth at bedtime.    Dispense:  90 tablet    Refill:  1   hydrALAZINE (APRESOLINE) 25 MG tablet    Sig: Take 1 tablet (25 mg total) by mouth in the morning and at bedtime.    Dispense:  180 tablet    Refill:  1   sildenafil (REVATIO) 20 MG tablet    Sig: Take 1-5 pills about 60 min prior to sex. Start with 1 and increase as needed.    Dispense:  90 tablet    Refill:  5      Follow up plan: Return in about 6 months (around 10/27/2021) for 6 month follow-up HTN, OSA?Marland Kitchen  [name], [facility] 04/29/2021, 9:34 AM

## 2021-04-30 LAB — COMPLETE METABOLIC PANEL WITH GFR
AG Ratio: 1.4 (calc) (ref 1.0–2.5)
ALT: 10 U/L (ref 9–46)
AST: 19 U/L (ref 10–35)
Albumin: 4.2 g/dL (ref 3.6–5.1)
Alkaline phosphatase (APISO): 52 U/L (ref 35–144)
BUN: 14 mg/dL (ref 7–25)
CO2: 24 mmol/L (ref 20–32)
Calcium: 9.2 mg/dL (ref 8.6–10.3)
Chloride: 106 mmol/L (ref 98–110)
Creat: 1.22 mg/dL (ref 0.70–1.35)
Globulin: 3.1 g/dL (calc) (ref 1.9–3.7)
Glucose, Bld: 85 mg/dL (ref 65–99)
Potassium: 4.1 mmol/L (ref 3.5–5.3)
Sodium: 137 mmol/L (ref 135–146)
Total Bilirubin: 0.7 mg/dL (ref 0.2–1.2)
Total Protein: 7.3 g/dL (ref 6.1–8.1)
eGFR: 67 mL/min/{1.73_m2} (ref >=60)

## 2021-04-30 LAB — CBC WITH DIFFERENTIAL/PLATELET
Absolute Monocytes: 553 cells/uL (ref 200–950)
Basophils Absolute: 78 cells/uL (ref 0–200)
Basophils Relative: 1.2 %
Eosinophils Absolute: 111 cells/uL (ref 15–500)
Eosinophils Relative: 1.7 %
HCT: 41.8 % (ref 38.5–50.0)
Hemoglobin: 14.1 g/dL (ref 13.2–17.1)
Lymphs Abs: 2438 cells/uL (ref 850–3900)
MCH: 28.7 pg (ref 27.0–33.0)
MCHC: 33.7 g/dL (ref 32.0–36.0)
MCV: 85 fL (ref 80.0–100.0)
MPV: 11.3 fL (ref 7.5–12.5)
Monocytes Relative: 8.5 %
Neutro Abs: 3322 cells/uL (ref 1500–7800)
Neutrophils Relative %: 51.1 %
Platelets: 209 10*3/uL (ref 140–400)
RBC: 4.92 10*6/uL (ref 4.20–5.80)
RDW: 16.2 % — ABNORMAL HIGH (ref 11.0–15.0)
Total Lymphocyte: 37.5 %
WBC: 6.5 10*3/uL (ref 3.8–10.8)

## 2021-04-30 LAB — PSA: PSA: 0.83 ng/mL (ref <=4.00)

## 2021-04-30 LAB — LIPID PANEL
Cholesterol: 236 mg/dL — ABNORMAL HIGH (ref <200)
HDL: 44 mg/dL (ref >=40)
LDL Cholesterol (Calc): 169 mg/dL (calc) — ABNORMAL HIGH
Non-HDL Cholesterol (Calc): 192 mg/dL (calc) — ABNORMAL HIGH (ref <130)
Total CHOL/HDL Ratio: 5.4 (calc) — ABNORMAL HIGH (ref <5.0)
Triglycerides: 104 mg/dL (ref <150)

## 2021-04-30 LAB — T4, FREE: Free T4: 0.9 ng/dL (ref 0.8–1.8)

## 2021-04-30 LAB — HEMOGLOBIN A1C
Hgb A1c MFr Bld: 5.8 % of total Hgb — ABNORMAL HIGH (ref <5.7)
Mean Plasma Glucose: 120 mg/dL
eAG (mmol/L): 6.6 mmol/L

## 2021-04-30 LAB — TSH: TSH: 18.9 mIU/L — ABNORMAL HIGH (ref 0.40–4.50)

## 2021-05-02 IMAGING — CT CT CHEST W/ CM
2 of 3 series · 15 of 36 positions shown, 18 images · IV contrast (omnipaque)
Comparison: None.

CLINICAL DATA: Right chest wall mass

EXAM:
CT CHEST WITH CONTRAST
TECHNIQUE: Multidetector CT imaging of the chest was performed during
intravenous contrast administration.
CONTRAST:  75mL OMNIPAQUE IOHEXOL 300 MG/ML  SOLN

[Series 2: axial st · axial · 0.93mm/px · z∈[-534,-244]mm · 12 of 171 slices shown, 15 images]
[im 13/171  mediastinal]
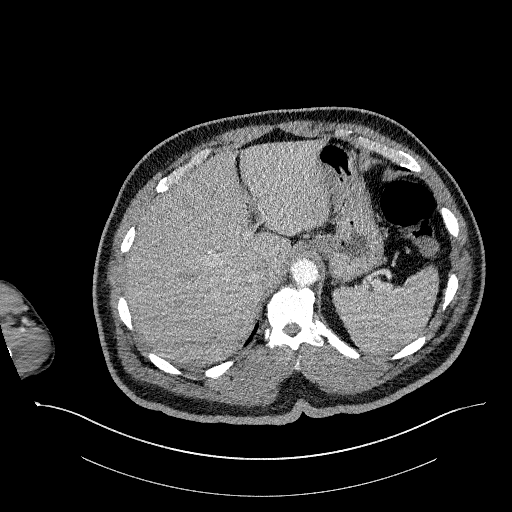
[im 13/171  lung]
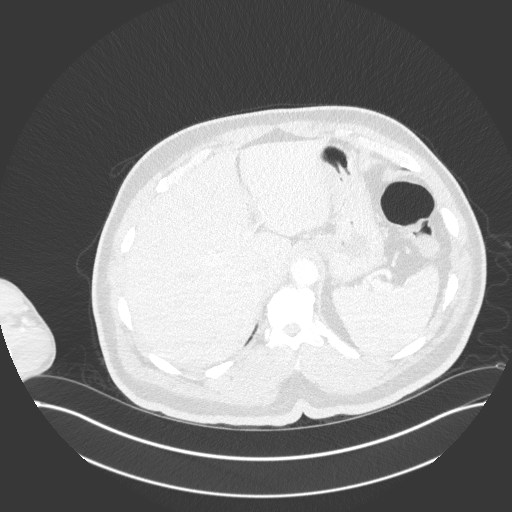
[im 26/171  lung]
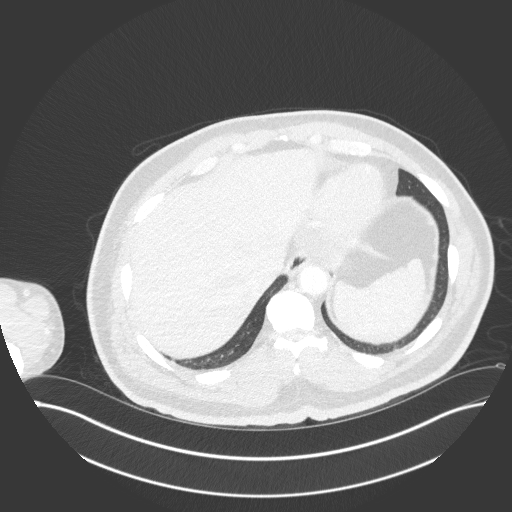
[im 38/171  lung]
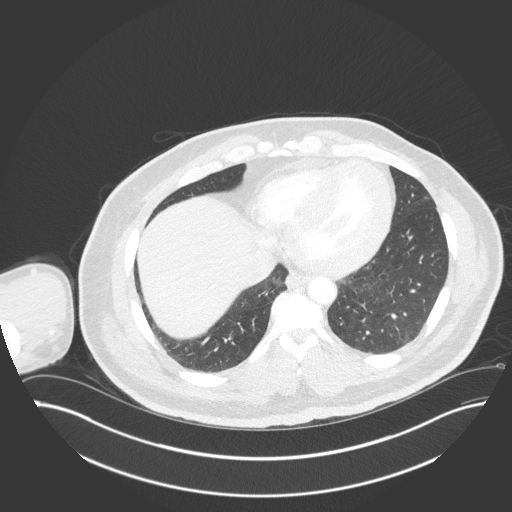
[im 51/171  lung]
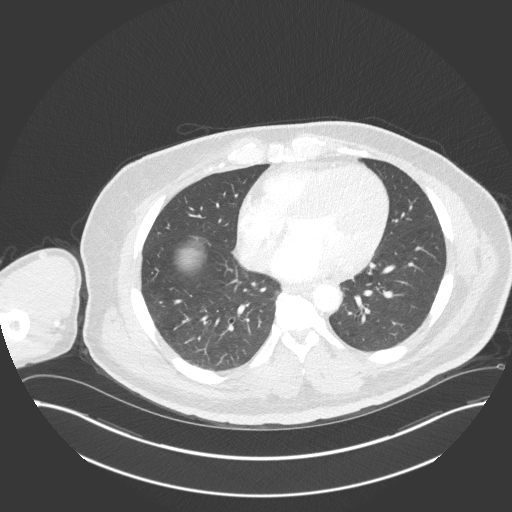
[im 63/171  mediastinal]
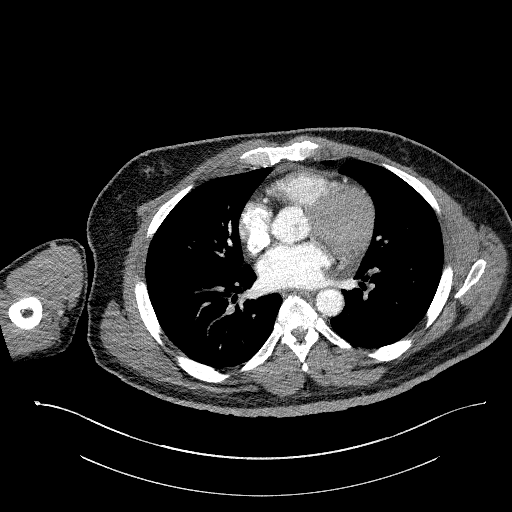
[im 63/171  lung]
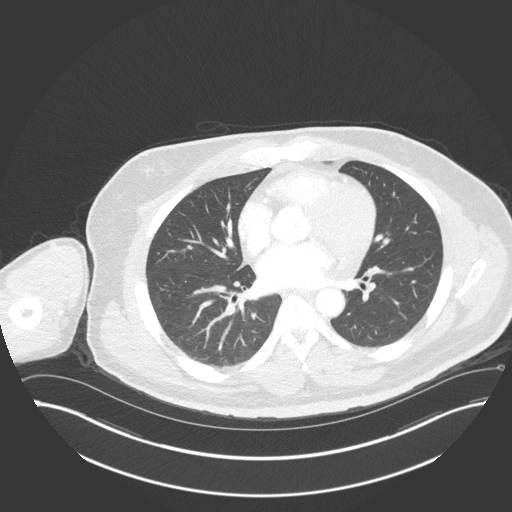
[im 76/171  lung]
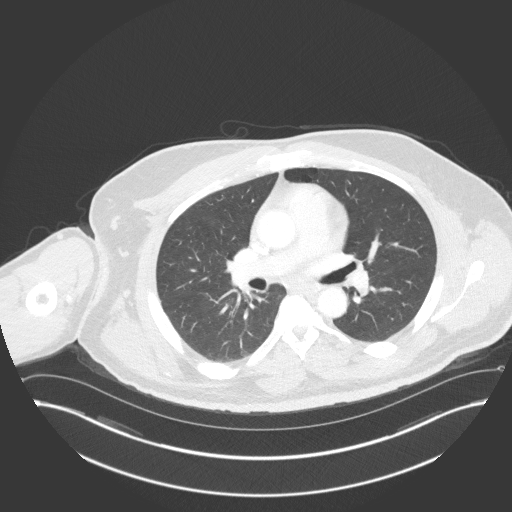
[im 95/171  lung]
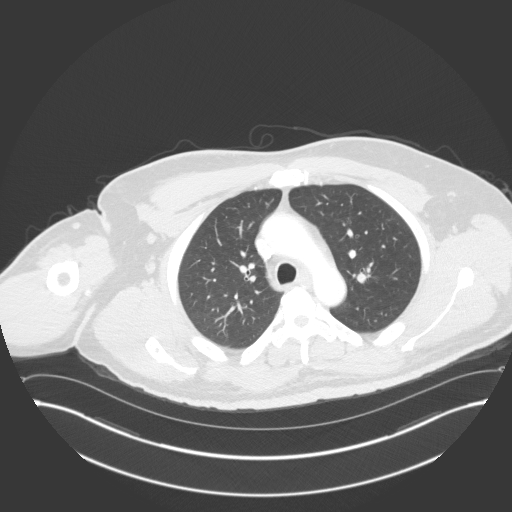
[im 108/171  lung]
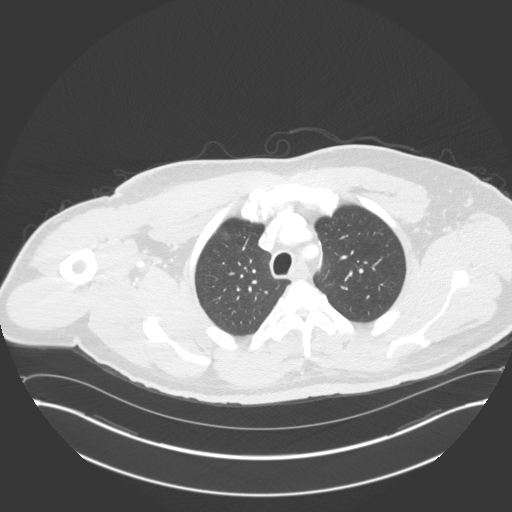
[im 120/171  mediastinal]
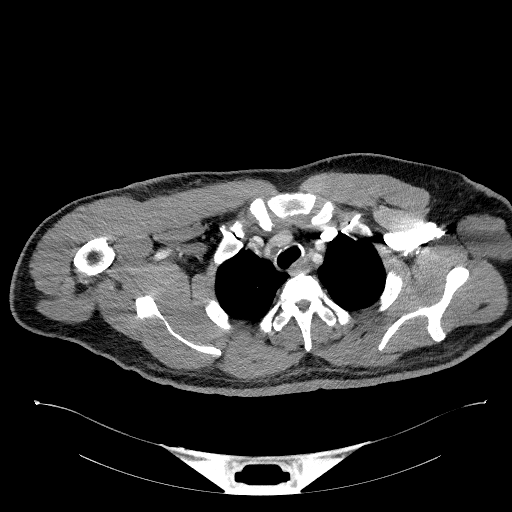
[im 120/171  lung]
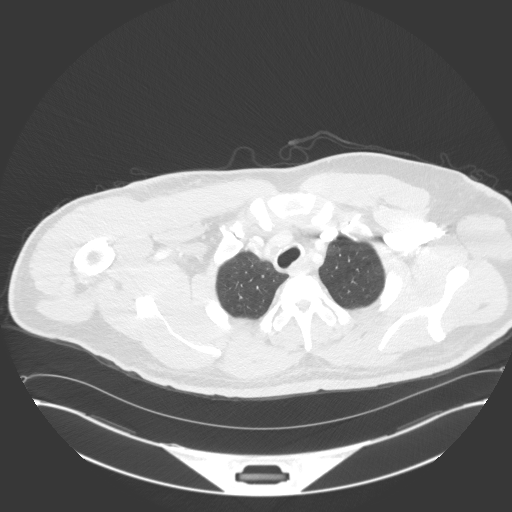
[im 133/171  lung]
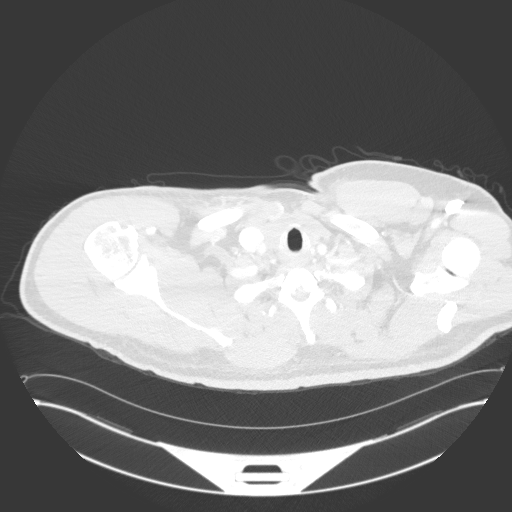
[im 145/171  lung]
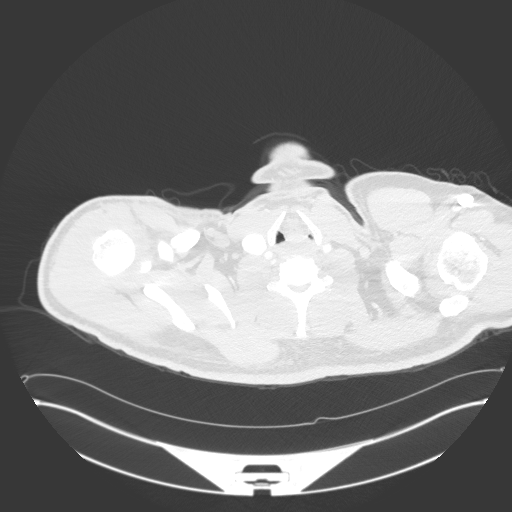
[im 158/171  lung]
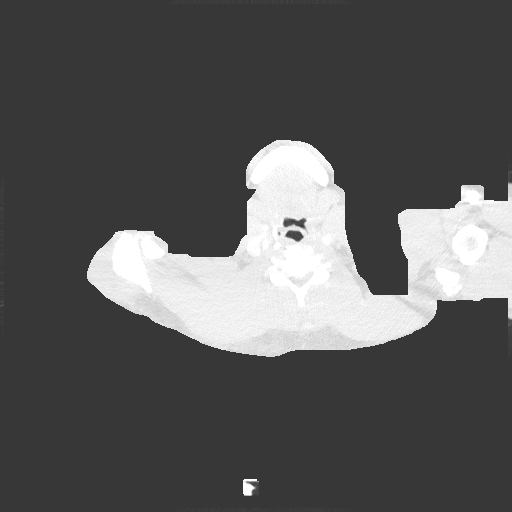

[Series 5: coronal · coronal · 0.70mm/px · 3 of 133 slices shown]
[im 27/133  lung]
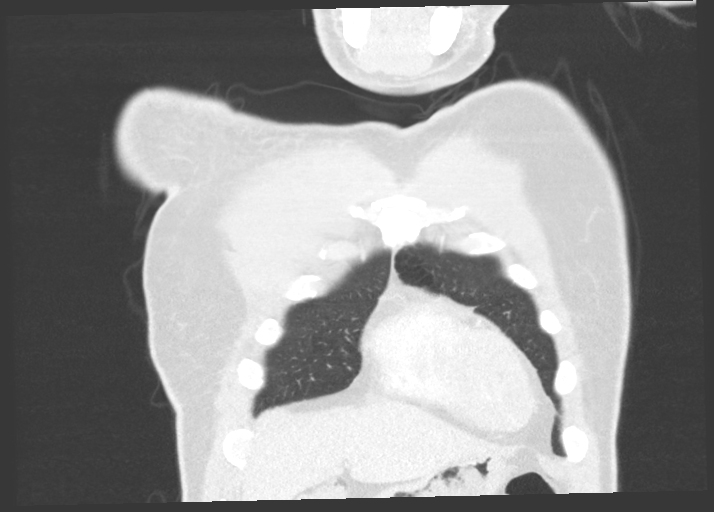
[im 53/133  lung]
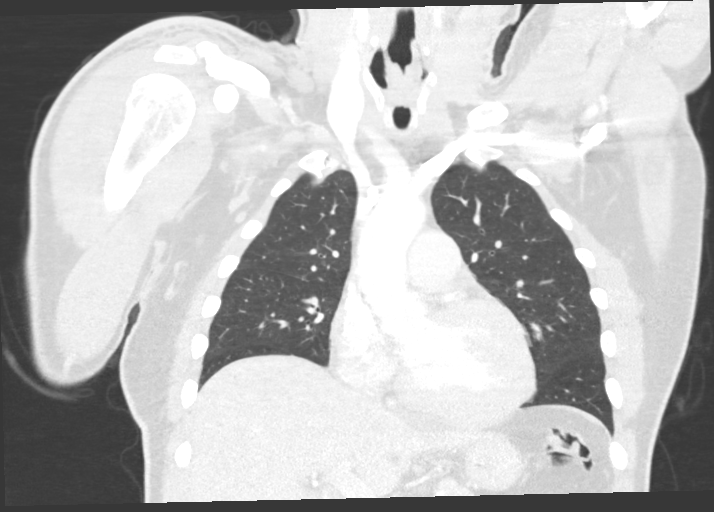
[im 80/133  lung]
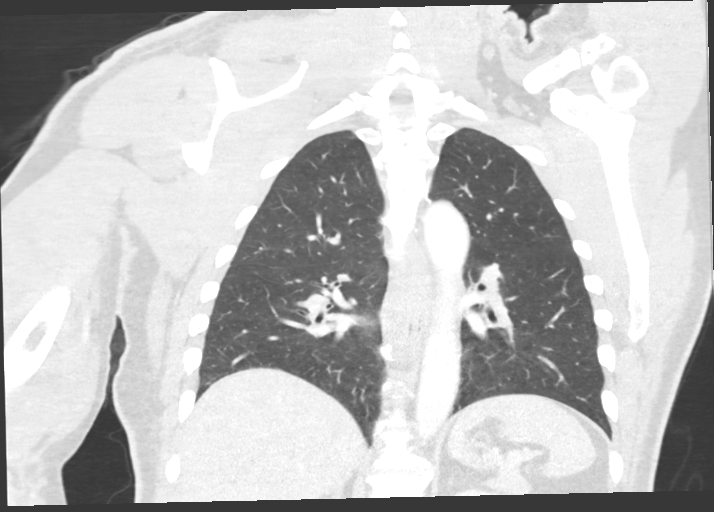

[15 of 36 positions shown; findings below may reference images not displayed]

FINDINGS: Cardiovascular: Mild coronary artery calcification. Marked left
ventricular hypertrophy. Global cardiac size within normal limits.
Trace pericardial fluid is likely physiologic. The central pulmonary
arteries are of normal caliber. Minimal atherosclerotic
calcification within the thoracic aorta. No aortic aneurysm.

Mediastinum/Nodes: The thyroid gland is not well visualized
suggesting marked atrophy or thyroidectomy. No pathologic thoracic
adenopathy. The esophagus is unremarkable.

Lungs/Pleura: Lungs are clear. No pleural effusion or pneumothorax.

Upper Abdomen: No acute abnormality.

Musculoskeletal: A 1.6 x 1.5 cm rim enhancing fluid collection is
seen within the subcutaneous fat of the right shoulder superficial
to the right acromion demonstrating a small sinus tract to the skin,
best seen on image # 61/5. There is associated dermal thickening and
surrounding subcutaneous edema in keeping with an inflammatory
process. Infiltrative change related to edema extends inferiorly
within the subcutaneous fat superficial to the pectoralis anteriorly
and trapezius and deltoid musculature posteriorly. The subjacent
acromion is unremarkable. No acute bone abnormality.
IMPRESSION: Subcutaneous abscess within the right shoulder superficial to the
acromion with extensive surrounding inflammatory change extending
inferiorly superficial to the pectoralis, trapezius and deltoid
musculature.

Marked left ventricular hypertrophy. Mild coronary artery
calcification.

Probable marked atrophy of the thyroid gland. Correlation with
thyroid function tests may be helpful.

Aortic Atherosclerosis (XIBHO-1P1.1).

## 2021-07-12 ENCOUNTER — Telehealth: Payer: Self-pay | Admitting: Pharmacist

## 2021-07-12 ENCOUNTER — Ambulatory Visit: Payer: 59 | Admitting: Pharmacist

## 2021-07-12 DIAGNOSIS — I10 Essential (primary) hypertension: Secondary | ICD-10-CM

## 2021-07-12 NOTE — Patient Instructions (Signed)
Check your blood pressure, and any time you have concerning symptoms like headache, chest pain, dizziness, shortness of breath, or vision changes.  ? ?Our goal is less than 130/80. ? ?To appropriately check your blood pressure, make sure you do the following:  ?1) Avoid caffeine, exercise, or tobacco products for 30 minutes before checking. Empty your bladder. ?2) Sit with your back supported in a flat-backed chair. Rest your arm on something flat (arm of the chair, table, etc). ?3) Sit still with your feet flat on the floor, resting, for at least 5 minutes.  ?4) Check your blood pressure. Take 1-2 readings.  ?5) Write down these readings and bring with you to any provider appointments. ? ?Bring your home blood pressure machine with you to a provider's office for accuracy comparison at least once a year.  ? ?Make sure you take your blood pressure medications before you come to any office visit, even if you were asked to fast for labs.  ? ?Wallace Cullens, PharmD, BCACP, CPP ?Clinical Pharmacist ?[facility] ?[place] ?[phone] ? ?

## 2021-07-12 NOTE — Telephone Encounter (Signed)
?  Chronic Care Management  ? ?Outreach Note ? ?07/12/2021 ?Name: Todd Burns MRN: [id number] DOB: June 03, 1960 ? ? ?Patient appearing on report for [place] Metric Hypertension Control due to last documented ambulatory blood pressure of 154/88 on 04/29/2021. Next appointment with PCP is 10/28/2021.  ? ?Outreached patient to discuss hypertension control and medication management. Was unable to reach patient via telephone today and have left HIPAA compliant voicemail asking patient to return my call.  ? ? ?Follow Up Plan: CM Pharmacist will outreach to patient by telephone again within the next 30 days ? ?Wallace Cullens, PharmD, BCACP ?Clinical Pharmacist ?[facility] ?[place] ?[phone] ? ?

## 2021-07-12 NOTE — Chronic Care Management (AMB) (Signed)
?Chronic Care Management  ? ?Outreach Note ? ?07/12/2021 ?Name: Todd Burns MRN: [id number] DOB: 1960-10-22 ? ?I connected with Todd Burns on 07/12/21 by telephone outreach and verified that I am speaking with the correct person using two identifiers. ? ?Patient appearing on report for [place] Metric Hypertension Control due to last documented ambulatory blood pressure of 154/88 on 04/29/2021. Next appointment with PCP is 10/28/2021.  ? ?Outreached patient to discuss hypertension control and medication management.  ? ?Outpatient Encounter Medications as of 07/12/2021  ?Medication Sig  ? amLODipine (NORVASC) 10 MG tablet TAKE 1 TABLET BY MOUTH EVERY DAY  ? fluticasone (FLONASE) 50 MCG/ACT nasal spray Place 2 sprays into both nostrils daily. Use for 4-6 weeks then stop and use seasonally or as needed.  ? hydrALAZINE (APRESOLINE) 25 MG tablet Take 1 tablet (25 mg total) by mouth in the morning and at bedtime.  ? levothyroxine (SYNTHROID) 175 MCG tablet Take 1 tablet (175 mcg total) by mouth daily before breakfast.  ? losartan (COZAAR) 100 MG tablet TAKE 1 TABLET BY MOUTH EVERY DAY  ? montelukast (SINGULAIR) 10 MG tablet Take 1 tablet (10 mg total) by mouth at bedtime.  ? naproxen (NAPROSYN) 500 MG tablet Take 500 mg by mouth 2 (two) times daily with a meal. As needed  ? omeprazole (PRILOSEC) 40 MG capsule Take 1 capsule (40 mg total) by mouth daily before breakfast. 4-6 weeks then can taper down off med  ? rosuvastatin (CRESTOR) 10 MG tablet Take 1 tablet (10 mg total) by mouth at bedtime.  ? sildenafil (REVATIO) 20 MG tablet Take 1-5 pills about 60 min prior to sex. Start with 1 and increase as needed.  ? spironolactone (ALDACTONE) 25 MG tablet Take 1 tablet (25 mg total) by mouth daily.  ? triamcinolone cream (KENALOG) 0.1 % Apply 1 application topically 2 (two) times daily. As needed for bug bites or itching  ? ?No facility-administered encounter medications on file as of 07/12/2021.  ? ? ?Lab Results  ?Component Value  Date  ? CREATININE 1.22 04/29/2021  ? BUN 14 04/29/2021  ? NA 137 04/29/2021  ? K 4.1 04/29/2021  ? CL 106 04/29/2021  ? CO2 24 04/29/2021  ? ? ?BP Readings from Last 3 Encounters:  ?04/29/21 (!) 154/88  ?06/11/20 140/82  ?04/30/20 (!) 158/90  ? ? ?Pulse Readings from Last 3 Encounters:  ?04/29/21 60  ?06/11/20 (!) 58  ?04/30/20 (!) 51  ? ? ?Current medications:  ?amlodipine 10 mg daily ?losartan 100 mg daily ?spironolactone 25 mg daily ?Identify patient not taking hydralazine 25 mg twice daily ?Reports did not pick up refill from pharmacy in time and prescription was returned to stock ?Patient to contact pharmacy today to request hydralazine Rx be refilled and patient to restart taking as directed ? ?Encourage patient to start using weekly pillbox as adherence tool ? ?Home Monitoring: ?Patient has an automated upper arm home BP machine, but denies monitoring recently ?Counsel on BP monitoring technique ? ?Counsel on impact of salt/sodium on blood pressure ?Patient denies adding salt to his food ?Encourage patient to review nutrition labels for sodium content ? ?Current physical activity: active at work 5 days/week ? ?From review of chart, note on 04/29/2021 PCP faxed referral to [place] to arrange initial PSG home vs in sleep lab and further evaluation for suspected obstructive sleep apnea.  ?Will follow up with patient regarding this referral during next telephone call ? ? ?Assessment/Plan: ?- Currently uncontrolled ?- Reviewed  appropriate administration of medication regimen ?- Counseled on long term microvascular and macrovascular complications of uncontrolled hypertension ?- Reviewed appropriate home BP monitoring technique (avoid caffeine, smoking, and exercise for 30 minutes before checking, rest for at least 5 minutes before taking BP, sit with feet flat on the floor and back against a hard surface, uncross legs, and rest arm on flat surface) ?- Reviewed to check blood pressure, document,  and provide at next provider visit ?- Discussed dietary modifications, such as reduced salt intake, focus on whole grains, vegetables, lean proteins ?- Reviewed strategies to improve medication adherence such as using weekly pillbox ? ? ?Follow Up Plan: CM Pharmacist will outreach to patient by telephone again on 07/26/2021 at 4:15 PM ? ?Wallace Cullens, PharmD, BCACP ?Clinical Pharmacist ?[facility] ?[place] ?[phone] ? ?

## 2021-07-26 ENCOUNTER — Telehealth: Payer: 59

## 2021-07-26 ENCOUNTER — Telehealth: Payer: Self-pay | Admitting: Pharmacist

## 2021-07-26 NOTE — Telephone Encounter (Signed)
  Chronic Care Management    Outreach Note   07/12/2021 Name: Todd Burns MRN: [id number]       DOB: Sep 16, 1960     Patient appearing on report for [place] Metric Hypertension Control due to last documented ambulatory blood pressure of 154/88 on 04/29/2021. Next appointment with PCP is 10/28/2021.    Outreached patient to discuss hypertension control and medication management. Was unable to reach patient via telephone today and have left HIPAA compliant voicemail asking patient to return my call.      Follow Up Plan: CM Pharmacist will outreach to patient by telephone again within the next 30 days   Wallace Cullens, PharmD, [facility] [phone]

## 2021-08-17 ENCOUNTER — Other Ambulatory Visit: Payer: Self-pay | Admitting: Family Medicine

## 2021-08-17 DIAGNOSIS — E039 Hypothyroidism, unspecified: Secondary | ICD-10-CM

## 2021-08-18 NOTE — Telephone Encounter (Signed)
Refused Synthroid 175 mcg because requested too early

## 2021-09-01 ENCOUNTER — Telehealth: Payer: Self-pay | Admitting: Pharmacist

## 2021-09-01 NOTE — Telephone Encounter (Signed)
  Chronic Care Management    Outreach Note   09/01/21  Name: ERROL ALA MRN: [id number]       DOB: 1961-02-02     Patient appearing on report for [facility] Hypertension Control due to last documented ambulatory blood pressure of 154/88 on 04/29/2021. Next appointment with PCP is 10/28/2021.    Outreached patient to discuss hypertension control and medication management. Was unable to reach patient via telephone today and have left HIPAA compliant voicemail asking patient to return my call. Outreach attempt #2     Wallace Cullens, PharmD, [place] [phone]

## 2021-10-18 ENCOUNTER — Telehealth: Payer: Self-pay | Admitting: Pharmacist

## 2021-10-18 NOTE — Telephone Encounter (Signed)
  Chronic Care Management    Outreach Note   10/18/21  Name: KEYLON LABELLE MRN: [id number]       DOB: November 10, 1960     Patient appearing on report for [employer] [facility] Hypertension Control due to last documented ambulatory blood pressure of 154/88 on 04/29/2021. Next appointment with PCP is 10/28/2021.    Outreached patient to discuss hypertension control and medication management. Was unable to reach patient via telephone today and have left HIPAA compliant voicemail asking patient to return my call.      Wallace Cullens, PharmD, [place] [phone]

## 2021-10-28 ENCOUNTER — Ambulatory Visit: Payer: 59 | Admitting: Family Medicine

## 2021-11-03 ENCOUNTER — Encounter: Payer: Self-pay | Admitting: Family Medicine

## 2021-11-03 ENCOUNTER — Other Ambulatory Visit: Payer: Self-pay | Admitting: Family Medicine

## 2021-11-03 ENCOUNTER — Ambulatory Visit: Payer: 59 | Admitting: Family Medicine

## 2021-11-03 VITALS — BP 150/80 | HR 60 | Ht 67.0 in | Wt 212.8 lb

## 2021-11-03 DIAGNOSIS — J3089 Other allergic rhinitis: Secondary | ICD-10-CM

## 2021-11-03 DIAGNOSIS — R3911 Hesitancy of micturition: Secondary | ICD-10-CM

## 2021-11-03 DIAGNOSIS — I10 Essential (primary) hypertension: Secondary | ICD-10-CM | POA: Diagnosis not present

## 2021-11-03 DIAGNOSIS — N401 Enlarged prostate with lower urinary tract symptoms: Secondary | ICD-10-CM | POA: Diagnosis not present

## 2021-11-03 DIAGNOSIS — E039 Hypothyroidism, unspecified: Secondary | ICD-10-CM

## 2021-11-03 DIAGNOSIS — E782 Mixed hyperlipidemia: Secondary | ICD-10-CM

## 2021-11-03 DIAGNOSIS — Z23 Encounter for immunization: Secondary | ICD-10-CM

## 2021-11-03 DIAGNOSIS — R7303 Prediabetes: Secondary | ICD-10-CM

## 2021-11-03 MED ORDER — LEVOTHYROXINE SODIUM 175 MCG PO TABS: 175.0000 ug | ORAL_TABLET | Freq: Every day | ORAL | 3 refills | Status: DC

## 2021-11-03 MED ORDER — HYDRALAZINE HCL 25 MG PO TABS: 25.0000 mg | ORAL_TABLET | Freq: Two times a day (BID) | ORAL | 3 refills | Status: DC

## 2021-11-03 MED ORDER — MONTELUKAST SODIUM 10 MG PO TABS: 10.0000 mg | ORAL_TABLET | Freq: Every day | ORAL | 3 refills | Status: DC

## 2021-11-03 MED ORDER — LOSARTAN POTASSIUM 100 MG PO TABS: 100.0000 mg | ORAL_TABLET | Freq: Every day | ORAL | 3 refills | Status: DC

## 2021-11-03 MED ORDER — SPIRONOLACTONE 25 MG PO TABS: 25.0000 mg | ORAL_TABLET | Freq: Every day | ORAL | 3 refills | Status: DC

## 2021-11-03 NOTE — Progress Notes (Unsigned)
Subjective:    Patient ID: Todd Burns, male    DOB: 12-27-1960, 61 y.o.   MRN: [id number]  Todd Burns is a 61 y.o. male presenting on 11/03/2021 for Hypertension and Sleep Apnea   HPI  CHRONIC HTN: Suspected OSA   - Unable to do the sleep study lately, he has had issues with family and has not had time to arrange the Sleep Study   Cardiology - deferred BB due to [place]. They considered more potent ARB. Continued Spironolactone 59m daily, Amlodipine 130mdaily, Losartan 10022maily.   He was started on Hydralazine 13m30mD   Currently off Spironolactone and Hydralazine   Chronic issue poorly managed BP. He checks his BP regularly still elevated Previously was ordered PSG but he could not afford and it was declined and order has now expired   Reports good compliance, took meds today. Tolerating well, w/o complaints.  Admits some life stressors still present Denies CP, dyspnea, HA, edema, dizziness / lightheadedness   Pre-Diabetes: Reports in past high sugar 5.9, now has improved Last lab A1c 5.7 Meds: none Currently on ARB Denies hypoglycemia, polyuria, visual changes, numbness or tingling.   Low Risk Prostate Cancer BPH Erectile dysfunction  Followed by Urology, prior biopsy with low risk prostate cancer Monitoring PSA trend No fam history prostate CA   Hypothyroidism Chronic problem, previous TSH normal. Continues Levothyroxine 175mc76mily, taking medicine with other meds. Prior elevated TSH  OSA Has not completed sleep study  Left Hand 4th / 5th Finger Numbness Reports episodic flares with painful numbness  and feels swollen. At work has repetitive activity leaning on his med.      11/03/2021    9:24 AM 04/29/2021    9:22 AM 11/05/2019    8:16 AM  Depression screen PHQ 2/9  Decreased Interest 0 0 0  Down, Depressed, Hopeless 0 0 0  PHQ - 2 Score 0 0 0  Altered sleeping 1 1   Tired, decreased energy 0 0   Change in appetite 0 0   Feeling bad  or failure about yourself  0 0   Trouble concentrating 0 0   Moving slowly or fidgety/restless 0 0   Suicidal thoughts 0 0   PHQ-9 Score 1 1   Difficult doing work/chores Not difficult at all Not difficult at all     Social History   Tobacco Use   Smoking status: Every Day    Packs/day: 1.00    Years: 15.00    Total pack years: 15.00    Types: Cigarettes   Smokeless tobacco: Never  Vaping Use   Vaping Use: Former  Substance Use Topics   Alcohol use: Not Currently    Comment: Drinks every month or two   Drug use: No    Review of Systems Per HPI unless specifically indicated above     Objective:    BP (!) 150/80 (BP Location: Left Arm, Cuff Size: Normal)   Pulse 60   Ht '5\' 7"'  (1.702 m)   Wt 212 lb 12.8 oz (96.5 kg)   SpO2 98%   BMI 33.33 kg/m   Wt Readings from Last 3 Encounters:  11/03/21 212 lb 12.8 oz (96.5 kg)  04/29/21 219 lb 12.8 oz (99.7 kg)  06/11/20 214 lb 8 oz (97.3 kg)    Physical Exam Vitals and nursing note reviewed.  Constitutional:      General: He is not in acute distress.    Appearance: He is well-developed. He  is not diaphoretic.     Comments: Well-appearing, comfortable, cooperative  HENT:     Head: Normocephalic and atraumatic.  Eyes:     General:        Right eye: No discharge.        Left eye: No discharge.     Conjunctiva/sclera: Conjunctivae normal.  Neck:     Thyroid: No thyromegaly.  Cardiovascular:     Rate and Rhythm: Normal rate and regular rhythm.     Pulses: Normal pulses.     Heart sounds: Normal heart sounds. No murmur heard. Pulmonary:     Effort: Pulmonary effort is normal. No respiratory distress.     Breath sounds: Normal breath sounds. No wheezing or rales.  Musculoskeletal:        General: Normal range of motion.     Cervical back: Normal range of motion and neck supple.  Lymphadenopathy:     Cervical: No cervical adenopathy.  Skin:    General: Skin is warm and dry.     Findings: No erythema or rash.   Neurological:     Mental Status: He is alert and oriented to person, place, and time. Mental status is at baseline.  Psychiatric:        Behavior: Behavior normal.     Comments: Well groomed, good eye contact, normal speech and thoughts    Results for orders placed or performed in visit on 11/03/21  TSH  Result Value Ref Range   TSH 9.63 (H) 0.40 - 4.50 mIU/L  T4, free  Result Value Ref Range   Free T4 1.5 0.8 - 1.8 ng/dL  BASIC METABOLIC PANEL WITH GFR  Result Value Ref Range   Glucose, Bld 88 65 - 99 mg/dL   BUN 10 7 - 25 mg/dL   Creat 1.10 0.70 - 1.35 mg/dL   eGFR 76 > OR = 60 mL/min/1.65m   BUN/Creatinine Ratio SEE NOTE: 6 - 22 (calc)   Sodium 138 135 - 146 mmol/L   Potassium 4.1 3.5 - 5.3 mmol/L   Chloride 106 98 - 110 mmol/L   CO2 23 20 - 32 mmol/L   Calcium 8.8 8.6 - 10.3 mg/dL      Assessment & Plan:   Problem List Items Addressed This Visit     BPH (benign prostatic hyperplasia)   Essential hypertension    Again elevated BP Repeat measurement No known complications Suspect untreated OSA underlying and or smoking secondary causes of HTN worsening  Has not pursued sleep study yet due to financial  Plan:  1. Continue current - Losartan 109mdaily, Amlodipine 1015maily, Spironolactone 58m55mily, Hydralazine 25 BID  2. Encourage improved lifestyle - low sodium diet, regular exercise 3. Keep weekly monitor BP outside office, bring readings to next visit, if persistently >140/90 or new symptoms notify office sooner      Relevant Medications   hydrALAZINE (APRESOLINE) 25 MG tablet   losartan (COZAAR) 100 MG tablet   spironolactone (ALDACTONE) 25 MG tablet   Other Relevant Orders   BASIC METABOLIC PANEL WITH GFR (Completed)   Hypothyroidism - Primary   Relevant Medications   levothyroxine (SYNTHROID) 175 MCG tablet   Other Relevant Orders   TSH (Completed)   T4, free (Completed)   Pre-diabetes   Other Visit Diagnoses     Environmental and seasonal  allergies       Relevant Medications   montelukast (SINGULAIR) 10 MG tablet   Needs flu shot       Relevant  Orders   Flu Vaccine QUAD 13moIM (Fluarix, Fluzone & Alfiuria Quad PF) (Completed)        Try to avoid any direct pressure or repetitive activity on Left elbow nerve.  Switch the Levothyroxine thyroid medicine to first thing in AM when wake up, then pause at least 20-30 min, take the other meds after you get ready. Results show normal T4. And still elevated but notable improved TSH to 9 from 18 Adjusting dosing should improve. Repeat lab in 6 months  Refilled all medications  Future if you can pursue the Sleep Study, let me know.  Orders Placed This Encounter  Procedures   Flu Vaccine QUAD 642moM (Fluarix, Fluzone & Alfiuria Quad PF)   TSH   T4, free   BASIC METABOLIC PANEL WITH GFR     Meds ordered this encounter  Medications   hydrALAZINE (APRESOLINE) 25 MG tablet    Sig: Take 1 tablet (25 mg total) by mouth in the morning and at bedtime.    Dispense:  180 tablet    Refill:  3   levothyroxine (SYNTHROID) 175 MCG tablet    Sig: Take 1 tablet (175 mcg total) by mouth daily before breakfast.    Dispense:  90 tablet    Refill:  3   losartan (COZAAR) 100 MG tablet    Sig: Take 1 tablet (100 mg total) by mouth daily.    Dispense:  90 tablet    Refill:  3   montelukast (SINGULAIR) 10 MG tablet    Sig: Take 1 tablet (10 mg total) by mouth at bedtime.    Dispense:  90 tablet    Refill:  3   spironolactone (ALDACTONE) 25 MG tablet    Sig: Take 1 tablet (25 mg total) by mouth daily.    Dispense:  90 tablet    Refill:  3      Follow up plan: Return in about 6 months (around 05/05/2022) for 6 month fasting lab only then 1 week later Annual Physical.  Future labs ordered for 04/2022  AlNobie PutnamDOCobreroup 11/03/2021, 9:46 AM

## 2021-11-03 NOTE — Patient Instructions (Addendum)
Thank you for coming to the office today.  Try to avoid any direct pressure or repetitive activity on Left elbow nerve.  Switch the Levothyroxine thyroid medicine to first thing in AM when wake up, then pause at least 20-30 min, take the other meds after you get ready.  Labs today to recheck thyroid and we can follow up again in February 2024  Refilled all medications  Future if you can pursue the Sleep Study, let me know.   Please schedule a Follow-up Appointment to: Return in about 6 months (around 05/05/2022) for 6 month fasting lab only then 1 week later Annual Physical.  If you have any other questions or concerns, please feel free to call the office or send a message through [place]. You may also schedule an earlier appointment if necessary.  Additionally, you may be receiving a survey about your experience at our office within a few days to 1 week by e-mail or mail. We value your feedback.  [name], DO [place]

## 2021-11-04 ENCOUNTER — Other Ambulatory Visit: Payer: Self-pay | Admitting: Family Medicine

## 2021-11-04 DIAGNOSIS — R7303 Prediabetes: Secondary | ICD-10-CM

## 2021-11-04 DIAGNOSIS — C61 Malignant neoplasm of prostate: Secondary | ICD-10-CM

## 2021-11-04 DIAGNOSIS — Z0000 Encounter for general adult medical examination without abnormal findings: Secondary | ICD-10-CM

## 2021-11-04 DIAGNOSIS — E039 Hypothyroidism, unspecified: Secondary | ICD-10-CM

## 2021-11-04 DIAGNOSIS — E782 Mixed hyperlipidemia: Secondary | ICD-10-CM

## 2021-11-04 DIAGNOSIS — I10 Essential (primary) hypertension: Secondary | ICD-10-CM

## 2021-11-04 DIAGNOSIS — N401 Enlarged prostate with lower urinary tract symptoms: Secondary | ICD-10-CM

## 2021-11-04 LAB — BASIC METABOLIC PANEL WITH GFR
BUN: 10 mg/dL (ref 7–25)
CO2: 23 mmol/L (ref 20–32)
Calcium: 8.8 mg/dL (ref 8.6–10.3)
Chloride: 106 mmol/L (ref 98–110)
Creat: 1.1 mg/dL (ref 0.70–1.35)
Glucose, Bld: 88 mg/dL (ref 65–99)
Potassium: 4.1 mmol/L (ref 3.5–5.3)
Sodium: 138 mmol/L (ref 135–146)
eGFR: 76 mL/min/{1.73_m2} (ref >=60)

## 2021-11-04 LAB — T4, FREE: Free T4: 1.5 ng/dL (ref 0.8–1.8)

## 2021-11-04 LAB — TSH: TSH: 9.63 mIU/L — ABNORMAL HIGH (ref 0.40–4.50)

## 2021-11-04 NOTE — Assessment & Plan Note (Signed)
Again elevated BP Repeat measurement No known complications Suspect untreated OSA underlying and or smoking secondary causes of HTN worsening  Has not pursued sleep study yet due to financial  Plan:  1. Continue current - Losartan '100mg'$  daily, Amlodipine '10mg'$  daily, Spironolactone '25mg'$  daily, Hydralazine 25 BID  2. Encourage improved lifestyle - low sodium diet, regular exercise 3. Keep weekly monitor BP outside office, bring readings to next visit, if persistently >140/90 or new symptoms notify office sooner

## 2022-04-11 ENCOUNTER — Telehealth: Payer: Self-pay | Admitting: Pharmacist

## 2022-04-11 NOTE — Telephone Encounter (Signed)
  Chronic Care Management    Outreach Note   04/11/22  Name: Todd Burns MRN: [id number]       DOB: January 11, 1961     Patient appearing on report for [employer] [facility] Hypertension Control due to last documented ambulatory blood pressure of 150/80 on 11/03/2021. Next appointment with PCP is 05/10/2022.    Outreached patient to discuss hypertension control and medication management. Was unable to reach patient via telephone today and have left HIPAA compliant voicemail asking patient to return my call.      Wallace Cullens, PharmD, [place] [phone]

## 2022-05-02 ENCOUNTER — Other Ambulatory Visit: Payer: Self-pay

## 2022-05-02 DIAGNOSIS — N401 Enlarged prostate with lower urinary tract symptoms: Secondary | ICD-10-CM

## 2022-05-02 DIAGNOSIS — C61 Malignant neoplasm of prostate: Secondary | ICD-10-CM

## 2022-05-02 DIAGNOSIS — E039 Hypothyroidism, unspecified: Secondary | ICD-10-CM

## 2022-05-02 DIAGNOSIS — R7303 Prediabetes: Secondary | ICD-10-CM

## 2022-05-02 DIAGNOSIS — I10 Essential (primary) hypertension: Secondary | ICD-10-CM

## 2022-05-02 DIAGNOSIS — E782 Mixed hyperlipidemia: Secondary | ICD-10-CM

## 2022-05-02 DIAGNOSIS — Z0000 Encounter for general adult medical examination without abnormal findings: Secondary | ICD-10-CM

## 2022-05-03 ENCOUNTER — Other Ambulatory Visit: Payer: 59

## 2022-05-04 LAB — LIPID PANEL
Cholesterol: 235 mg/dL — ABNORMAL HIGH (ref <200)
HDL: 45 mg/dL (ref >=40)
LDL Cholesterol (Calc): 170 mg/dL (calc) — ABNORMAL HIGH
Non-HDL Cholesterol (Calc): 190 mg/dL (calc) — ABNORMAL HIGH (ref <130)
Total CHOL/HDL Ratio: 5.2 (calc) — ABNORMAL HIGH (ref <5.0)
Triglycerides: 87 mg/dL (ref <150)

## 2022-05-04 LAB — CBC WITH DIFFERENTIAL/PLATELET
Absolute Monocytes: 639 cells/uL (ref 200–950)
Basophils Absolute: 81 cells/uL (ref 0–200)
Basophils Relative: 1.3 %
Eosinophils Absolute: 112 cells/uL (ref 15–500)
Eosinophils Relative: 1.8 %
HCT: 40.2 % (ref 38.5–50.0)
Hemoglobin: 13.7 g/dL (ref 13.2–17.1)
Lymphs Abs: 2257 cells/uL (ref 850–3900)
MCH: 28.7 pg (ref 27.0–33.0)
MCHC: 34.1 g/dL (ref 32.0–36.0)
MCV: 84.3 fL (ref 80.0–100.0)
MPV: 11.6 fL (ref 7.5–12.5)
Monocytes Relative: 10.3 %
Neutro Abs: 3112 cells/uL (ref 1500–7800)
Neutrophils Relative %: 50.2 %
Platelets: 183 10*3/uL (ref 140–400)
RBC: 4.77 10*6/uL (ref 4.20–5.80)
RDW: 15.7 % — ABNORMAL HIGH (ref 11.0–15.0)
Total Lymphocyte: 36.4 %
WBC: 6.2 10*3/uL (ref 3.8–10.8)

## 2022-05-04 LAB — COMPLETE METABOLIC PANEL WITH GFR
AG Ratio: 1.4 (calc) (ref 1.0–2.5)
ALT: 8 U/L — ABNORMAL LOW (ref 9–46)
AST: 20 U/L (ref 10–35)
Albumin: 4.1 g/dL (ref 3.6–5.1)
Alkaline phosphatase (APISO): 50 U/L (ref 35–144)
BUN: 12 mg/dL (ref 7–25)
CO2: 25 mmol/L (ref 20–32)
Calcium: 8.9 mg/dL (ref 8.6–10.3)
Chloride: 108 mmol/L (ref 98–110)
Creat: 1.15 mg/dL (ref 0.70–1.35)
Globulin: 3 g/dL (calc) (ref 1.9–3.7)
Glucose, Bld: 95 mg/dL (ref 65–99)
Potassium: 4 mmol/L (ref 3.5–5.3)
Sodium: 140 mmol/L (ref 135–146)
Total Bilirubin: 0.6 mg/dL (ref 0.2–1.2)
Total Protein: 7.1 g/dL (ref 6.1–8.1)
eGFR: 72 mL/min/{1.73_m2} (ref >=60)

## 2022-05-04 LAB — HEMOGLOBIN A1C
Hgb A1c MFr Bld: 6 % of total Hgb — ABNORMAL HIGH (ref <5.7)
Mean Plasma Glucose: 126 mg/dL
eAG (mmol/L): 7 mmol/L

## 2022-05-04 LAB — TSH: TSH: 14.39 mIU/L — ABNORMAL HIGH (ref 0.40–4.50)

## 2022-05-04 LAB — PSA: PSA: 0.41 ng/mL (ref <=4.00)

## 2022-05-04 LAB — T4, FREE: Free T4: 1.1 ng/dL (ref 0.8–1.8)

## 2022-05-10 ENCOUNTER — Other Ambulatory Visit: Payer: Self-pay | Admitting: Family Medicine

## 2022-05-10 ENCOUNTER — Encounter: Payer: Self-pay | Admitting: Family Medicine

## 2022-05-10 ENCOUNTER — Ambulatory Visit (INDEPENDENT_AMBULATORY_CARE_PROVIDER_SITE_OTHER): Payer: 59 | Admitting: Family Medicine

## 2022-05-10 VITALS — BP 138/72 | HR 61 | Ht 67.0 in | Wt 213.9 lb

## 2022-05-10 DIAGNOSIS — I10 Essential (primary) hypertension: Secondary | ICD-10-CM

## 2022-05-10 DIAGNOSIS — R7303 Prediabetes: Secondary | ICD-10-CM

## 2022-05-10 DIAGNOSIS — C61 Malignant neoplasm of prostate: Secondary | ICD-10-CM

## 2022-05-10 DIAGNOSIS — M25561 Pain in right knee: Secondary | ICD-10-CM

## 2022-05-10 DIAGNOSIS — G8929 Other chronic pain: Secondary | ICD-10-CM

## 2022-05-10 DIAGNOSIS — Z87828 Personal history of other (healed) physical injury and trauma: Secondary | ICD-10-CM

## 2022-05-10 DIAGNOSIS — R3911 Hesitancy of micturition: Secondary | ICD-10-CM

## 2022-05-10 DIAGNOSIS — Z0000 Encounter for general adult medical examination without abnormal findings: Secondary | ICD-10-CM

## 2022-05-10 DIAGNOSIS — N401 Enlarged prostate with lower urinary tract symptoms: Secondary | ICD-10-CM

## 2022-05-10 DIAGNOSIS — E669 Obesity, unspecified: Secondary | ICD-10-CM

## 2022-05-10 DIAGNOSIS — M1711 Unilateral primary osteoarthritis, right knee: Secondary | ICD-10-CM

## 2022-05-10 DIAGNOSIS — E782 Mixed hyperlipidemia: Secondary | ICD-10-CM

## 2022-05-10 DIAGNOSIS — E039 Hypothyroidism, unspecified: Secondary | ICD-10-CM

## 2022-05-10 DIAGNOSIS — K219 Gastro-esophageal reflux disease without esophagitis: Secondary | ICD-10-CM

## 2022-05-10 DIAGNOSIS — M65331 Trigger finger, right middle finger: Secondary | ICD-10-CM

## 2022-05-10 NOTE — Patient Instructions (Addendum)
Thank you for coming to the office today.  Medications should be available at the pharmacy. Plenty refills through August  Let me know if you cannot get any of the medications  Cholesterol elevated if off medication, goal to restart therapy.  Lipid Panel     Component Value Date/Time   CHOL 235 (H) 05/03/2022 0800   TRIG 87 05/03/2022 0800   HDL 45 05/03/2022 0800   CHOLHDL 5.2 (H) 05/03/2022 0800   LDLCALC 170 (H) 05/03/2022 0800    Recent Labs    05/03/22 0800  HGBA1C 6.0*   A1c level has slightly increased, this is still mild or early Pre Diabetes.  Goal to limit carb starches sugars.  -------------------  Keep on current Thyroid medication dose  Next Colonoscopy due in 2026  Prostate PSA Lab result is normal or negative 0.4  ------------------  Referral to Orthopedic for the Right Knee  [facility] (formerly [facility]) Address: [address], [place], [place] [address] Hours:  9AM-5PM Phone: [phone]  Future for the Right hand Trigger Finger / Arthritis, we can also refer to the Hand specialist at [facility] - but wait until you are ready after the Knee   For smoking cessation, free program 1 800-QUIT NOW  Or we can order medications in the future   Please schedule a Follow-up Appointment to: Return in about 6 months (around 11/10/2022) for 6 month fasting lab then 1 week later Follow up Thyroid PreDM, HTN, Updates R Knee Ortho.  If you have any other questions or concerns, please feel free to call the office or send a message through [place]. You may also schedule an earlier appointment if necessary.  Additionally, you may be receiving a survey about your experience at our office within a few days to 1 week by e-mail or mail. We value your feedback.  [name], DO [place]

## 2022-05-10 NOTE — Assessment & Plan Note (Signed)
See A&P

## 2022-05-10 NOTE — Assessment & Plan Note (Signed)
Previously managed by Urology S/p HOLEP Single core biopsy very low grade prostate cancer Surveillance on PSA Last lab 04/2022, negative low

## 2022-05-10 NOTE — Assessment & Plan Note (Signed)
A1c up to 6.0 mild increase Concern with obesity, HTN, HLD  Plan: 1. Not on any therapy currently 2. Encourage improved lifestyle - low carb, low sugar diet, switch to diet, reduce portion size, continue improving regular exercise

## 2022-05-10 NOTE — Progress Notes (Signed)
Subjective:    Patient ID: Todd Burns, male    DOB: Aug 17, 1960, 62 y.o.   MRN: [id number]  Todd Burns is a 62 y.o. male presenting on 05/10/2022 for Annual Exam   HPI  Here for Annual Physical  CHRONIC HTN: Suspected OSA - unable to do PSG Cardiology - deferred BB due to bradyacrdia. They considered more potent ARB. Medications: = Spironolactone '25mg'$  daily, Amlodipine '10mg'$  daily, Losartan '100mg'$  daily, Hydralazine 25 BID Reports good compliance, took meds today. Tolerating well, w/o complaints.  Admits some life stressors still present Denies CP, dyspnea, HA, edema, dizziness / lightheadedness   HYPERLIPIDEMIA: - Reports no concerns. Last lipid panel 04/2022 LDL 170 uncontrolled Not on med regularly - Currently taking Rosuvastatin '10mg'$  but not adhering regularly at all needs new order  Pre-Diabetes: A1c up to 6.0 Prior 5.7 to 5.8 Meds: none Currently on ARB Denies hypoglycemia, polyuria, visual changes, numbness or tingling.   Low Risk Prostate Cancer BPH Erectile dysfunction  Followed by Urology, prior biopsy with low risk prostate cancer Monitoring PSA trend Last result 0.41 (previously 0.83, past history PSA 3-4 range 2-4 yr ago) No fam history prostate CA   Hypothyroidism Chronic problem, TSH was elevated again mildly, but Free T4 1.1 which is in the normal range. He does not have any new symptoms regarding this. Continues Levothyroxine 178mg daily, taking medicine with other meds. Prior elevated TSH   OSA Has not completed sleep study Not ready to pursue   Right hand Osteoarthritis / R Middle Finger Trigger Episodic flares with pain difficulty with stiffness pain in hand fingers R middle finger sticks and locks with triggering episodes. Repetitive motions at work  Follow-up Right Knee Pain / Torn Meniscus Followed by [facility], history of torn R meniscus, has deferred surgery Still has episodic pain R knee - He has prior history of Right knee  surgical repair of MCL and possibly meniscus injury performed years ago, 1999 on chart review Able to ambulate without assistance ready for referral to [facility]  Tobacco Abuse Smokes during break time If he is at home and relaxing will smoke more   Health Maintenance: See PSA        05/10/2022    8:46 AM 11/03/2021    9:24 AM 04/29/2021    9:22 AM  Depression screen PHQ 2/9  Decreased Interest 0 0 0  Down, Depressed, Hopeless 0 0 0  PHQ - 2 Score 0 0 0  Altered sleeping 0 1 1  Tired, decreased energy 0 0 0  Change in appetite 0 0 0  Feeling bad or failure about yourself  0 0 0  Trouble concentrating 0 0 0  Moving slowly or fidgety/restless 0 0 0  Suicidal thoughts 0 0 0  PHQ-9 Score 0 1 1  Difficult doing work/chores Not difficult at all Not difficult at all Not difficult at all    Past Medical History:  Diagnosis Date   BPH (benign prostatic hyperplasia)    GERD (gastroesophageal reflux disease)    Hypertension    Hypothyroidism    LVH (left ventricular hypertrophy)    suspicous for hypertrophic cardiomyopathy   Pre-diabetes    Tear of MCL (medial collateral ligament) of knee    Past Surgical History:  Procedure Laterality Date   COLONOSCOPY N/A 08/08/2014   Procedure: COLONOSCOPY;  Surgeon: DLucilla Lame MD;  Location: [facility]  Service: Gastroenterology;  Laterality: N/A;   HOLEP-LASER ENUCLEATION OF THE PROSTATE WITH MORCELLATION N/A  01/24/2020   Procedure: HOLEP-LASER ENUCLEATION OF THE PROSTATE WITH MORCELLATION;  Surgeon: Billey Co, MD;  Location: [facility];  Service: Urology;  Laterality: N/A;   MEDIAL COLLATERAL LIGAMENT REPAIR, KNEE  1999   SHOULDER ARTHROSCOPY WITH ROTATOR CUFF REPAIR Right 10/01/2014   Procedure: SHOULDER ARTHROSCOPY WITH ROTATOR CUFF REPAIR;  Surgeon: Earnestine Leys, MD;  Location: [facility];  Service: Orthopedics;  Laterality: Right;  Mini open rotator cuff repair Arthroscopy  Distal clavicle excision Subacromial  decompression   Social History   Socioeconomic History   Marital status: Married    Spouse name: Not on file   Number of children: Not on file   Years of education: Not on file   Highest education level: Not on file  Occupational History   Occupation: Radiographer, therapeutic  Tobacco Use   Smoking status: Every Day    Packs/day: 1.00    Years: 15.00    Total pack years: 15.00    Types: Cigarettes   Smokeless tobacco: Never  Vaping Use   Vaping Use: Former  Substance and Sexual Activity   Alcohol use: Not Currently    Comment: Drinks every month or two   Drug use: No   Sexual activity: Yes  Other Topics Concern   Not on file  Social History Narrative   Not on file   Social Determinants of Health   Financial Resource Strain: Not on file  Food Insecurity: Not on file  Transportation Needs: Not on file  Physical Activity: Not on file  Stress: Not on file  Social Connections: Not on file  Intimate Partner Violence: Not on file   Family History  Problem Relation Age of Onset   Cancer Mother        thyroid   Cancer Father        lung   Thyroid disease Brother    Cancer Maternal Aunt        thyroid   Cancer Sister    Thyroid disease Brother    Cancer Sister    Current Outpatient Medications on File Prior to Visit  Medication Sig   amLODipine (NORVASC) 10 MG tablet TAKE 1 TABLET BY MOUTH EVERY DAY   fluticasone (FLONASE) 50 MCG/ACT nasal spray Place 2 sprays into both nostrils daily. Use for 4-6 weeks then stop and use seasonally or as needed.   hydrALAZINE (APRESOLINE) 25 MG tablet Take 1 tablet (25 mg total) by mouth in the morning and at bedtime.   levothyroxine (SYNTHROID) 175 MCG tablet Take 1 tablet (175 mcg total) by mouth daily before breakfast.   losartan (COZAAR) 100 MG tablet Take 1 tablet (100 mg total) by mouth daily.   montelukast (SINGULAIR) 10 MG tablet Take 1 tablet (10 mg total) by mouth at bedtime.   naproxen (NAPROSYN) 500 MG tablet Take 500 mg by mouth  2 (two) times daily with a meal. As needed   omeprazole (PRILOSEC) 40 MG capsule Take 1 capsule (40 mg total) by mouth daily before breakfast. 4-6 weeks then can taper down off med   rosuvastatin (CRESTOR) 10 MG tablet TAKE 1 TABLET BY MOUTH EVERYDAY AT BEDTIME   sildenafil (REVATIO) 20 MG tablet Take 1-5 pills about 60 min prior to sex. Start with 1 and increase as needed.   spironolactone (ALDACTONE) 25 MG tablet Take 1 tablet (25 mg total) by mouth daily.   triamcinolone cream (KENALOG) 0.1 % Apply 1 application topically 2 (two) times daily. As needed for bug bites or itching  No current facility-administered medications on file prior to visit.    Review of Systems  Constitutional:  Negative for activity change, appetite change, chills, diaphoresis, fatigue and fever.  HENT:  Negative for congestion and hearing loss.   Eyes:  Negative for visual disturbance.  Respiratory:  Negative for cough, chest tightness, shortness of breath and wheezing.   Cardiovascular:  Negative for chest pain, palpitations and leg swelling.  Gastrointestinal:  Negative for abdominal pain, constipation, diarrhea, nausea and vomiting.  Genitourinary:  Negative for dysuria, frequency and hematuria.  Musculoskeletal:  Positive for arthralgias. Negative for neck pain.  Skin:  Negative for rash.  Neurological:  Negative for dizziness, weakness, light-headedness, numbness and headaches.  Hematological:  Negative for adenopathy.  Psychiatric/Behavioral:  Negative for behavioral problems, dysphoric mood and sleep disturbance.    Per HPI unless specifically indicated above     Objective:    BP 138/72 (BP Location: Right Arm, Patient Position: Sitting)   Pulse 61   Ht '5\' 7"'$  (1.702 m)   Wt 213 lb 14.4 oz (97 kg)   SpO2 100%   BMI 33.50 kg/m   Wt Readings from Last 3 Encounters:  05/10/22 213 lb 14.4 oz (97 kg)  11/03/21 212 lb 12.8 oz (96.5 kg)  04/29/21 219 lb 12.8 oz (99.7 kg)    Physical Exam Vitals and  nursing note reviewed.  Constitutional:      General: He is not in acute distress.    Appearance: He is well-developed. He is not diaphoretic.     Comments: Well-appearing, comfortable, cooperative  HENT:     Head: Normocephalic and atraumatic.  Eyes:     General:        Right eye: No discharge.        Left eye: No discharge.     Conjunctiva/sclera: Conjunctivae normal.     Pupils: Pupils are equal, round, and reactive to light.  Neck:     Thyroid: No thyromegaly.  Cardiovascular:     Rate and Rhythm: Normal rate and regular rhythm.     Pulses: Normal pulses.     Heart sounds: Normal heart sounds. No murmur heard. Pulmonary:     Effort: Pulmonary effort is normal. No respiratory distress.     Breath sounds: Normal breath sounds. No wheezing or rales.  Abdominal:     General: Bowel sounds are normal. There is no distension.     Palpations: Abdomen is soft. There is no mass.     Tenderness: There is no abdominal tenderness.  Musculoskeletal:        General: No tenderness. Normal range of motion.     Cervical back: Normal range of motion and neck supple.     Comments: Upper / Lower Extremities: - Normal muscle tone, strength bilateral upper extremities 5/5, lower extremities 5/5  Lymphadenopathy:     Cervical: No cervical adenopathy.  Skin:    General: Skin is warm and dry.     Findings: No erythema or rash.  Neurological:     Mental Status: He is alert and oriented to person, place, and time.     Comments: Distal sensation intact to light touch all extremities  Psychiatric:        Mood and Affect: Mood normal.        Behavior: Behavior normal.        Thought Content: Thought content normal.     Comments: Well groomed, good eye contact, normal speech and thoughts      Results for  orders placed or performed in visit on 05/02/22  T4, free  Result Value Ref Range   Free T4 1.1 0.8 - 1.8 ng/dL  TSH  Result Value Ref Range   TSH 14.39 (H) 0.40 - 4.50 mIU/L  PSA  Result  Value Ref Range   PSA 0.41 < OR = 4.00 ng/mL  Hemoglobin A1c  Result Value Ref Range   Hgb A1c MFr Bld 6.0 (H) <5.7 % of total Hgb   Mean Plasma Glucose 126 mg/dL   eAG (mmol/L) 7.0 mmol/L  Lipid panel  Result Value Ref Range   Cholesterol 235 (H) <200 mg/dL   HDL 45 > OR = 40 mg/dL   Triglycerides 87 <150 mg/dL   LDL Cholesterol (Calc) 170 (H) mg/dL (calc)   Total CHOL/HDL Ratio 5.2 (H) <5.0 (calc)   Non-HDL Cholesterol (Calc) 190 (H) <130 mg/dL (calc)  CBC with Differential/Platelet  Result Value Ref Range   WBC 6.2 3.8 - 10.8 Thousand/uL   RBC 4.77 4.20 - 5.80 Million/uL   Hemoglobin 13.7 13.2 - 17.1 g/dL   HCT 40.2 38.5 - 50.0 %   MCV 84.3 80.0 - 100.0 fL   MCH 28.7 27.0 - 33.0 pg   MCHC 34.1 32.0 - 36.0 g/dL   RDW 15.7 (H) 11.0 - 15.0 %   Platelets 183 140 - 400 Thousand/uL   MPV 11.6 7.5 - 12.5 fL   Neutro Abs 3,112 1,500 - 7,800 cells/uL   Lymphs Abs 2,257 850 - 3,900 cells/uL   Absolute Monocytes 639 200 - 950 cells/uL   Eosinophils Absolute 112 15 - 500 cells/uL   Basophils Absolute 81 0 - 200 cells/uL   Neutrophils Relative % 50.2 %   Total Lymphocyte 36.4 %   Monocytes Relative 10.3 %   Eosinophils Relative 1.8 %   Basophils Relative 1.3 %  COMPLETE METABOLIC PANEL WITH GFR  Result Value Ref Range   Glucose, Bld 95 65 - 99 mg/dL   BUN 12 7 - 25 mg/dL   Creat 1.15 0.70 - 1.35 mg/dL   eGFR 72 > OR = 60 mL/min/1.79m   BUN/Creatinine Ratio SEE NOTE: 6 - 22 (calc)   Sodium 140 135 - 146 mmol/L   Potassium 4.0 3.5 - 5.3 mmol/L   Chloride 108 98 - 110 mmol/L   CO2 25 20 - 32 mmol/L   Calcium 8.9 8.6 - 10.3 mg/dL   Total Protein 7.1 6.1 - 8.1 g/dL   Albumin 4.1 3.6 - 5.1 g/dL   Globulin 3.0 1.9 - 3.7 g/dL (calc)   AG Ratio 1.4 1.0 - 2.5 (calc)   Total Bilirubin 0.6 0.2 - 1.2 mg/dL   Alkaline phosphatase (APISO) 50 35 - 144 U/L   AST 20 10 - 35 U/L   ALT 8 (L) 9 - 46 U/L      Assessment & Plan:   Problem List Items Addressed This Visit     BPH (benign  prostatic hyperplasia)   Chronic pain of right knee   Relevant Orders   Ambulatory referral to Orthopedic Surgery   Essential hypertension    Improved BP on repeat No known complications Suspect untreated OSA underlying and or smoking secondary causes of HTN worsening Has not pursued sleep study yet due to financial / barriers  Plan:  1. Continue current - Losartan '100mg'$  daily, Amlodipine '10mg'$  daily, Spironolactone '25mg'$  daily, Hydralazine 25 TWICE A DAY - renew meds 2. Encourage improved lifestyle - low sodium diet, regular exercise 3. Keep weekly monitor  BP outside office, bring readings to next visit, if persistently >140/90 or new symptoms notify office sooner      Gastroesophageal reflux disease    Continue Omeprazole '40mg'$  daily      Hypothyroidism    Labs show still elevated TSH but normalized T4 Continue current Levothyroxine 131mg daily      Mixed hyperlipidemia    Uncontrolled cholesterol LDL 170 The 10-year ASCVD risk score (Arnett DK, et al., 2019) is: 28.7%  Plan: 1. RESTART Statin - Counseling on ASCVD risk / hyperlipidemia - START Rosuvastatin '10mg'$  nightly, counseling on benefit risk side effect, dose titration 2. Encourage improved lifestyle - low carb/cholesterol, reduce portion size, continue improving regular exercise      Obesity (BMI 30.0-34.9)   Pre-diabetes    A1c up to 6.0 mild increase Concern with obesity, HTN, HLD  Plan: 1. Not on any therapy currently 2. Encourage improved lifestyle - low carb, low sugar diet, switch to diet, reduce portion size, continue improving regular exercise      Primary osteoarthritis of right knee    See A&P      Relevant Orders   Ambulatory referral to Orthopedic Surgery   Prostate cancer ([facility]    Previously managed by Urology S/p HOLEP Single core biopsy very low grade prostate cancer Surveillance on PSA Last lab 04/2022, negative low      Other Visit Diagnoses     Annual physical exam    -  Primary    Trigger middle finger of right hand       History of tear of meniscus of knee joint       Relevant Orders   Ambulatory referral to Orthopedic Surgery      Updated Health Maintenance information Reviewed recent lab results with patient Encouraged improvement to lifestyle with diet and exercise Goal of weight loss    #R knee OA/DJD / Chronic Pain / History of meniscus tear Referral to Orthopedic for the Right Knee  [facility] Address: [address] [place] [address]Hours:  9AM-5PM Phone: [phone] Future for the Right hand Trigger Finger / Arthritis, we can also refer to the Hand specialist at [facility] - but wait until you are ready after the Knee   For smoking cessation, free program 1 800-QUIT NOW   No orders of the defined types were placed in this encounter.    Follow up plan: Return in about 6 months (around 11/10/2022) for 6 month fasting lab then 1 week later Follow up Thyroid PreDM, HTN, Updates R Knee Ortho.  Future labs 11/03/22 TSH T4 A1c Lipid  ANobie Putnam DO [facility] 05/10/2022, 8:51 AM

## 2022-05-10 NOTE — Assessment & Plan Note (Signed)
Uncontrolled cholesterol LDL 170 The 10-year ASCVD risk score (Arnett DK, et al., 2019) is: 28.7%  Plan: 1. RESTART Statin - Counseling on ASCVD risk / hyperlipidemia - START Rosuvastatin '10mg'$  nightly, counseling on benefit risk side effect, dose titration 2. Encourage improved lifestyle - low carb/cholesterol, reduce portion size, continue improving regular exercise

## 2022-05-10 NOTE — Assessment & Plan Note (Signed)
Improved BP on repeat No known complications Suspect untreated OSA underlying and or smoking secondary causes of HTN worsening Has not pursued sleep study yet due to financial / barriers  Plan:  1. Continue current - Losartan '100mg'$  daily, Amlodipine '10mg'$  daily, Spironolactone '25mg'$  daily, Hydralazine 25 TWICE A DAY - renew meds 2. Encourage improved lifestyle - low sodium diet, regular exercise 3. Keep weekly monitor BP outside office, bring readings to next visit, if persistently >140/90 or new symptoms notify office sooner

## 2022-05-10 NOTE — Assessment & Plan Note (Signed)
Labs show still elevated TSH but normalized T4 Continue current Levothyroxine 125mg daily

## 2022-05-10 NOTE — Assessment & Plan Note (Signed)
Continue Omeprazole '40mg'$  daily

## 2022-11-03 ENCOUNTER — Other Ambulatory Visit: Payer: 59

## 2022-11-03 DIAGNOSIS — R7303 Prediabetes: Secondary | ICD-10-CM

## 2022-11-03 DIAGNOSIS — E039 Hypothyroidism, unspecified: Secondary | ICD-10-CM

## 2022-11-03 DIAGNOSIS — E782 Mixed hyperlipidemia: Secondary | ICD-10-CM

## 2022-11-04 LAB — LIPID PANEL
Cholesterol: 203 mg/dL — ABNORMAL HIGH (ref <200)
HDL: 46 mg/dL (ref >=40)
LDL Cholesterol (Calc): 136 mg/dL — ABNORMAL HIGH
Non-HDL Cholesterol (Calc): 157 mg/dL — ABNORMAL HIGH (ref <130)
Total CHOL/HDL Ratio: 4.4 (calc) (ref <5.0)
Triglycerides: 106 mg/dL (ref <150)

## 2022-11-04 LAB — HEMOGLOBIN A1C
Hgb A1c MFr Bld: 6 %{Hb} — ABNORMAL HIGH (ref <5.7)
Mean Plasma Glucose: 126 mg/dL
eAG (mmol/L): 7 mmol/L

## 2022-11-04 LAB — T4, FREE: Free T4: 1.3 ng/dL (ref 0.8–1.8)

## 2022-11-04 LAB — TSH: TSH: 15.32 m[IU]/L — ABNORMAL HIGH (ref 0.40–4.50)

## 2022-11-10 ENCOUNTER — Other Ambulatory Visit: Payer: Self-pay | Admitting: Family Medicine

## 2022-11-10 ENCOUNTER — Encounter: Payer: Self-pay | Admitting: Family Medicine

## 2022-11-10 ENCOUNTER — Ambulatory Visit: Payer: 59 | Admitting: Family Medicine

## 2022-11-10 VITALS — BP 152/84 | HR 59 | Ht 67.0 in | Wt 206.0 lb

## 2022-11-10 DIAGNOSIS — E039 Hypothyroidism, unspecified: Secondary | ICD-10-CM

## 2022-11-10 DIAGNOSIS — I10 Essential (primary) hypertension: Secondary | ICD-10-CM

## 2022-11-10 DIAGNOSIS — E782 Mixed hyperlipidemia: Secondary | ICD-10-CM

## 2022-11-10 DIAGNOSIS — I422 Other hypertrophic cardiomyopathy: Secondary | ICD-10-CM

## 2022-11-10 DIAGNOSIS — N401 Enlarged prostate with lower urinary tract symptoms: Secondary | ICD-10-CM

## 2022-11-10 DIAGNOSIS — R3911 Hesitancy of micturition: Secondary | ICD-10-CM

## 2022-11-10 DIAGNOSIS — R7303 Prediabetes: Secondary | ICD-10-CM

## 2022-11-10 DIAGNOSIS — C61 Malignant neoplasm of prostate: Secondary | ICD-10-CM

## 2022-11-10 DIAGNOSIS — Z0000 Encounter for general adult medical examination without abnormal findings: Secondary | ICD-10-CM

## 2022-11-10 DIAGNOSIS — Z23 Encounter for immunization: Secondary | ICD-10-CM | POA: Diagnosis not present

## 2022-11-10 MED ORDER — SPIRONOLACTONE 25 MG PO TABS
25.0000 mg | ORAL_TABLET | Freq: Every day | ORAL | 3 refills | Status: AC
Start: 2022-11-10 — End: ?

## 2022-11-10 MED ORDER — HYDRALAZINE HCL 25 MG PO TABS
25.0000 mg | ORAL_TABLET | Freq: Two times a day (BID) | ORAL | 3 refills | Status: AC
Start: 2022-11-10 — End: ?

## 2022-11-10 MED ORDER — AMLODIPINE BESYLATE 10 MG PO TABS
10.0000 mg | ORAL_TABLET | Freq: Every day | ORAL | 3 refills | Status: AC
Start: 2022-11-10 — End: ?

## 2022-11-10 MED ORDER — LEVOTHYROXINE SODIUM 175 MCG PO TABS: 175.0000 ug | ORAL_TABLET | Freq: Every day | ORAL | 3 refills | Status: AC

## 2022-11-10 MED ORDER — ROSUVASTATIN CALCIUM 10 MG PO TABS
10.0000 mg | ORAL_TABLET | Freq: Every day | ORAL | 3 refills | Status: AC
Start: 2022-11-10 — End: ?

## 2022-11-10 MED ORDER — LOSARTAN POTASSIUM 100 MG PO TABS: 100.0000 mg | ORAL_TABLET | Freq: Every day | ORAL | 3 refills | Status: DC

## 2022-11-10 NOTE — Assessment & Plan Note (Signed)
Previously managed by Urology S/p HOLEP Single core biopsy very low grade prostate cancer Surveillance on PSA Last lab 04/2022, negative low  Continue surveillance on PSA

## 2022-11-10 NOTE — Assessment & Plan Note (Addendum)
Followed by Cardiology Continue to discuss BP management see A&P  He should return to Cardiology to re-consider the Cardiac MRI / structural eval, we considered Coronary Calcium Score, however patient already has known cardiovascular disease and is on or should resume Statin. There may not be as much utility in Calcium Score

## 2022-11-10 NOTE — Assessment & Plan Note (Signed)
Labs show still elevated TSH but normalized T4 Continue current Levothyroxine 125mg daily

## 2022-11-10 NOTE — Assessment & Plan Note (Signed)
Improved LDL 130s, not at goal. < 70 The 10-year ASCVD risk score (Arnett DK, et al., 2019) is: 33.1%  Plan: 1. RESTART Statin again today./ Counseling on ASCVD risk / hyperlipidemia - START Rosuvastatin 10mg  nightly, counseling on benefit risk side effect, dose titration 2. Encourage improved lifestyle - low carb/cholesterol, reduce portion size, continue improving regular exercise

## 2022-11-10 NOTE — Assessment & Plan Note (Signed)
A1c stable at 6.0 Concern with obesity, HTN, HLD  Plan: 1. Not on any therapy currently 2. Encourage improved lifestyle - low carb, low sugar diet, switch to diet, reduce portion size, continue improving regular exercise

## 2022-11-10 NOTE — Progress Notes (Signed)
Subjective:    Patient ID: Todd Burns, male    DOB: [birth year], 62 y.o.   MRN: [id number]  Todd Burns is a 62 y.o. male presenting on 11/10/2022 for Hypertension  HPI  CHRONIC HTN: Suspected OSA - unable to do PSG Cardiology - deferred BB due to bradyacrdia. They considered more potent ARB. Medications: = Spironolactone 25mg  daily, Amlodipine 10mg  daily, Losartan 100mg  daily, Hydralazine 25 BID Reports good compliance, took meds today. Tolerating well, w/o complaints.  Admits some life stressors still present Denies CP, dyspnea, HA, edema, dizziness / lightheadedness   HYPERLIPIDEMIA: - Reports no concerns. Last lipid panel 10/2022 LDL down to 130s from 170s Not on med regularly - Currently taking Rosuvastatin 10mg  but not adhering regularly at all needs new order  Pre-Diabetes: He has tried scale back on junk foods. Stable A1c up to 6.0, last reading 6.0 Prior 5.7 to 5.8 Meds: none Currently on ARB Denies hypoglycemia, polyuria, visual changes, numbness or tingling.   Low Risk Prostate Cancer BPH Erectile dysfunction  Followed by Urology, prior biopsy with low risk prostate cancer Monitoring PSA trend Last result 0.41 (previously 0.83, past history PSA 3-4 range 2-4 yr ago) No fam history prostate CA   Hypothyroidism Chronic problem, TSH still elevated 15 range, but Free T4 1.3 which is in the normal range. He does not have any new symptoms regarding this. Continues Levothyroxine daily   OSA Has not completed sleep study Not ready to pursue   Right hand Osteoarthritis / R Middle Finger Trigger Episodic flares with pain difficulty with stiffness pain in hand fingers R middle finger sticks and locks with triggering episodes. Repetitive motions at work   Follow-up Right Knee Pain / Torn Meniscus Followed by [facility], history of torn R meniscus, has deferred surgery Still has episodic pain R knee - He has prior history of Right knee surgical repair of  MCL and possibly meniscus injury performed years ago, 1999 on chart review Able to ambulate without assistance ready for referral to [facility]   Tobacco Abuse Smokes during break time If he is at home and relaxing will smoke more  Bilateral Hips and Shoulders Reports aching and arthritis multiple joints Describes Left shoulder has specific area of pain      11/10/2022    8:22 AM 05/10/2022    8:46 AM 11/03/2021    9:24 AM  Depression screen PHQ 2/9  Decreased Interest 0 0 0  Down, Depressed, Hopeless 0 0 0  PHQ - 2 Score 0 0 0  Altered sleeping 0 0 1  Tired, decreased energy 0 0 0  Change in appetite 0 0 0  Feeling bad or failure about yourself  0 0 0  Trouble concentrating 0 0 0  Moving slowly or fidgety/restless 0 0 0  Suicidal thoughts 0 0 0  PHQ-9 Score 0 0 1  Difficult doing work/chores  Not difficult at all Not difficult at all    Social History   Tobacco Use   Smoking status: Every Day    Current packs/day: 1.00    Average packs/day: 1 pack/day for 15.0 years (15.0 ttl pk-yrs)    Types: Cigarettes   Smokeless tobacco: Never  Vaping Use   Vaping status: Former  Substance Use Topics   Alcohol use: Not Currently    Comment: Drinks every month or two   Drug use: No    Review of Systems Per HPI unless specifically indicated above     Objective:  BP (!) 152/84 (BP Location: Left Arm, Cuff Size: Normal)   Pulse (!) 59   Ht 5\' 7"  (1.702 m)   Wt 206 lb (93.4 kg)   SpO2 96%   BMI 32.26 kg/m   Wt Readings from Last 3 Encounters:  11/10/22 206 lb (93.4 kg)  05/10/22 213 lb 14.4 oz (97 kg)  11/03/21 212 lb 12.8 oz (96.5 kg)    Physical Exam Vitals and nursing note reviewed.  Constitutional:      General: He is not in acute distress.    Appearance: He is well-developed. He is not diaphoretic.     Comments: Well-appearing, comfortable, cooperative  HENT:     Head: Normocephalic and atraumatic.  Eyes:     General:        Right eye: No  discharge.        Left eye: No discharge.     Conjunctiva/sclera: Conjunctivae normal.  Neck:     Thyroid: No thyromegaly.  Cardiovascular:     Rate and Rhythm: Normal rate and regular rhythm.     Pulses: Normal pulses.     Heart sounds: Normal heart sounds. No murmur heard. Pulmonary:     Effort: Pulmonary effort is normal. No respiratory distress.     Breath sounds: Normal breath sounds. No wheezing or rales.  Musculoskeletal:        General: Normal range of motion.     Cervical back: Normal range of motion and neck supple.  Lymphadenopathy:     Cervical: No cervical adenopathy.  Skin:    General: Skin is warm and dry.     Findings: No erythema or rash.  Neurological:     Mental Status: He is alert and oriented to person, place, and time. Mental status is at baseline.  Psychiatric:        Behavior: Behavior normal.     Comments: Well groomed, good eye contact, normal speech and thoughts       Results for orders placed or performed in visit on 11/03/22  T4, free  Result Value Ref Range   Free T4 1.3 0.8 - 1.8 ng/dL  TSH  Result Value Ref Range   TSH 15.32 (H) 0.40 - 4.50 mIU/L  Hemoglobin A1c  Result Value Ref Range   Hgb A1c MFr Bld 6.0 (H) <5.7 % of total Hgb   Mean Plasma Glucose 126 mg/dL   eAG (mmol/L) 7.0 mmol/L  Lipid panel  Result Value Ref Range   Cholesterol 203 (H) <200 mg/dL   HDL 46 > OR = 40 mg/dL   Triglycerides 161 <096 mg/dL   LDL Cholesterol (Calc) 136 (H) mg/dL (calc)   Total CHOL/HDL Ratio 4.4 <5.0 (calc)   Non-HDL Cholesterol (Calc) 157 (H) <130 mg/dL (calc)      Assessment & Plan:   Problem List Items Addressed This Visit     BPH (benign prostatic hyperplasia)   Essential hypertension - Primary    Elevated BP again Hypertrophic Cardiomyopathy Suspect untreated OSA underlying and or smoking secondary causes of HTN worsening Has not pursued sleep study yet due to financial / barriers  Plan:  1. Continue current - Losartan 100mg   daily, Amlodipine 10mg  daily, Spironolactone 25mg  daily, Hydralazine 25 TWICE A DAY - renew meds  Advise patient to contact f/u with Cardiology again for further assistance with BP  2. Encourage improved lifestyle - low sodium diet, regular exercise 3. Keep weekly monitor BP outside office, bring readings to next visit, if persistently >140/90 or new  symptoms notify office sooner      Relevant Medications   rosuvastatin (CRESTOR) 10 MG tablet   spironolactone (ALDACTONE) 25 MG tablet   losartan (COZAAR) 100 MG tablet   hydrALAZINE (APRESOLINE) 25 MG tablet   amLODipine (NORVASC) 10 MG tablet   Hypertrophic cardiomyopathy (HCC)    Followed by Cardiology Continue to discuss BP management see A&P  He should return to Cardiology to re-consider the Cardiac MRI / structural eval, we considered Coronary Calcium Score, however patient already has known cardiovascular disease and is on or should resume Statin. There may not be as much utility in Calcium Score      Relevant Medications   rosuvastatin (CRESTOR) 10 MG tablet   spironolactone (ALDACTONE) 25 MG tablet   losartan (COZAAR) 100 MG tablet   hydrALAZINE (APRESOLINE) 25 MG tablet   amLODipine (NORVASC) 10 MG tablet   Hypothyroidism    Labs show still elevated TSH but normalized T4 Continue current Levothyroxine daily      Relevant Medications   levothyroxine (SYNTHROID) 175 MCG tablet   Mixed hyperlipidemia    Improved LDL 130s, not at goal. < 70 The 10-year ASCVD risk score (Arnett DK, et al., 2019) is: 33.1%  Plan: 1. RESTART Statin again today./ Counseling on ASCVD risk / hyperlipidemia - START Rosuvastatin 10mg  nightly, counseling on benefit risk side effect, dose titration 2. Encourage improved lifestyle - low carb/cholesterol, reduce portion size, continue improving regular exercise      Relevant Medications   rosuvastatin (CRESTOR) 10 MG tablet   spironolactone (ALDACTONE) 25 MG tablet   losartan (COZAAR) 100  MG tablet   hydrALAZINE (APRESOLINE) 25 MG tablet   amLODipine (NORVASC) 10 MG tablet   Pre-diabetes    A1c stable at 6.0 Concern with obesity, HTN, HLD  Plan: 1. Not on any therapy currently 2. Encourage improved lifestyle - low carb, low sugar diet, switch to diet, reduce portion size, continue improving regular exercise      Prostate cancer [facility])    Previously managed by Urology S/p HOLEP Single core biopsy very low grade prostate cancer Surveillance on PSA Last lab 04/2022, negative low  Continue surveillance on PSA      Other Visit Diagnoses     Needs flu shot       Relevant Orders   Flu vaccine trivalent PF, 6mos and older(Flulaval,Afluria,Fluarix,Fluzone) (Completed)          Meds ordered this encounter  Medications   rosuvastatin (CRESTOR) 10 MG tablet    Sig: Take 1 tablet (10 mg total) by mouth at bedtime.    Dispense:  90 tablet    Refill:  3   spironolactone (ALDACTONE) 25 MG tablet    Sig: Take 1 tablet (25 mg total) by mouth daily.    Dispense:  90 tablet    Refill:  3   losartan (COZAAR) 100 MG tablet    Sig: Take 1 tablet (100 mg total) by mouth daily.    Dispense:  90 tablet    Refill:  3   levothyroxine (SYNTHROID) 175 MCG tablet    Sig: Take 1 tablet (175 mcg total) by mouth daily before breakfast.    Dispense:  90 tablet    Refill:  3   hydrALAZINE (APRESOLINE) 25 MG tablet    Sig: Take 1 tablet (25 mg total) by mouth in the morning and at bedtime.    Dispense:  180 tablet    Refill:  3   amLODipine (NORVASC) 10 MG  tablet    Sig: Take 1 tablet (10 mg total) by mouth daily.    Dispense:  90 tablet    Refill:  3     Follow up plan: Return in about 6 months (around 05/10/2023) for 6 month fasting lab only then 1 week later Annual Physical (early AM preferred).  Future labs ordered for 05/10/23   Saralyn Pilar, DO [facility] 11/10/2022, 8:43 AM

## 2022-11-10 NOTE — Patient Instructions (Addendum)
Thank you for coming to the office today.  Restart the Rosuvastatin cholesterol medication.  Call up Cardiology to follow-up on heart imaging MRI / scan from Dr [facility]) HeartCare at [facility] [address] [place], [place] [address] Main: [phone]   Recommend to start taking Tylenol Extra Strength 500mg  tabs - take 1 to 2 tabs per dose (max 1000mg ) every 6-8 hours for pain (take regularly, don't skip a dose for next 7 days), max 24 hour daily dose is 6 tablets or 3000mg . In the future you can repeat the same everyday Tylenol course for 1-2 weeks at a time.   START anti inflammatory topical - OTC Voltaren (generic Diclofenac) topical 2-4 times a day as needed for pain swelling of affected joint for 1-2 weeks or longer.  DUE for FASTING BLOOD WORK (no food or drink after midnight before the lab appointment, only water or coffee without cream/sugar on the morning of)  SCHEDULE "Lab Only" visit in the morning at the clinic for lab draw in 6 MONTHS   - Make sure Lab Only appointment is at about 1 week before your next appointment, so that results will be available  For Lab Results, once available within 2-3 days of blood draw, you can can log in to MyChart online to view your results and a brief explanation. Also, we can discuss results at next follow-up visit.    Please schedule a Follow-up Appointment to: Return in about 6 months (around 05/10/2023) for 6 month fasting lab only then 1 week later Annual Physical (early AM preferred).  If you have any other questions or concerns, please feel free to call the office or send a message through MyChart. You may also schedule an earlier appointment if necessary.  Additionally, you may be receiving a survey about your experience at our office within a few days to 1 week by e-mail or mail. We value your feedback.  Saralyn Pilar, DO [facility], [place]

## 2022-11-10 NOTE — Assessment & Plan Note (Signed)
Elevated BP again Hypertrophic Cardiomyopathy Suspect untreated OSA underlying and or smoking secondary causes of HTN worsening Has not pursued sleep study yet due to financial / barriers  Plan:  1. Continue current - Losartan 100mg  daily, Amlodipine 10mg  daily, Spironolactone 25mg  daily, Hydralazine 25 TWICE A DAY - renew meds  Advise patient to contact f/u with Cardiology again for further assistance with BP  2. Encourage improved lifestyle - low sodium diet, regular exercise 3. Keep weekly monitor BP outside office, bring readings to next visit, if persistently >140/90 or new symptoms notify office sooner

## 2023-05-10 ENCOUNTER — Other Ambulatory Visit: Payer: Self-pay

## 2023-05-10 DIAGNOSIS — N401 Enlarged prostate with lower urinary tract symptoms: Secondary | ICD-10-CM

## 2023-05-10 DIAGNOSIS — R7303 Prediabetes: Secondary | ICD-10-CM

## 2023-05-10 DIAGNOSIS — E039 Hypothyroidism, unspecified: Secondary | ICD-10-CM

## 2023-05-10 DIAGNOSIS — I10 Essential (primary) hypertension: Secondary | ICD-10-CM

## 2023-05-10 DIAGNOSIS — Z0000 Encounter for general adult medical examination without abnormal findings: Secondary | ICD-10-CM

## 2023-05-10 DIAGNOSIS — E782 Mixed hyperlipidemia: Secondary | ICD-10-CM

## 2023-05-19 ENCOUNTER — Encounter: Payer: Self-pay | Admitting: Family Medicine

## 2023-11-18 ENCOUNTER — Other Ambulatory Visit: Payer: Self-pay | Admitting: Family Medicine

## 2023-11-18 DIAGNOSIS — I10 Essential (primary) hypertension: Secondary | ICD-10-CM

## 2023-11-21 NOTE — Telephone Encounter (Signed)
 Unable to refill per protocol, appointment needed.   Requested Prescriptions  Pending Prescriptions Disp Refills   amLODipine  (NORVASC ) 10 MG tablet [Pharmacy Med Name: AMLODIPINE  BESYLATE 10 MG TAB] 90 tablet 3    Sig: TAKE 1 TABLET BY MOUTH EVERY DAY     Cardiovascular: Calcium  Channel Blockers 2 Failed - 11/21/2023  8:14 AM      Failed - Last BP in normal range    BP Readings from Last 1 Encounters:  11/10/22 (!) 152/84         Failed - Valid encounter within last 6 months    Recent Outpatient Visits   None            Passed - Last Heart Rate in normal range    Pulse Readings from Last 1 Encounters:  11/10/22 (!) 59

## 2023-11-21 NOTE — Telephone Encounter (Signed)
 Pt needs appointment

## 2024-02-03 ENCOUNTER — Other Ambulatory Visit: Payer: Self-pay | Admitting: Family Medicine

## 2024-02-03 DIAGNOSIS — I10 Essential (primary) hypertension: Secondary | ICD-10-CM

## 2024-02-06 ENCOUNTER — Other Ambulatory Visit: Payer: Self-pay | Admitting: Family Medicine

## 2024-02-06 DIAGNOSIS — E039 Hypothyroidism, unspecified: Secondary | ICD-10-CM

## 2024-02-06 DIAGNOSIS — I10 Essential (primary) hypertension: Secondary | ICD-10-CM

## 2024-02-07 NOTE — Telephone Encounter (Signed)
 Courtesy refill. Left message to make appointment. Requested Prescriptions  Pending Prescriptions Disp Refills   losartan  (COZAAR ) 100 MG tablet [Pharmacy Med Name: LOSARTAN  POTASSIUM 100 MG TAB] 90 tablet 3    Sig: TAKE 1 TABLET BY MOUTH EVERY DAY     Cardiovascular:  Angiotensin Receptor Blockers Failed - 02/07/2024 10:48 AM      Failed - Cr in normal range and within 180 days    Creat  Date Value Ref Range Status  05/03/2022 1.15 0.70 - 1.35 mg/dL Final         Failed - K in normal range and within 180 days    Potassium  Date Value Ref Range Status  05/03/2022 4.0 3.5 - 5.3 mmol/L Final         Failed - Last BP in normal range    BP Readings from Last 1 Encounters:  11/10/22 (!) 152/84         Failed - Valid encounter within last 6 months    Recent Outpatient Visits   None            Passed - Patient is not pregnant

## 2024-02-08 NOTE — Telephone Encounter (Signed)
 Requested medication (s) are due for refill today: Yes  Requested medication (s) are on the active medication list: Yes  Last refill:    Future visit scheduled: No  Notes to clinic:  Left message to make appointment.    Requested Prescriptions  Pending Prescriptions Disp Refills   levothyroxine  (SYNTHROID ) 175 MCG tablet [Pharmacy Med Name: LEVOTHYROXINE  175 MCG TABLET] 90 tablet 3    Sig: TAKE 1 TABLET BY MOUTH DAILY BEFORE BREAKFAST.     Endocrinology:  Hypothyroid Agents Failed - 02/08/2024  3:51 PM      Failed - TSH in normal range and within 360 days    TSH  Date Value Ref Range Status  11/03/2022 15.32 (H) 0.40 - 4.50 mIU/L Final         Failed - Valid encounter within last 12 months    Recent Outpatient Visits   None             spironolactone  (ALDACTONE ) 25 MG tablet [Pharmacy Med Name: SPIRONOLACTONE  25 MG TABLET] 90 tablet 3    Sig: Take 1 tablet (25 mg total) by mouth daily.     Cardiovascular: Diuretics - Aldosterone Antagonist Failed - 02/08/2024  3:51 PM      Failed - Cr in normal range and within 180 days    Creat  Date Value Ref Range Status  05/03/2022 1.15 0.70 - 1.35 mg/dL Final         Failed - K in normal range and within 180 days    Potassium  Date Value Ref Range Status  05/03/2022 4.0 3.5 - 5.3 mmol/L Final         Failed - Na in normal range and within 180 days    Sodium  Date Value Ref Range Status  05/03/2022 140 135 - 146 mmol/L Final  04/30/2020 139 134 - 144 mmol/L Final         Failed - eGFR is 30 or above and within 180 days    GFR, Est African American  Date Value Ref Range Status  02/05/2020 76 > OR = 60 mL/min/1.67m2 Final   GFR calc Af Amer  Date Value Ref Range Status  04/30/2020 93 >59 mL/min/1.73 Final    Comment:    **In accordance with recommendations from the NKF-ASN Task force,**   [facility] is in the process of updating its eGFR calculation to the   2021 CKD-EPI creatinine equation that estimates kidney  function   without a race variable.    GFR, Est Non African American  Date Value Ref Range Status  02/05/2020 65 > OR = 60 mL/min/1.67m2 Final   GFR, Estimated  Date Value Ref Range Status  03/11/2020 >60 >60 mL/min Final    Comment:    (NOTE) Calculated using the CKD-EPI Creatinine Equation (2021)    GFR calc non Af Amer  Date Value Ref Range Status  04/30/2020 80 >59 mL/min/1.73 Final   eGFR  Date Value Ref Range Status  05/03/2022 72 > OR = 60 mL/min/1.64m2 Final         Failed - Last BP in normal range    BP Readings from Last 1 Encounters:  11/10/22 (!) 152/84         Failed - Valid encounter within last 6 months    Recent Outpatient Visits   None

## 2024-03-12 ENCOUNTER — Other Ambulatory Visit: Payer: Self-pay | Admitting: Family Medicine

## 2024-03-12 DIAGNOSIS — I10 Essential (primary) hypertension: Secondary | ICD-10-CM

## 2024-03-13 NOTE — Telephone Encounter (Signed)
 Courtesy refill given, appointment needed.   Requested Prescriptions  Pending Prescriptions Disp Refills   losartan  (COZAAR ) 100 MG tablet [Pharmacy Med Name: LOSARTAN  POTASSIUM 100 MG TAB] 90 tablet 1    Sig: TAKE 1 TABLET BY MOUTH EVERY DAY     Cardiovascular:  Angiotensin Receptor Blockers Failed - 03/13/2024  3:57 PM      Failed - Cr in normal range and within 180 days    Creat  Date Value Ref Range Status  05/03/2022 1.15 0.70 - 1.35 mg/dL Final         Failed - K in normal range and within 180 days    Potassium  Date Value Ref Range Status  05/03/2022 4.0 3.5 - 5.3 mmol/L Final         Failed - Last BP in normal range    BP Readings from Last 1 Encounters:  11/10/22 (!) 152/84         Failed - Valid encounter within last 6 months    Recent Outpatient Visits   None            Passed - Patient is not pregnant

## 2024-03-16 ENCOUNTER — Other Ambulatory Visit: Payer: Self-pay | Admitting: Family Medicine

## 2024-03-16 DIAGNOSIS — I10 Essential (primary) hypertension: Secondary | ICD-10-CM

## 2024-03-18 NOTE — Telephone Encounter (Signed)
 Requested medications are due for refill today.  yes  Requested medications are on the active medications list.  yes  Last refill. 02/07/2024 #30 0 rf  Future visit scheduled.   no  Notes to clinic.  Pt already given a courtesy refill. Pt is more than 3 months overdue for ov.    Requested Prescriptions  Pending Prescriptions Disp Refills   losartan  (COZAAR ) 100 MG tablet [Pharmacy Med Name: LOSARTAN  POTASSIUM 100 MG TAB] 30 tablet 0    Sig: TAKE 1 TABLET BY MOUTH EVERY DAY     Cardiovascular:  Angiotensin Receptor Blockers Failed - 03/18/2024  3:59 PM      Failed - Cr in normal range and within 180 days    Creat  Date Value Ref Range Status  05/03/2022 1.15 0.70 - 1.35 mg/dL Final         Failed - K in normal range and within 180 days    Potassium  Date Value Ref Range Status  05/03/2022 4.0 3.5 - 5.3 mmol/L Final         Failed - Last BP in normal range    BP Readings from Last 1 Encounters:  11/10/22 (!) 152/84         Failed - Valid encounter within last 6 months    Recent Outpatient Visits   None            Passed - Patient is not pregnant
